# Patient Record
Sex: Female | Born: 1970 | Hispanic: Yes | Marital: Single | Smoking: Current every day smoker
Health system: Southern US, Community
[De-identification: ages and names within clinical notes are randomized; demographics above are authoritative.]

## PROBLEM LIST (undated history)

## (undated) DIAGNOSIS — G43909 Migraine, unspecified, not intractable, without status migrainosus: Secondary | ICD-10-CM

## (undated) DIAGNOSIS — E119 Type 2 diabetes mellitus without complications: Secondary | ICD-10-CM

## (undated) DIAGNOSIS — N6320 Unspecified lump in the left breast, unspecified quadrant: Secondary | ICD-10-CM

## (undated) DIAGNOSIS — E785 Hyperlipidemia, unspecified: Secondary | ICD-10-CM

## (undated) HISTORY — PX: FOOT GANGLION EXCISION: SHX1660

## (undated) HISTORY — DX: Type 2 diabetes mellitus without complications: E11.9

## (undated) HISTORY — DX: Hyperlipidemia, unspecified: E78.5

## (undated) HISTORY — PX: SPINAL CORD STIMULATOR IMPLANT: SHX2422

---

## 2017-05-28 ENCOUNTER — Encounter (HOSPITAL_COMMUNITY): Payer: Self-pay | Admitting: Emergency Medicine

## 2017-05-28 ENCOUNTER — Emergency Department (HOSPITAL_COMMUNITY)
Admission: EM | Admit: 2017-05-28 | Discharge: 2017-05-28 | Disposition: A | Discharge status: Home / Self Care | Payer: 59 | Attending: Emergency Medicine | Admitting: Emergency Medicine

## 2017-05-28 DIAGNOSIS — N6321 Unspecified lump in the left breast, upper outer quadrant: Secondary | ICD-10-CM | POA: Diagnosis not present

## 2017-05-28 DIAGNOSIS — N63 Unspecified lump in unspecified breast: Secondary | ICD-10-CM

## 2017-05-28 DIAGNOSIS — Z79899 Other long term (current) drug therapy: Secondary | ICD-10-CM | POA: Diagnosis not present

## 2017-05-28 DIAGNOSIS — F17210 Nicotine dependence, cigarettes, uncomplicated: Secondary | ICD-10-CM | POA: Diagnosis not present

## 2017-05-28 DIAGNOSIS — N644 Mastodynia: Secondary | ICD-10-CM | POA: Diagnosis present

## 2017-05-28 NOTE — ED Provider Notes (Signed)
[facility] DEPT Provider Note   CSN: [id number] Arrival date & time: 05/28/17  0827     History   Chief Complaint Chief Complaint  Patient presents with  . breast knot  . Breast Pain    HPI Jody Duke is a 46 y.o. female with history of tobacco abuse presents to the ED for left, tender breast lump that she noticed 2 days ago. Has tried ibuprofen with minimal relief. Also states that she's had irregular menstrual cycles 1 year, states now she gets her period twice a month with heavy bleeding and clots. Denies skin changes, discharge from nipple, fevers, chills, trauma. No known breast medical problems. Reports great grandmother had breast cancer. She has an appointment with a primary care provider in 2 weeks. No previous mammograms.  HPI  History reviewed. No pertinent past medical history.  There are no active problems to display for this patient.   History reviewed. No pertinent surgical history.  OB History    No data available       Home Medications    Prior to Admission medications   Medication Sig Start Date End Date Taking? Authorizing Provider  ibuprofen (ADVIL,MOTRIN) 800 MG tablet Take 800 mg by mouth every 8 (eight) hours as needed for fever, headache, mild pain, moderate pain or cramping.   Yes [provider]    Family History No family history on file.  Social History Social History   Tobacco Use  . Smoking status: Current Every Day Smoker    Types: Cigarettes  . Smokeless tobacco: Never Used  Substance Use Topics  . Alcohol use: No    Frequency: Never  . Drug use: Not on file     Allergies   Percocet [oxycodone-acetaminophen]   Review of Systems Review of Systems  Musculoskeletal:       Left breast pain and lump  All other systems reviewed and are negative.    Physical Exam Updated Vital Signs BP 113/73 (BP Location: Right Arm)   Pulse 95   Temp 98.5 F (36.9 C) (Oral)   Resp 16    LMP 05/16/2017   SpO2 99%   Physical Exam  Constitutional: She is oriented to person, place, and time. She appears well-developed and well-nourished. No distress.  NAD.  HENT:  Head: Normocephalic and atraumatic.  Right Ear: External ear normal.  Left Ear: External ear normal.  Nose: Nose normal.  Eyes: Conjunctivae and EOM are normal. No scleral icterus.  Neck: Normal range of motion. Neck supple.  Cardiovascular: Normal rate, regular rhythm and normal heart sounds.  No murmur heard. Pulmonary/Chest: Effort normal and breath sounds normal. She has no wheezes. She exhibits tenderness.  focal tenderness to left breast at 12:00 to 3:00 o'clock Two palpable, nonmobile lumps measuring 3x3 cm and 5x6 cm to this area No overlaying or surrounding erythema, edema, fluctuance, ecchymosis, or peau d'orange   Musculoskeletal: Normal range of motion. She exhibits no deformity.  Neurological: She is alert and oriented to person, place, and time.  Skin: Skin is warm and dry. Capillary refill takes less than 2 seconds.  Psychiatric: She has a normal mood and affect. Her behavior is normal. Judgment and thought content normal.  Nursing note and vitals reviewed.    ED Treatments / Results  Labs (all labs ordered are listed, but only abnormal results are displayed) Labs Reviewed - No data to display  EKG  EKG Interpretation None       Radiology No results found.  Procedures Procedures (including critical care time)  Medications Ordered in ED Medications - No data to display   Initial Impression / Assessment and Plan / ED Course  I have reviewed the triage vital signs and the nursing notes.  Pertinent labs & imaging results that were available during my care of the patient were reviewed by me and considered in my medical decision making (see chart for details).    46 year old female with history of tobacco abuse and family history of breast cancer presents with left breast pain  and palpable lumps that she noticed 2 days ago. No fevers, chills, unexpected weight loss, trauma, nipple discharge. Has not had a mammogram before. Exam is reassuring, she has to tender, nonmobile masses to the left breast without overlying skin changes. History and exam not consistent with breast abscess or cellulitis. Also having irregular menstrual cycle x 1 year, question pre menopausal changes and/or fibrocystic changes. Malignancy also considered given family h/o, tobacco abuse. We'll discharge at this time with NSAIDs and close follow up with PCP. Discussed return precautions.   Final Clinical Impressions(s) / ED Diagnoses   Final diagnoses:  Breast pain, left  Breast lump or mass    ED Discharge Orders    None       Arlean Hopping 05/28/17 1611    Lacretia Leigh, MD 06/02/17 [phone]

## 2017-05-28 NOTE — Discharge Instructions (Signed)
The cause of your breast lumps is unclear. It could be a benign cyst, scar tissue, cancer.   You need an outpatient provider (usually primary care doctor) to order an mammogram, which can be done at the breast center.   Follow up with your primary care provider on December 10th as scheduled, she/he can order the mammogram.   If you would like a specific OBGYN provider you can go to the [facility] center and establish care there ,a provider there can also order an MRI or further imaging.   For pain, take 600 mg ibuprofen + 1000 mg tylenol every 8 hours.   Return to ED for fevers, chills, skin changes to your breast

## 2017-05-28 NOTE — ED Triage Notes (Signed)
Patient reports that she found knot that is hard in left breast and having lots of pain in left breast for past couple days. Reports that she just getting set up with new PCP and doesn't have appointment for while.

## 2017-06-09 ENCOUNTER — Ambulatory Visit: Payer: Self-pay | Admitting: Family Medicine

## 2017-06-16 ENCOUNTER — Other Ambulatory Visit: Payer: Self-pay

## 2017-06-16 ENCOUNTER — Encounter: Payer: Self-pay | Admitting: Family Medicine

## 2017-06-16 ENCOUNTER — Ambulatory Visit (INDEPENDENT_AMBULATORY_CARE_PROVIDER_SITE_OTHER): Payer: 59

## 2017-06-16 ENCOUNTER — Ambulatory Visit (INDEPENDENT_AMBULATORY_CARE_PROVIDER_SITE_OTHER): Payer: 59 | Admitting: Family Medicine

## 2017-06-16 VITALS — BP 140/82 | HR 78 | Temp 98.0°F | Resp 16 | Wt 207.0 lb

## 2017-06-16 DIAGNOSIS — Z124 Encounter for screening for malignant neoplasm of cervix: Secondary | ICD-10-CM | POA: Diagnosis not present

## 2017-06-16 DIAGNOSIS — Z114 Encounter for screening for human immunodeficiency virus [HIV]: Secondary | ICD-10-CM | POA: Diagnosis not present

## 2017-06-16 DIAGNOSIS — N921 Excessive and frequent menstruation with irregular cycle: Secondary | ICD-10-CM

## 2017-06-16 DIAGNOSIS — N6320 Unspecified lump in the left breast, unspecified quadrant: Secondary | ICD-10-CM | POA: Diagnosis not present

## 2017-06-16 DIAGNOSIS — M25472 Effusion, left ankle: Secondary | ICD-10-CM

## 2017-06-16 DIAGNOSIS — R030 Elevated blood-pressure reading, without diagnosis of hypertension: Secondary | ICD-10-CM | POA: Diagnosis not present

## 2017-06-16 DIAGNOSIS — Z13220 Encounter for screening for lipoid disorders: Secondary | ICD-10-CM | POA: Diagnosis not present

## 2017-06-16 DIAGNOSIS — Z0000 Encounter for general adult medical examination without abnormal findings: Secondary | ICD-10-CM

## 2017-06-16 DIAGNOSIS — Z23 Encounter for immunization: Secondary | ICD-10-CM

## 2017-06-16 DIAGNOSIS — N852 Hypertrophy of uterus: Secondary | ICD-10-CM | POA: Diagnosis not present

## 2017-06-16 DIAGNOSIS — Z131 Encounter for screening for diabetes mellitus: Secondary | ICD-10-CM | POA: Diagnosis not present

## 2017-06-16 DIAGNOSIS — M7989 Other specified soft tissue disorders: Secondary | ICD-10-CM | POA: Diagnosis not present

## 2017-06-16 LAB — POCT URINALYSIS DIP (MANUAL ENTRY)
Bilirubin, UA: NEGATIVE
Glucose, UA: NEGATIVE mg/dL
Leukocytes, UA: NEGATIVE
Nitrite, UA: NEGATIVE
Protein Ur, POC: 100 mg/dL — AB
Spec Grav, UA: >=1.03 — AB (ref 1.010–1.025)
Urobilinogen, UA: 0.2 E.U./dL
pH, UA: 6 (ref 5.0–8.0)

## 2017-06-16 LAB — POCT URINE PREGNANCY: Preg Test, Ur: NEGATIVE

## 2017-06-16 NOTE — Progress Notes (Signed)
Subjective:    Patient ID: Jody Duke, female    DOB: [birth year], 46 y.o.   MRN: [id number]  06/16/2017  Establish Care (pt would like to talk to the provider about some health concerns ) and Breast Pain    HPI This 46 y.o. female presents for Complete Physical Examination and to establish care.  Last physical:  Age 67 Pap smear:  Age 68; LMP ended this week. Mammogram: never Eye exam:  Overdue; having problems with readings Dental exam:  Working on dental issues currently.  DUB: bleeding twice per month; really heavy; last pap smear age 81s.  L breast masses: has appreciated two breast masses; no previous mammogram; no family history of breast cancer.  L ankl no previous injury to left ankle.  Has chronic swelling posteriorly.  Swelling is very hard to palpation.  No redness.  Has significant pain especially at the end of work shift.  BP Readings from Last 3 Encounters:  06/16/17 140/82  05/28/17 113/73   Wt Readings from Last 3 Encounters:  06/16/17 207 lb (93.9 kg)   Immunization History  Administered Date(s) Administered  . Influenza-Unspecified 03/31/2017  . Tdap 06/16/2017    Review of Systems  Constitutional: Negative for activity change, appetite change, chills, diaphoresis, fatigue, fever and unexpected weight change.  HENT: Negative for congestion, dental problem, drooling, ear discharge, ear pain, facial swelling, hearing loss, mouth sores, nosebleeds, postnasal drip, rhinorrhea, sinus pressure, sneezing, sore throat, tinnitus, trouble swallowing and voice change.   Eyes: Negative for photophobia, pain, discharge, redness, itching and visual disturbance.  Respiratory: Negative for apnea, cough, choking, chest tightness, shortness of breath, wheezing and stridor.   Cardiovascular: Negative for chest pain, palpitations and leg swelling.  Gastrointestinal: Negative for abdominal distention, abdominal pain, anal bleeding, blood in stool, constipation, diarrhea,  nausea, rectal pain and vomiting.  Endocrine: Negative for cold intolerance, heat intolerance, polydipsia, polyphagia and polyuria.  Genitourinary: Positive for menstrual problem. Negative for decreased urine volume, difficulty urinating, dyspareunia, dysuria, enuresis, flank pain, frequency, genital sores, hematuria, pelvic pain, urgency, vaginal bleeding, vaginal discharge and vaginal pain.  Musculoskeletal: Positive for arthralgias and joint swelling. Negative for back pain, gait problem, myalgias, neck pain and neck stiffness.  Skin: Negative for color change, pallor, rash and wound.  Allergic/Immunologic: Negative for environmental allergies, food allergies and immunocompromised state.  Neurological: Negative for dizziness, tremors, seizures, syncope, facial asymmetry, speech difficulty, weakness, light-headedness, numbness and headaches.  Hematological: Negative for adenopathy. Does not bruise/bleed easily.  Psychiatric/Behavioral: Negative for agitation, behavioral problems, confusion, decreased concentration, dysphoric mood, hallucinations, self-injury, sleep disturbance and suicidal ideas. The patient is not nervous/anxious and is not hyperactive.     History reviewed. No pertinent past medical history. History reviewed. No pertinent surgical history. Allergies  Allergen Reactions  . Percocet [Oxycodone-Acetaminophen] Nausea And Vomiting   Current Outpatient Medications on File Prior to Visit  Medication Sig Dispense Refill  . ibuprofen (ADVIL,MOTRIN) 800 MG tablet Take 800 mg by mouth every 8 (eight) hours as needed for fever, headache, mild pain, moderate pain or cramping.     No current facility-administered medications on file prior to visit.    Social History   Socioeconomic History  . Marital status: Single    Spouse name: Not on file  . Number of children: Not on file  . Years of education: Not on file  . Highest education level: Not on file  Social Needs  . Financial  resource strain: Not on file  . Food insecurity -  worry: Not on file  . Food insecurity - inability: Not on file  . Transportation needs - medical: Not on file  . Transportation needs - non-medical: Not on file  Occupational History  . Not on file  Tobacco Use  . Smoking status: Current Every Day Smoker    Types: Cigarettes  . Smokeless tobacco: Never Used  Substance and Sexual Activity  . Alcohol use: Not on file  . Drug use: No  . Sexual activity: Yes  Other Topics Concern  . Not on file  Social History Narrative   Marital status: single; girlfriend x 3 years; from [place]; moved [place] in 2015      Children: none      Lives: with girlfriend      Employment: works at [employer]; runs seasonal department      Tobacco: 1.5ppd x age 51.  Never quit.      Alcohol: None; rare      Drugs: week as teenager      Exercise: none      Sexual activity: females since 59; males prior.  One STD/Gonorrhea.   Family History  Problem Relation Age of Onset  . Hyperlipidemia Mother   . Alcohol abuse Mother   . Cirrhosis Mother   . Diabetes Father   . Hyperlipidemia Father   . Hypertension Father   . Stroke Father   . Heart disease Father 70       CAD with stenting multiple  . Hyperlipidemia Brother   . Hypertension Brother        Objective:    BP 140/82   Pulse 78   Temp 98 F (36.7 C) (Oral)   Resp 16   Wt 207 lb (93.9 kg)   LMP 06/14/2017 Comment: pt states she has heavy bleeding   SpO2 98%  Physical Exam  Constitutional: She is oriented to person, place, and time. She appears well-developed and well-nourished. No distress.  HENT:  Head: Normocephalic and atraumatic.  Right Ear: External ear normal.  Left Ear: External ear normal.  Nose: Nose normal.  Mouth/Throat: Oropharynx is clear and moist.  Eyes: Conjunctivae and EOM are normal. Pupils are equal, round, and reactive to light.  Neck: Normal range of motion and full passive range of motion without pain. Neck supple. No  JVD present. Carotid bruit is not present. No thyromegaly present.  Cardiovascular: Normal rate, regular rhythm and normal heart sounds. Exam reveals no gallop and no friction rub.  No murmur heard. Pulmonary/Chest: Effort normal and breath sounds normal. She has no wheezes. She has no rales. Right breast exhibits no inverted nipple, no mass, no nipple discharge, no skin change and no tenderness. Left breast exhibits mass. Left breast exhibits no inverted nipple, no nipple discharge, no skin change and no tenderness. Breasts are symmetrical.    Abdominal: Soft. Bowel sounds are normal. She exhibits no distension and no mass. There is no tenderness. There is no rebound and no guarding.  Genitourinary: Vagina normal. There is no rash, tenderness, lesion or injury on the right labia. There is no rash, tenderness, lesion or injury on the left labia. Uterus is enlarged. Right adnexum displays no mass, no tenderness and no fullness. Left adnexum displays no mass and no fullness.  Musculoskeletal:       Right shoulder: Normal.       Left shoulder: Normal.       Cervical back: Normal.  Lymphadenopathy:    She has no cervical adenopathy.  Neurological: She  is alert and oriented to person, place, and time. She has normal reflexes. No cranial nerve deficit. She exhibits normal muscle tone. Coordination normal.  Skin: Skin is warm and dry. No rash noted. She is not diaphoretic. No erythema. No pallor.  Psychiatric: She has a normal mood and affect. Her behavior is normal. Judgment and thought content normal.  Nursing note and vitals reviewed.  No results found. Depression screen PHQ 2/9 06/16/2017  Decreased Interest 0  Down, Depressed, Hopeless 0  PHQ - 2 Score 0   Fall Risk  06/16/2017  Falls in the past year? No        Assessment & Plan:   1. Routine physical examination   2. Cervical cancer screening   3. Menometrorrhagia   4. Screening for HIV (human immunodeficiency virus)   5.  Screening for diabetes mellitus   6. Screening, lipid   7. Breast mass, left   8. Uterine enlargement   9. Left ankle swelling   10. Blood pressure elevated without history of HTN   11. Need for Tdap vaccination     -anticipatory guidance provided --- exercise, weight loss, safe driving practices. -obtain age appropriate screening labs and labs for chronic disease management. -pap smear obtained; having irregular menses; refer to gynecology for endometrial bx; uterine enlargement appreciated on exam most consistent with fibroids.  -New onset left breast mass.  Refer for diagnostic mammogram and breast ultrasound. -New onset left ankle swelling versus mass in the posterior joint space.  Obtain x-ray of left ankle.  Refer to orthopedics for further management. -New onset elevated blood pressure without previous diagnosis.  Recommend weight loss, exercise, low-sodium diet.  Return to clinic in 6 weeks for repeat blood pressure. -s/p TDAP.   Orders Placed This Encounter  Procedures  . US BREAST LTD UNI LEFT INC AXILLA    UHC/no needs/no hx of breast ca/no implants/af with pt Epic ORDER    Standing Status:   Future    Number of Occurrences:   1    Standing Expiration Date:   08/17/2018    Order Specific Question:   Reason for Exam (SYMPTOM  OR DIAGNOSIS REQUIRED)    Answer:   L breast mass/nodule at 1 oclock painful    Order Specific Question:   Preferred imaging location?    Answer:   [facility]  . DG Ankle Complete Left    Standing Status:   Future    Number of Occurrences:   1    Standing Expiration Date:   06/16/2018    Order Specific Question:   Reason for Exam (SYMPTOM  OR DIAGNOSIS REQUIRED)    Answer:   L ankle swelling and induration for ten years    Order Specific Question:   Is the patient pregnant?    Answer:   No    Order Specific Question:   Preferred imaging location?    Answer:   [facility]  . MM DIAG BREAST TOMO BILATERAL    UHC/no needs/no hx of breast ca/no  implants/af with pt   Cosign req 06/26/17     Standing Status:   Future    Number of Occurrences:   1    Standing Expiration Date:   08/27/2018    Order Specific Question:   Reason for Exam (SYMPTOM  OR DIAGNOSIS REQUIRED)    Answer:   L breast mass at 1 oclock    Order Specific Question:   Is the patient pregnant?    Answer:  No    Order Specific Question:   Preferred imaging location?    Answer:   [facility]  . Tdap vaccine greater than or equal to 7yo IM  . CBC with Differential/Platelet  . Comprehensive metabolic panel    Order Specific Question:   Has the patient fasted?    Answer:   No  . Hemoglobin A1c  . Lipid panel    Order Specific Question:   Has the patient fasted?    Answer:   No  . TSH  . HIV antibody  . Ambulatory referral to Gynecology    Referral Priority:   Routine    Referral Type:   Consultation    Referral Reason:   Specialty Services Required    Requested Specialty:   Gynecology    Number of Visits Requested:   1  . Care order/instruction:    Please recheck BP.  Marland Kitchen POCT urinalysis dipstick  . POCT urine pregnancy   No orders of the defined types were placed in this encounter.   Return in about 4 weeks (around 07/14/2017) for recheck.   Lexa Coronado Elayne Guerin, M.D. Primary Care at [facility] previously [facility] [address] [place], [place]  [address] [phone] phone [phone] fax

## 2017-06-16 NOTE — Patient Instructions (Addendum)
   IF you received an x-ray today, you will receive an invoice from [facility]. Please contact [facility] at [phone] with questions or concerns regarding your invoice.   IF you received labwork today, you will receive an invoice from [facility]. Please contact [facility] at [phone] with questions or concerns regarding your invoice.   Our billing staff will not be able to assist you with questions regarding bills from these companies.  You will be contacted with the lab results as soon as they are available. The fastest way to get your results is to activate your My Chart account. Instructions are located on the last page of this paperwork. If you have not heard from us regarding the results in 2 weeks, please contact this office.      Preventive Care 18-39 Years, Female Preventive care refers to lifestyle choices and visits with your health care provider that can promote health and wellness. What does preventive care include?  A yearly physical exam. This is also called an annual well check.  Dental exams once or twice a year.  Routine eye exams. Ask your health care provider how often you should have your eyes checked.  Personal lifestyle choices, including: ? Daily care of your teeth and gums. ? Regular physical activity. ? Eating a healthy diet. ? Avoiding tobacco and drug use. ? Limiting alcohol use. ? Practicing safe sex. ? Taking vitamin and mineral supplements as recommended by your health care provider. What happens during an annual well check? The services and screenings done by your health care provider during your annual well check will depend on your age, overall health, lifestyle risk factors, and family history of disease. Counseling Your health care provider may ask you questions about your:  Alcohol use.  Tobacco use.  Drug use.  Emotional well-being.  Home and relationship well-being.  Sexual activity.  Eating  habits.  Work and work environment.  Method of birth control.  Menstrual cycle.  Pregnancy history.  Screening You may have the following tests or measurements:  Height, weight, and BMI.  Diabetes screening. This is done by checking your blood sugar (glucose) after you have not eaten for a while (fasting).  Blood pressure.  Lipid and cholesterol levels. These may be checked every 5 years starting at age 20.  Skin check.  Hepatitis C blood test.  Hepatitis B blood test.  Sexually transmitted disease (STD) testing.  BRCA-related cancer screening. This may be done if you have a family history of breast, ovarian, tubal, or peritoneal cancers.  Pelvic exam and Pap test. This may be done every 3 years starting at age 21. Starting at age 30, this may be done every 5 years if you have a Pap test in combination with an HPV test.  Discuss your test results, treatment options, and if necessary, the need for more tests with your health care provider. Vaccines Your health care provider may recommend certain vaccines, such as:  Influenza vaccine. This is recommended every year.  Tetanus, diphtheria, and acellular pertussis (Tdap, Td) vaccine. You may need a Td booster every 10 years.  Varicella vaccine. You may need this if you have not been vaccinated.  HPV vaccine. If you are 26 or younger, you may need three doses over 6 months.  Measles, mumps, and rubella (MMR) vaccine. You may need at least one dose of MMR. You may also need a second dose.  Pneumococcal 13-valent conjugate (PCV13) vaccine. You may need this if you have certain conditions   conditions and were not previously vaccinated.  Pneumococcal polysaccharide (PPSV23) vaccine. You may need one or two doses if you smoke cigarettes or if you have certain conditions.  Meningococcal vaccine. One dose is recommended if you are age 19-21 years and a first-year college student living in a residence hall, or if you have one of several  medical conditions. You may also need additional booster doses.  Hepatitis A vaccine. You may need this if you have certain conditions or if you travel or work in places where you may be exposed to hepatitis A.  Hepatitis B vaccine. You may need this if you have certain conditions or if you travel or work in places where you may be exposed to hepatitis B.  Haemophilus influenzae type b (Hib) vaccine. You may need this if you have certain risk factors. Talk to your health care provider about which screenings and vaccines you need and how often you need them. This information is not intended to replace advice given to you by your health care provider. Make sure you discuss any questions you have with your health care provider. Document Released: 08/13/2001 Document Revised: 03/06/2016 Document Reviewed: 04/18/2015 Elsevier Interactive Patient Education  2017 Elsevier Inc.  

## 2017-06-17 ENCOUNTER — Encounter: Payer: Self-pay | Admitting: Family Medicine

## 2017-06-17 ENCOUNTER — Other Ambulatory Visit: Payer: Self-pay | Admitting: Family Medicine

## 2017-06-17 DIAGNOSIS — R2242 Localized swelling, mass and lump, left lower limb: Secondary | ICD-10-CM

## 2017-06-17 LAB — COMPREHENSIVE METABOLIC PANEL
ALT: 32 IU/L (ref 0–32)
AST: 21 IU/L (ref 0–40)
Albumin/Globulin Ratio: 1.8 (ref 1.2–2.2)
Albumin: 4.2 g/dL (ref 3.5–5.5)
Alkaline Phosphatase: 67 IU/L (ref 39–117)
BUN/Creatinine Ratio: 25 — ABNORMAL HIGH (ref 9–23)
BUN: 14 mg/dL (ref 6–24)
Bilirubin Total: 0.6 mg/dL (ref 0.0–1.2)
CO2: 22 mmol/L (ref 20–29)
Calcium: 9.4 mg/dL (ref 8.7–10.2)
Chloride: 105 mmol/L (ref 96–106)
Creatinine, Ser: 0.57 mg/dL (ref 0.57–1.00)
GFR calc Af Amer: 130 mL/min/{1.73_m2} (ref >59)
GFR calc non Af Amer: 113 mL/min/{1.73_m2} (ref >59)
Globulin, Total: 2.4 g/dL (ref 1.5–4.5)
Glucose: 108 mg/dL — ABNORMAL HIGH (ref 65–99)
Potassium: 4 mmol/L (ref 3.5–5.2)
Sodium: 141 mmol/L (ref 134–144)
Total Protein: 6.6 g/dL (ref 6.0–8.5)

## 2017-06-17 LAB — LIPID PANEL
Chol/HDL Ratio: 5.9 ratio — ABNORMAL HIGH (ref 0.0–4.4)
Cholesterol, Total: 283 mg/dL — ABNORMAL HIGH (ref 100–199)
HDL: 48 mg/dL (ref >39)
LDL Calculated: 188 mg/dL — ABNORMAL HIGH (ref 0–99)
Triglycerides: 234 mg/dL — ABNORMAL HIGH (ref 0–149)
VLDL Cholesterol Cal: 47 mg/dL — ABNORMAL HIGH (ref 5–40)

## 2017-06-17 LAB — CBC WITH DIFFERENTIAL/PLATELET
Basophils Absolute: 0.1 10*3/uL (ref 0.0–0.2)
Basos: 1 %
EOS (ABSOLUTE): 0.1 10*3/uL (ref 0.0–0.4)
Eos: 1 %
Hematocrit: 37.5 % (ref 34.0–46.6)
Hemoglobin: 12.9 g/dL (ref 11.1–15.9)
Immature Grans (Abs): 0 10*3/uL (ref 0.0–0.1)
Immature Granulocytes: 0 %
Lymphocytes Absolute: 3.7 10*3/uL — ABNORMAL HIGH (ref 0.7–3.1)
Lymphs: 31 %
MCH: 31.5 pg (ref 26.6–33.0)
MCHC: 34.4 g/dL (ref 31.5–35.7)
MCV: 92 fL (ref 79–97)
Monocytes Absolute: 0.9 10*3/uL (ref 0.1–0.9)
Monocytes: 7 %
Neutrophils Absolute: 7.3 10*3/uL — ABNORMAL HIGH (ref 1.4–7.0)
Neutrophils: 60 %
Platelets: 371 10*3/uL (ref 150–379)
RBC: 4.1 x10E6/uL (ref 3.77–5.28)
RDW: 13.4 % (ref 12.3–15.4)
WBC: 12.1 10*3/uL — ABNORMAL HIGH (ref 3.4–10.8)

## 2017-06-17 LAB — TSH: TSH: 2.08 u[IU]/mL (ref 0.450–4.500)

## 2017-06-17 LAB — HIV ANTIBODY (ROUTINE TESTING W REFLEX): HIV Screen 4th Generation wRfx: NONREACTIVE

## 2017-06-17 LAB — HEMOGLOBIN A1C
Est. average glucose Bld gHb Est-mCnc: 146 mg/dL
Hgb A1c MFr Bld: 6.7 % — ABNORMAL HIGH (ref 4.8–5.6)

## 2017-06-19 LAB — PAP IG, CT-NG NAA, HPV HIGH-RISK
Chlamydia, Nuc. Acid Amp: NEGATIVE
Gonococcus by Nucleic Acid Amp: NEGATIVE
HPV, high-risk: NEGATIVE
PAP Smear Comment: 0

## 2017-06-23 ENCOUNTER — Encounter: Payer: Self-pay | Admitting: Family Medicine

## 2017-06-25 ENCOUNTER — Ambulatory Visit
Admission: RE | Admit: 2017-06-25 | Discharge: 2017-06-25 | Disposition: A | Discharge status: Home / Self Care | Payer: 59 | Source: Ambulatory Visit | Attending: Family Medicine | Admitting: Family Medicine

## 2017-06-25 DIAGNOSIS — R2242 Localized swelling, mass and lump, left lower limb: Secondary | ICD-10-CM

## 2017-06-25 MED ORDER — GADOBENATE DIMEGLUMINE 529 MG/ML IV SOLN
20.0000 mL | Freq: Once | INTRAVENOUS | Status: AC | PRN
  Administered 2017-06-25: 20 mL via INTRAVENOUS

## 2017-06-30 ENCOUNTER — Encounter: Payer: Self-pay | Admitting: Family Medicine

## 2017-06-30 ENCOUNTER — Other Ambulatory Visit: Payer: Self-pay | Admitting: Family Medicine

## 2017-06-30 DIAGNOSIS — R2242 Localized swelling, mass and lump, left lower limb: Secondary | ICD-10-CM

## 2017-06-30 NOTE — Progress Notes (Signed)
re

## 2017-07-01 DIAGNOSIS — N6320 Unspecified lump in the left breast, unspecified quadrant: Secondary | ICD-10-CM

## 2017-07-01 HISTORY — DX: Unspecified lump in the left breast, unspecified quadrant: N63.20

## 2017-07-02 ENCOUNTER — Ambulatory Visit
Admission: RE | Admit: 2017-07-02 | Discharge: 2017-07-02 | Disposition: A | Discharge status: Home / Self Care | Payer: 59 | Source: Ambulatory Visit | Attending: Family Medicine | Admitting: Family Medicine

## 2017-07-02 ENCOUNTER — Other Ambulatory Visit: Payer: Self-pay | Admitting: Family Medicine

## 2017-07-02 DIAGNOSIS — N6320 Unspecified lump in the left breast, unspecified quadrant: Secondary | ICD-10-CM

## 2017-07-02 DIAGNOSIS — N63 Unspecified lump in unspecified breast: Secondary | ICD-10-CM

## 2017-07-02 DIAGNOSIS — N6310 Unspecified lump in the right breast, unspecified quadrant: Secondary | ICD-10-CM

## 2017-07-02 DIAGNOSIS — R922 Inconclusive mammogram: Secondary | ICD-10-CM | POA: Diagnosis not present

## 2017-07-02 DIAGNOSIS — N6321 Unspecified lump in the left breast, upper outer quadrant: Secondary | ICD-10-CM | POA: Diagnosis not present

## 2017-07-02 DIAGNOSIS — N6311 Unspecified lump in the right breast, upper outer quadrant: Secondary | ICD-10-CM | POA: Diagnosis not present

## 2017-07-04 ENCOUNTER — Ambulatory Visit
Admission: RE | Admit: 2017-07-04 | Discharge: 2017-07-04 | Disposition: A | Discharge status: Home / Self Care | Payer: 59 | Source: Ambulatory Visit | Attending: Family Medicine | Admitting: Family Medicine

## 2017-07-04 DIAGNOSIS — N6311 Unspecified lump in the right breast, upper outer quadrant: Secondary | ICD-10-CM | POA: Diagnosis not present

## 2017-07-04 DIAGNOSIS — N6320 Unspecified lump in the left breast, unspecified quadrant: Secondary | ICD-10-CM

## 2017-07-04 DIAGNOSIS — N6310 Unspecified lump in the right breast, unspecified quadrant: Secondary | ICD-10-CM

## 2017-07-04 DIAGNOSIS — N649 Disorder of breast, unspecified: Secondary | ICD-10-CM | POA: Diagnosis not present

## 2017-07-04 DIAGNOSIS — N6321 Unspecified lump in the left breast, upper outer quadrant: Secondary | ICD-10-CM | POA: Diagnosis not present

## 2017-07-04 MED ORDER — TRAMADOL HCL 50 MG PO TABS: 50.0000 mg | ORAL_TABLET | Freq: Three times a day (TID) | ORAL | 0 refills | Status: DC | PRN

## 2017-07-14 ENCOUNTER — Ambulatory Visit: Payer: 59 | Admitting: Gynecology

## 2017-07-14 DIAGNOSIS — R2242 Localized swelling, mass and lump, left lower limb: Secondary | ICD-10-CM | POA: Diagnosis not present

## 2017-07-15 ENCOUNTER — Other Ambulatory Visit: Payer: Self-pay | Admitting: General Surgery

## 2017-07-15 DIAGNOSIS — N6320 Unspecified lump in the left breast, unspecified quadrant: Secondary | ICD-10-CM | POA: Diagnosis not present

## 2017-07-17 ENCOUNTER — Encounter (HOSPITAL_BASED_OUTPATIENT_CLINIC_OR_DEPARTMENT_OTHER): Payer: Self-pay | Admitting: *Deleted

## 2017-07-17 ENCOUNTER — Other Ambulatory Visit: Payer: Self-pay

## 2017-07-17 NOTE — Pre-Procedure Instructions (Signed)
To come pick up Ensure pre-surgery drink 10 oz. - to drink by 0945 DOS. 

## 2017-07-22 NOTE — Pre-Procedure Instructions (Signed)
Ensure pre-surgery drink 10 oz. given to pt. with instructions to drink by 0945 DOS.  Pt. verbalized understanding.

## 2017-07-24 ENCOUNTER — Ambulatory Visit (HOSPITAL_BASED_OUTPATIENT_CLINIC_OR_DEPARTMENT_OTHER): Payer: 59 | Admitting: Anesthesiology

## 2017-07-24 ENCOUNTER — Ambulatory Visit (HOSPITAL_BASED_OUTPATIENT_CLINIC_OR_DEPARTMENT_OTHER)
Admission: RE | Admit: 2017-07-24 | Discharge: 2017-07-24 | Disposition: A | Discharge status: Home / Self Care | Payer: 59 | Source: Ambulatory Visit | Attending: General Surgery | Admitting: General Surgery

## 2017-07-24 ENCOUNTER — Encounter (HOSPITAL_BASED_OUTPATIENT_CLINIC_OR_DEPARTMENT_OTHER): Payer: Self-pay | Admitting: *Deleted

## 2017-07-24 ENCOUNTER — Other Ambulatory Visit: Payer: Self-pay

## 2017-07-24 ENCOUNTER — Encounter (HOSPITAL_BASED_OUTPATIENT_CLINIC_OR_DEPARTMENT_OTHER)
Admission: RE | Disposition: A | Discharge status: Home / Self Care | Payer: Self-pay | Source: Ambulatory Visit | Attending: General Surgery

## 2017-07-24 DIAGNOSIS — N6320 Unspecified lump in the left breast, unspecified quadrant: Secondary | ICD-10-CM | POA: Diagnosis present

## 2017-07-24 DIAGNOSIS — Z6839 Body mass index (BMI) 39.0-39.9, adult: Secondary | ICD-10-CM | POA: Insufficient documentation

## 2017-07-24 DIAGNOSIS — F17200 Nicotine dependence, unspecified, uncomplicated: Secondary | ICD-10-CM | POA: Insufficient documentation

## 2017-07-24 DIAGNOSIS — D242 Benign neoplasm of left breast: Secondary | ICD-10-CM | POA: Diagnosis not present

## 2017-07-24 DIAGNOSIS — Z885 Allergy status to narcotic agent status: Secondary | ICD-10-CM | POA: Insufficient documentation

## 2017-07-24 DIAGNOSIS — D4862 Neoplasm of uncertain behavior of left breast: Secondary | ICD-10-CM | POA: Insufficient documentation

## 2017-07-24 HISTORY — PX: BREAST BIOPSY: SHX20

## 2017-07-24 HISTORY — DX: Unspecified lump in the left breast, unspecified quadrant: N63.20

## 2017-07-24 HISTORY — DX: Migraine, unspecified, not intractable, without status migrainosus: G43.909

## 2017-07-24 SURGERY — BREAST BIOPSY
Anesthesia: General | Site: Breast | Laterality: Left

## 2017-07-24 MED ORDER — LIDOCAINE HCL (CARDIAC) 20 MG/ML IV SOLN
INTRAVENOUS | Status: DC | PRN
  Administered 2017-07-24: 100 mg via INTRAVENOUS

## 2017-07-24 MED ORDER — PROMETHAZINE HCL 25 MG/ML IJ SOLN
INTRAMUSCULAR | Status: AC
Start: 2017-07-24 — End: 2017-07-24
  Filled 2017-07-24: qty 1

## 2017-07-24 MED ORDER — DEXAMETHASONE SODIUM PHOSPHATE 4 MG/ML IJ SOLN
INTRAMUSCULAR | Status: DC | PRN
  Administered 2017-07-24: 10 mg via INTRAVENOUS

## 2017-07-24 MED ORDER — GABAPENTIN 300 MG PO CAPS
ORAL_CAPSULE | ORAL | Status: AC
Start: 2017-07-24 — End: 2017-07-24
  Filled 2017-07-24: qty 1

## 2017-07-24 MED ORDER — ENSURE PRE-SURGERY PO LIQD: 592.0000 mL | Freq: Once | ORAL | Status: DC

## 2017-07-24 MED ORDER — CEFAZOLIN SODIUM-DEXTROSE 2-4 GM/100ML-% IV SOLN
INTRAVENOUS | Status: AC
  Filled 2017-07-24: qty 100

## 2017-07-24 MED ORDER — ACETAMINOPHEN 500 MG PO TABS
ORAL_TABLET | ORAL | Status: AC
  Filled 2017-07-24: qty 2

## 2017-07-24 MED ORDER — ONDANSETRON HCL 4 MG/2ML IJ SOLN
INTRAMUSCULAR | Status: AC
Start: 2017-07-24 — End: 2017-07-24
  Filled 2017-07-24: qty 2

## 2017-07-24 MED ORDER — BUPIVACAINE HCL (PF) 0.25 % IJ SOLN
INTRAMUSCULAR | Status: DC | PRN
  Administered 2017-07-24: 10 mL

## 2017-07-24 MED ORDER — FENTANYL CITRATE (PF) 100 MCG/2ML IJ SOLN
INTRAMUSCULAR | Status: AC
  Filled 2017-07-24: qty 2

## 2017-07-24 MED ORDER — ACETAMINOPHEN 500 MG PO TABS
1000.0000 mg | ORAL_TABLET | ORAL | Status: AC
  Administered 2017-07-24: 1000 mg via ORAL

## 2017-07-24 MED ORDER — LIDOCAINE 2% (20 MG/ML) 5 ML SYRINGE
INTRAMUSCULAR | Status: AC
  Filled 2017-07-24: qty 5

## 2017-07-24 MED ORDER — CEFAZOLIN SODIUM-DEXTROSE 2-4 GM/100ML-% IV SOLN
2.0000 g | INTRAVENOUS | Status: AC
  Administered 2017-07-24: 2 g via INTRAVENOUS

## 2017-07-24 MED ORDER — CELECOXIB 200 MG PO CAPS
ORAL_CAPSULE | ORAL | Status: AC
Start: 2017-07-24 — End: 2017-07-24
  Filled 2017-07-24: qty 2

## 2017-07-24 MED ORDER — ONDANSETRON HCL 4 MG/2ML IJ SOLN
INTRAMUSCULAR | Status: DC | PRN
  Administered 2017-07-24: 4 mg via INTRAVENOUS

## 2017-07-24 MED ORDER — FENTANYL CITRATE (PF) 100 MCG/2ML IJ SOLN
25.0000 ug | INTRAMUSCULAR | Status: DC | PRN
  Administered 2017-07-24: 25 ug via INTRAVENOUS
  Administered 2017-07-24: 50 ug via INTRAVENOUS
  Administered 2017-07-24: 25 ug via INTRAVENOUS

## 2017-07-24 MED ORDER — PHENYLEPHRINE 40 MCG/ML (10ML) SYRINGE FOR IV PUSH (FOR BLOOD PRESSURE SUPPORT)
PREFILLED_SYRINGE | INTRAVENOUS | Status: AC
Start: 2017-07-24 — End: 2017-07-24
  Filled 2017-07-24: qty 10

## 2017-07-24 MED ORDER — LACTATED RINGERS IV SOLN
INTRAVENOUS | Status: DC
  Administered 2017-07-24: 11:00:00 via INTRAVENOUS

## 2017-07-24 MED ORDER — MIDAZOLAM HCL 2 MG/2ML IJ SOLN
1.0000 mg | INTRAMUSCULAR | Status: DC | PRN
  Administered 2017-07-24: 2 mg via INTRAVENOUS

## 2017-07-24 MED ORDER — PHENYLEPHRINE HCL 10 MG/ML IJ SOLN
INTRAMUSCULAR | Status: DC | PRN
  Administered 2017-07-24: 80 ug via INTRAVENOUS

## 2017-07-24 MED ORDER — CELECOXIB 400 MG PO CAPS
400.0000 mg | ORAL_CAPSULE | ORAL | Status: AC
  Administered 2017-07-24: 400 mg via ORAL

## 2017-07-24 MED ORDER — FENTANYL CITRATE (PF) 100 MCG/2ML IJ SOLN
50.0000 ug | INTRAMUSCULAR | Status: AC | PRN
  Administered 2017-07-24 (×4): 25 ug via INTRAVENOUS

## 2017-07-24 MED ORDER — DEXAMETHASONE SODIUM PHOSPHATE 10 MG/ML IJ SOLN
INTRAMUSCULAR | Status: AC
  Filled 2017-07-24: qty 1

## 2017-07-24 MED ORDER — MIDAZOLAM HCL 2 MG/2ML IJ SOLN
INTRAMUSCULAR | Status: AC
  Filled 2017-07-24: qty 2

## 2017-07-24 MED ORDER — PROPOFOL 10 MG/ML IV BOLUS
INTRAVENOUS | Status: DC | PRN
  Administered 2017-07-24: 200 mg via INTRAVENOUS

## 2017-07-24 MED ORDER — PROMETHAZINE HCL 25 MG/ML IJ SOLN
6.2500 mg | INTRAMUSCULAR | Status: DC | PRN
  Administered 2017-07-24: 6.25 mg via INTRAVENOUS

## 2017-07-24 MED ORDER — SCOPOLAMINE 1 MG/3DAYS TD PT72: 1.0000 | MEDICATED_PATCH | Freq: Once | TRANSDERMAL | Status: DC | PRN

## 2017-07-24 MED ORDER — GABAPENTIN 300 MG PO CAPS
300.0000 mg | ORAL_CAPSULE | ORAL | Status: AC
  Administered 2017-07-24: 300 mg via ORAL

## 2017-07-24 SURGICAL SUPPLY — 58 items
APPLIER CLIP 9.375 MED OPEN (MISCELLANEOUS)
BINDER BREAST LRG (GAUZE/BANDAGES/DRESSINGS) IMPLANT
BINDER BREAST MEDIUM (GAUZE/BANDAGES/DRESSINGS) IMPLANT
BINDER BREAST XLRG (GAUZE/BANDAGES/DRESSINGS) ×3 IMPLANT
BINDER BREAST XXLRG (GAUZE/BANDAGES/DRESSINGS) IMPLANT
BLADE SURG 15 STRL LF DISP TIS (BLADE) ×1 IMPLANT
BLADE SURG 15 STRL SS (BLADE) ×2
CANISTER SUCT 1200ML W/VALVE (MISCELLANEOUS) IMPLANT
CHLORAPREP W/TINT 26ML (MISCELLANEOUS) ×3 IMPLANT
CLIP APPLIE 9.375 MED OPEN (MISCELLANEOUS) IMPLANT
CLIP VESOCCLUDE SM WIDE 6/CT (CLIP) ×3 IMPLANT
CLOSURE WOUND 1/2 X4 (GAUZE/BANDAGES/DRESSINGS) ×1
COVER BACK TABLE 60X90IN (DRAPES) ×3 IMPLANT
COVER MAYO STAND STRL (DRAPES) ×3 IMPLANT
DECANTER SPIKE VIAL GLASS SM (MISCELLANEOUS) IMPLANT
DERMABOND ADVANCED (GAUZE/BANDAGES/DRESSINGS) ×2
DERMABOND ADVANCED .7 DNX12 (GAUZE/BANDAGES/DRESSINGS) ×1 IMPLANT
DEVICE DUBIN W/COMP PLATE 8390 (MISCELLANEOUS) IMPLANT
DRAPE LAPAROSCOPIC ABDOMINAL (DRAPES) ×3 IMPLANT
DRAPE UTILITY XL STRL (DRAPES) ×3 IMPLANT
DRSG TEGADERM 4X4.75 (GAUZE/BANDAGES/DRESSINGS) IMPLANT
ELECT COATED BLADE 2.86 ST (ELECTRODE) ×3 IMPLANT
ELECT REM PT RETURN 9FT ADLT (ELECTROSURGICAL) ×3
ELECTRODE REM PT RTRN 9FT ADLT (ELECTROSURGICAL) ×1 IMPLANT
GAUZE SPONGE 4X4 12PLY STRL LF (GAUZE/BANDAGES/DRESSINGS) ×3 IMPLANT
GLOVE BIO SURGEON STRL SZ7 (GLOVE) ×6 IMPLANT
GLOVE BIOGEL PI IND STRL 6.5 (GLOVE) ×1 IMPLANT
GLOVE BIOGEL PI IND STRL 7.0 (GLOVE) ×2 IMPLANT
GLOVE BIOGEL PI IND STRL 7.5 (GLOVE) ×2 IMPLANT
GLOVE BIOGEL PI INDICATOR 6.5 (GLOVE) ×2
GLOVE BIOGEL PI INDICATOR 7.0 (GLOVE) ×4
GLOVE BIOGEL PI INDICATOR 7.5 (GLOVE) ×4
GLOVE ECLIPSE 6.5 STRL STRAW (GLOVE) ×3 IMPLANT
GOWN STRL REUS W/ TWL LRG LVL3 (GOWN DISPOSABLE) ×3 IMPLANT
GOWN STRL REUS W/TWL LRG LVL3 (GOWN DISPOSABLE) ×6
HEMOSTAT ARISTA ABSORB 3G PWDR (MISCELLANEOUS) IMPLANT
ILLUMINATOR WAVEGUIDE N/F (MISCELLANEOUS) IMPLANT
LIGHT WAVEGUIDE WIDE FLAT (MISCELLANEOUS) IMPLANT
NEEDLE HYPO 25X1 1.5 SAFETY (NEEDLE) ×3 IMPLANT
NS IRRIG 1000ML POUR BTL (IV SOLUTION) IMPLANT
PACK BASIN DAY SURGERY FS (CUSTOM PROCEDURE TRAY) ×3 IMPLANT
PENCIL BUTTON HOLSTER BLD 10FT (ELECTRODE) ×3 IMPLANT
SLEEVE SCD COMPRESS KNEE MED (MISCELLANEOUS) ×3 IMPLANT
SPONGE LAP 4X18 X RAY DECT (DISPOSABLE) ×3 IMPLANT
STRIP CLOSURE SKIN 1/2X4 (GAUZE/BANDAGES/DRESSINGS) ×2 IMPLANT
SUT MNCRL AB 4-0 PS2 18 (SUTURE) IMPLANT
SUT MON AB 5-0 PS2 18 (SUTURE) ×3 IMPLANT
SUT SILK 2 0 SH (SUTURE) ×3 IMPLANT
SUT VIC AB 2-0 SH 27 (SUTURE) ×2
SUT VIC AB 2-0 SH 27XBRD (SUTURE) ×1 IMPLANT
SUT VIC AB 3-0 SH 27 (SUTURE) ×2
SUT VIC AB 3-0 SH 27X BRD (SUTURE) ×1 IMPLANT
SYR CONTROL 10ML LL (SYRINGE) ×3 IMPLANT
TOWEL OR 17X24 6PK STRL BLUE (TOWEL DISPOSABLE) ×3 IMPLANT
TOWEL OR NON WOVEN STRL DISP B (DISPOSABLE) ×3 IMPLANT
TUBE CONNECTING 20'X1/4 (TUBING)
TUBE CONNECTING 20X1/4 (TUBING) IMPLANT
YANKAUER SUCT BULB TIP NO VENT (SUCTIONS) IMPLANT

## 2017-07-24 NOTE — Anesthesia Preprocedure Evaluation (Signed)
Anesthesia Evaluation  Patient identified by MRN, date of birth, ID band Patient awake    Reviewed: Allergy & Precautions, NPO status , Patient's Chart, lab work & pertinent test results  Airway Mallampati: II  TM Distance: >3 FB Neck ROM: Full    Dental  (+) Teeth Intact, Dental Advisory Given   Pulmonary Current Smoker,    Pulmonary exam normal breath sounds clear to auscultation       Cardiovascular Exercise Tolerance: Good negative cardio ROS Normal cardiovascular exam Rhythm:Regular Rate:Normal     Neuro/Psych  Headaches, negative psych ROS   GI/Hepatic negative GI ROS, Neg liver ROS,   Endo/Other  Morbid obesity  Renal/GU negative Renal ROS     Musculoskeletal negative musculoskeletal ROS (+)   Abdominal   Peds  Hematology negative hematology ROS (+)   Anesthesia Other Findings Day of surgery medications reviewed with the patient.  Left breast mass  Reproductive/Obstetrics                             Anesthesia Physical Anesthesia Plan  ASA: III  Anesthesia Plan: General   Post-op Pain Management:    Induction: Intravenous  PONV Risk Score and Plan: 2 and Dexamethasone and Ondansetron  Airway Management Planned: LMA  Additional Equipment:   Intra-op Plan:   Post-operative Plan: Extubation in OR  Informed Consent: I have reviewed the patients History and Physical, chart, labs and discussed the procedure including the risks, benefits and alternatives for the proposed anesthesia with the patient or authorized representative who has indicated his/her understanding and acceptance.   Dental advisory given  Plan Discussed with: CRNA  Anesthesia Plan Comments: (Risks/benefits of general anesthesia discussed with patient including risk of damage to teeth, lips, gum, and tongue, nausea/vomiting, allergic reactions to medications, and the possibility of heart attack, stroke  and death.  All patient questions answered.  Patient wishes to proceed.)        Anesthesia Quick Evaluation

## 2017-07-24 NOTE — Anesthesia Postprocedure Evaluation (Signed)
Anesthesia Post Note  Patient: Jody Duke  Procedure(s) Performed: LEFT BREAST MASS EXCISIONAL BIOPSY (Left Breast)     Patient location during evaluation: PACU Anesthesia Type: General Level of consciousness: awake and alert Pain management: pain level controlled Vital Signs Assessment: post-procedure vital signs reviewed and stable Respiratory status: spontaneous breathing, nonlabored ventilation and respiratory function stable Cardiovascular status: blood pressure returned to baseline and stable Postop Assessment: no apparent nausea or vomiting Anesthetic complications: no    Last Vitals:  Vitals:   07/24/17 1345 07/24/17 1400  BP: 103/65 118/67  Pulse: 66 64  Resp: 20 20  Temp:    SpO2: 97% 100%    Last Pain:  Vitals:   07/24/17 1400  TempSrc:   PainSc: 2                  Catalina Gravel

## 2017-07-24 NOTE — Transfer of Care (Signed)
Immediate Anesthesia Transfer of Care Note  Patient: Jody Duke  Procedure(s) Performed: LEFT BREAST MASS EXCISIONAL BIOPSY (Left Breast)  Patient Location: PACU  Anesthesia Type:General  Level of Consciousness: awake and sedated  Airway & Oxygen Therapy: Patient Spontanous Breathing and Patient connected to face mask oxygen  Post-op Assessment: Report given to RN and Post -op Vital signs reviewed and stable  Post vital signs: Reviewed and stable  Last Vitals:  Vitals:   07/24/17 1011  BP: 139/77  Pulse: 64  Resp: 18  Temp: 36.8 C  SpO2: 100%    Last Pain:  Vitals:   07/24/17 1011  TempSrc: Oral  PainSc: 7       Patients Stated Pain Goal: 4 (32/67/12 4580)  Complications: No apparent anesthesia complications

## 2017-07-24 NOTE — Anesthesia Procedure Notes (Signed)
Procedure Name: LMA Insertion Performed by: Terrance Mass, CRNA Pre-anesthesia Checklist: Patient identified, Suction available, Emergency Drugs available, Patient being monitored and Timeout performed Preoxygenation: Pre-oxygenation with 100% oxygen Induction Type: IV induction LMA: LMA inserted LMA Size: 4.0 Tube type: Oral Number of attempts: 1 Placement Confirmation: positive ETCO2,  CO2 detector and breath sounds checked- equal and bilateral Tube secured with: Tape Dental Injury: Teeth and Oropharynx as per pre-operative assessment

## 2017-07-24 NOTE — Discharge Instructions (Signed)
[facility] °Office Phone Number [phone] ° °POST OP INSTRUCTIONS ° °Always review your discharge instruction sheet given to you by the facility where your surgery was performed. ° °IF YOU HAVE DISABILITY OR FAMILY LEAVE FORMS, YOU MUST BRING THEM TO THE OFFICE FOR PROCESSING.  DO NOT GIVE THEM TO YOUR DOCTOR. ° °1. A prescription for pain medication may be given to you upon discharge.  Take your pain medication as prescribed, if needed.  If narcotic pain medicine is not needed, then you may take acetaminophen (Tylenol), naprosyn (Alleve) or ibuprofen (Advil) as needed. °2. Take your usually prescribed medications unless otherwise directed °3. If you need a refill on your pain medication, please contact your pharmacy.  They will contact our office to request authorization.  Prescriptions will not be filled after 5pm or on week-ends. °4. You should eat very light the first 24 hours after surgery, such as soup, crackers, pudding, etc.  Resume your normal diet the day after surgery. °5. Most patients will experience some swelling and bruising in the breast.  Ice packs and a good support bra will help.  Wear the breast binder provided or a sports bra for 72 hours day and night.  After that wear a sports bra during the day until you return to the office. Swelling and bruising can take several days to resolve.  °6. It is common to experience some constipation if taking pain medication after surgery.  Increasing fluid intake and taking a stool softener will usually help or prevent this problem from occurring.  A mild laxative (Milk of Magnesia or Miralax) should be taken according to package directions if there are no bowel movements after 48 hours. °7. Unless discharge instructions indicate otherwise, you may remove your bandages 48 hours after surgery and you may shower at that time.  You may have steri-strips (small skin tapes) in place directly over the incision.  These strips should be left on the  skin for 7-10 days and will come off on their own.  If your surgeon used skin glue on the incision, you may shower in 24 hours.  The glue will flake off over the next 2-3 weeks.  Any sutures or staples will be removed at the office during your follow-up visit. °8. ACTIVITIES:  You may resume regular daily activities (gradually increasing) beginning the next day.  Wearing a good support bra or sports bra minimizes pain and swelling.  You may have sexual intercourse when it is comfortable. °a. You may drive when you no longer are taking prescription pain medication, you can comfortably wear a seatbelt, and you can safely maneuver your car and apply brakes. °b. RETURN TO WORK:  ______________________________________________________________________________________ °9. You should see your doctor in the office for a follow-up appointment approximately two weeks after your surgery.  Your doctor’s nurse will typically make your follow-up appointment when she calls you with your pathology report.  Expect your pathology report 3-4 business days after your surgery.  You may call to check if you do not hear from us after three days. °10. OTHER INSTRUCTIONS: _______________________________________________________________________________________________ _____________________________________________________________________________________________________________________________________ °_____________________________________________________________________________________________________________________________________ °_____________________________________________________________________________________________________________________________________ ° °WHEN TO CALL DR WAKEFIELD: °1. Fever over 101.0 °2. Nausea and/or vomiting. °3. Extreme swelling or bruising. °4. Continued bleeding from incision. °5. Increased pain, redness, or drainage from the incision. ° °The clinic staff is available to answer your questions during regular  business hours.  Please don’t hesitate to call and ask to speak to one of the nurses for clinical concerns.  If   you have a medical emergency, go to the nearest emergency room or call 911.  A surgeon from [facility] is always on call at the hospital. ° °For further questions, please visit [email or web address] ° ° ° ° ° °Post Anesthesia Home Care Instructions ° °Activity: °Get plenty of rest for the remainder of the day. A responsible individual must stay with you for 24 hours following the procedure.  °For the next 24 hours, DO NOT: °-Drive a car °-Operate machinery °-Drink alcoholic beverages °-Take any medication unless instructed by your physician °-Make any legal decisions or sign important papers. ° °Meals: °Start with liquid foods such as gelatin or soup. Progress to regular foods as tolerated. Avoid greasy, spicy, heavy foods. If nausea and/or vomiting occur, drink only clear liquids until the nausea and/or vomiting subsides. Call your physician if vomiting continues. ° °Special Instructions/Symptoms: °Your throat may feel dry or sore from the anesthesia or the breathing tube placed in your throat during surgery. If this causes discomfort, gargle with warm salt water. The discomfort should disappear within 24 hours. ° °If you had a scopolamine patch placed behind your ear for the management of post- operative nausea and/or vomiting: ° °1. The medication in the patch is effective for 72 hours, after which it should be removed.  Wrap patch in a tissue and discard in the trash. Wash hands thoroughly with soap and water. °2. You may remove the patch earlier than 72 hours if you experience unpleasant side effects which may include dry mouth, dizziness or visual disturbances. °3. Avoid touching the patch. Wash your hands with soap and water after contact with the patch. °  ° °

## 2017-07-24 NOTE — Interval H&P Note (Signed)
History and Physical Interval Note:  07/24/2017 11:35 AM  Jody Duke  has presented today for surgery, with the diagnosis of LEFT BREAST MASS  The various methods of treatment have been discussed with the patient and family. After consideration of risks, benefits and other options for treatment, the patient has consented to  Procedure(s): LEFT BREAST MASS EXCISIONAL BIOPSY (Left) as a surgical intervention .  The patient's history has been reviewed, patient examined, no change in status, stable for surgery.  I have reviewed the patient's chart and labs.  Questions were answered to the patient's satisfaction.     Rolm Bookbinder

## 2017-07-24 NOTE — Op Note (Signed)
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 Serita Grammes Anesthesia: Gen. Estimated blood loss: minimal Complications: None Drains: None Specimens:Left breast mass marked with paint Sponge and needle count correct at completion Disposition to recovery stable  Indications: This is a49 yof with a large left breast mass. 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 I saw her and we discussed excisional biopsy.   Procedure: After informed consent was obtained she was then taken to the operating room. She was given antibiotics.Sequential compression devices were on her legs. She was placed under general anesthesia without complication. Her chestwas then prepped and draped in the standard sterile surgical fashion. A surgical timeout was then performed.   The mass was in the upper outer left breast.  I identified this by ultrasound and by palpation. I infiltrated marcaine and made a periareolar incision to hide the scar. then tunneled to the lesion. I removed it in total. I did not take margins at this time. I placed a clip where the mass was. This was then all sent to pathology. Hemostasis was observed. I closed the breast tissue with a 2-0 Vicryl. The dermis was closed with 3-0 Vicryl and the skin with 5-0 Monocryl.Dermabond and steristrips were placed on the incision. A breast binder was placed. She was transferred to recovery stable

## 2017-07-24 NOTE — H&P (Signed)
57 yof referred by Dr Tamala Julian for left breast mass. she noted this in early December. no nipple dc. no prior breast history. fh in mgm. she underwent mm that shows c density breasts. there is a large lobular mass in uo left breast. there is also in upper inner left breast a low density lobular mass. in uoq right breast there is oval circumscribed mass. US shows a 3.3x2.5x3.3 cm mass left breast uoq. there is a 8 mm cluster of cysts in left breast and there is a 1.3 cm oval mass in right brest. she underwent biopsy of two masses. 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 she is here to discuss options.   Past Surgical History Alean Rinne, [place]; 07/15/2017 2:54 PM) No pertinent past surgical history   Diagnostic Studies History Alean Rinne, [place]; 07/15/2017 2:54 PM) Colonoscopy  never Mammogram  within last year  Allergies Alean Rinne, RMA; 07/15/2017 2:58 PM) Percocet *ANALGESICS - OPIOID*  Allergies Reconciled   Medication History Alean Rinne, RMA; 07/15/2017 2:58 PM) TraMADol HCl (50MG  Tablet, Oral) Active. IBU (400MG  Tablet, Oral) Active. Medications Reconciled  Social History Alean Rinne, [place]; 07/15/2017 2:54 PM) Alcohol use  Occasional alcohol use. Caffeine use  Carbonated beverages, Coffee. No drug use  Tobacco use  Current every day smoker.  Family History Alean Rinne, [place]; 07/15/2017 2:54 PM) Alcohol Abuse  Mother. Arthritis  Father. Depression  Mother. Diabetes Mellitus  Family Members In General, Father, Mother. Heart Disease  Father. Hypertension  Brother, Family Members In [place], Father, Mother. Kidney Disease  Mother. Malignant Neoplasm Of Pancreas  Family Members In General. Migraine Headache  Father. Thyroid problems  Family Members In General, Mother.  Pregnancy / Birth History Alean Rinne, [place]; 07/15/2017 2:54 PM) Age  at menarche  23 years. Gravida  1 Irregular periods  Maternal age  <15 Para  0  Other Problems Alean Rinne, [place]; 07/15/2017 2:54 PM) Bladder Problems  Lump In Breast    Review of Systems Alean Rinne RMA; 07/15/2017 2:54 PM) General Present- Night Sweats, Weight Gain and Weight Loss. Not Present- Appetite Loss, Chills, Fatigue and Fever. Skin Present- Change in Wart/Mole. Not Present- Dryness, Hives, Jaundice, New Lesions, Non-Healing Wounds, Rash and Ulcer. HEENT Present- Ringing in the Ears. Not Present- Earache, Hearing Loss, Hoarseness, Nose Bleed, Oral Ulcers, Seasonal Allergies, Sinus Pain, Sore Throat, Visual Disturbances, Wears glasses/contact lenses and Yellow Eyes. Respiratory Present- Snoring and Wheezing. Not Present- Bloody sputum, Chronic Cough and Difficulty Breathing. Breast Present- Breast Mass and Breast Pain. Not Present- Nipple Discharge and Skin Changes. Cardiovascular Present- Leg Cramps. Not Present- Chest Pain, Difficulty Breathing Lying Down, Palpitations, Rapid Heart Rate, Shortness of Breath and Swelling of Extremities. Gastrointestinal Not Present- Abdominal Pain, Bloating, Bloody Stool, Change in Bowel Habits, Chronic diarrhea, Constipation, Difficulty Swallowing, Excessive gas, Gets full quickly at meals, Hemorrhoids, Indigestion, Nausea, Rectal Pain and Vomiting. Female Genitourinary Present- Urgency. Not Present- Frequency, Nocturia, Painful Urination and Pelvic Pain. Musculoskeletal Not Present- Back Pain, Joint Pain, Joint Stiffness, Muscle Pain, Muscle Weakness and Swelling of Extremities. Neurological Not Present- Decreased Memory, Fainting, Headaches, Numbness, Seizures, Tingling, Tremor, Trouble walking and Weakness. Psychiatric Not Present- Anxiety, Bipolar, Change in Sleep Pattern, Depression, Fearful and Frequent crying. Endocrine Present- Hair Changes and Hot flashes. Not Present- Cold Intolerance, Excessive Hunger, Heat Intolerance and New  Diabetes.  Vitals Mardene Celeste King RMA; 07/15/2017 2:57 PM) 07/15/2017 2:55 PM Weight: 213.6 lb Height: 61in  Body Surface Area: 1.94 m Body Mass Index: 40.36 kg/m  Temp.: 98.36F  Pulse: 99 (Regular)  BP: 160/85 (Sitting, Left Arm, Standard) Physical Exam Rolm Bookbinder MD; 07/15/2017 4:18 PM) General Mental Status-Alert. Orientation-Oriented X3. Head and Neck Trachea-midline. Thyroid Gland Characteristics - normal size and consistency. Eye Sclera/Conjunctiva - Bilateral-No scleral icterus. Chest and Lung Exam Chest and lung exam reveals -quiet, even and easy respiratory effort with no use of accessory muscles and on auscultation, normal breath sounds, no adventitious sounds and normal vocal resonance. Breast Nipples-No Discharge. Left breast mass Cardiovascular Cardiovascular examination reveals -normal heart sounds, regular rate and rhythm with no murmurs. Lymphatic Head & Neck General Head & Neck Lymphatics: Bilateral - Description - Normal. Axillary General Axillary Region: Bilateral - Description - Normal. Note: no Steele adenopathy   Assessment & Plan Rolm Bookbinder MD; 07/15/2017 4:22 PM) LARGE MASS OF LEFT BREAST (N63.20) Story: Left breast mass excision right side is concordant and I think reasonable to follow the cysts can be followed also. the large mass concerning for phyllodes needs excision. discussed excisional biopsy with patient and will schedule asap. risks,recovery, possibility for further surgery discussed.

## 2017-07-25 ENCOUNTER — Encounter (HOSPITAL_BASED_OUTPATIENT_CLINIC_OR_DEPARTMENT_OTHER): Payer: Self-pay | Admitting: General Surgery

## 2017-07-28 ENCOUNTER — Ambulatory Visit (INDEPENDENT_AMBULATORY_CARE_PROVIDER_SITE_OTHER): Payer: 59 | Admitting: Family Medicine

## 2017-07-28 ENCOUNTER — Other Ambulatory Visit: Payer: Self-pay

## 2017-07-28 ENCOUNTER — Encounter: Payer: Self-pay | Admitting: Family Medicine

## 2017-07-28 VITALS — BP 132/82 | HR 96 | Temp 98.0°F | Resp 16 | Ht 65.95 in | Wt 210.0 lb

## 2017-07-28 DIAGNOSIS — Z6833 Body mass index (BMI) 33.0-33.9, adult: Secondary | ICD-10-CM | POA: Diagnosis not present

## 2017-07-28 DIAGNOSIS — R030 Elevated blood-pressure reading, without diagnosis of hypertension: Secondary | ICD-10-CM

## 2017-07-28 DIAGNOSIS — N6320 Unspecified lump in the left breast, unspecified quadrant: Secondary | ICD-10-CM

## 2017-07-28 DIAGNOSIS — Z23 Encounter for immunization: Secondary | ICD-10-CM | POA: Diagnosis not present

## 2017-07-28 DIAGNOSIS — E7800 Pure hypercholesterolemia, unspecified: Secondary | ICD-10-CM | POA: Diagnosis not present

## 2017-07-28 DIAGNOSIS — N6310 Unspecified lump in the right breast, unspecified quadrant: Secondary | ICD-10-CM | POA: Diagnosis not present

## 2017-07-28 DIAGNOSIS — E6609 Other obesity due to excess calories: Secondary | ICD-10-CM

## 2017-07-28 DIAGNOSIS — E119 Type 2 diabetes mellitus without complications: Secondary | ICD-10-CM | POA: Diagnosis not present

## 2017-07-28 DIAGNOSIS — R2242 Localized swelling, mass and lump, left lower limb: Secondary | ICD-10-CM | POA: Diagnosis not present

## 2017-07-28 DIAGNOSIS — R3129 Other microscopic hematuria: Secondary | ICD-10-CM

## 2017-07-28 DIAGNOSIS — Z720 Tobacco use: Secondary | ICD-10-CM | POA: Diagnosis not present

## 2017-07-28 DIAGNOSIS — N921 Excessive and frequent menstruation with irregular cycle: Secondary | ICD-10-CM | POA: Diagnosis not present

## 2017-07-28 LAB — POCT URINALYSIS DIP (MANUAL ENTRY)
Bilirubin, UA: NEGATIVE
Glucose, UA: NEGATIVE mg/dL
Ketones, POC UA: NEGATIVE mg/dL
Leukocytes, UA: NEGATIVE
Nitrite, UA: NEGATIVE
Protein Ur, POC: 100 mg/dL — AB
Spec Grav, UA: >=1.03 — AB (ref 1.010–1.025)
Urobilinogen, UA: 0.2 E.U./dL
pH, UA: 5.5 (ref 5.0–8.0)

## 2017-07-28 MED ORDER — LANCETS MISC: 11 refills | Status: DC

## 2017-07-28 MED ORDER — METFORMIN HCL 500 MG PO TABS: 500.0000 mg | ORAL_TABLET | Freq: Every day | ORAL | 1 refills | Status: DC

## 2017-07-28 MED ORDER — ATORVASTATIN CALCIUM 10 MG PO TABS: 10.0000 mg | ORAL_TABLET | Freq: Every day | ORAL | 1 refills | Status: DC

## 2017-07-28 MED ORDER — GLUCOSE BLOOD VI STRP: ORAL_STRIP | 12 refills | Status: DC

## 2017-07-28 MED ORDER — FREESTYLE SYSTEM KIT: 1.0000 | PACK | 0 refills | Status: DC | PRN

## 2017-07-28 NOTE — Progress Notes (Signed)
Subjective:    Patient ID: Jody Duke, female    DOB: 12/14/1970, 47 y.o.   MRN: [id number]  07/28/2017  Hospitalization Follow-up (pt had surgery on her left breast on 1/24 and labs from last visit. )    HPI This 47 y.o. female presents for six week follow-up of irregular vaginal bleeding, uterine enlargement, LEFT breast mass, LEFT ankle swelling, elevated blood pressure, new onset DMII.  Management changes made at last visit included: -pap smear obtained; having irregular menses; refer to gynecology for endometrial bx; uterine enlargement appreciated on exam most consistent with fibroids.  -New onset left breast mass.  Refer for diagnostic mammogram and breast ultrasound. -New onset left ankle swelling versus mass in the posterior joint space.  Obtain x-ray of left ankle.  Refer to orthopedics for further management. -New onset elevated blood pressure without previous diagnosis.  Recommend weight loss, exercise, low-sodium diet.  Return to clinic in 6 weeks for repeat blood pressure. -s/p TDAP. 1. Pap smear is normal. No evidence of human papilloma virus/HPV. No evidence of gonorrhea or chlamydia. No evidence of HIV. 2, No evidence of anemia with a hemoglobin of 12.9. 3. Sugar/glucose is elevated at 108. Hemoglobin A1c is elevated at 6.7. This is consistent with a diabetic state. We will discuss in detail at her follow-up visit.  I recommend weight loss, exercise, and low-carbohydrate low-sugar food choices. You should AVOID: regular sodas, sweetened tea, fruit juices. You should LIMIT: breads, pastas, rice, potatoes, and desserts/sweets. I would recommend limiting your total carbohydrate intake per meal to 45 grams; I would limit your total carbohydrate intake per snack to 30 grams. I would also have a goal of 60 grams of protein intake per day; this would equal 10-15 grams of protein per meal and 5-10 grams of protein per snack. 4. Liver and kidney functions are normal. 5.  Cholesterol level is extremely elevated at 283. A normal cholesterol level is less than 200.  I recommend weight loss, exercise, and low-cholesterol low-fat food choices. I recommend limiting red meat to once per week; I recommend limiting fried foods to once per month. 6. Thyroid function is normal. 7. Urine with a large amount of blood present. Recommend repeating at her next visit.  IMPRESSION OF MAMMOGRAM DIAGNOSTIC 07/02/2017 1. Indeterminate palpable left breast mass 1 o'clock position. 2. Indeterminate right breast mass 10 o'clock position. 3. Probable cluster of cysts left breast 1030 o'clock 7 cm from the nipple. RECOMMENDATION: 1. Ultrasound-guided core needle biopsy palpable left breast mass 1 o'clock position. 2. Ultrasound-guided core needle biopsy right breast mass 10 o'clock position. 3. If these demonstrate benign pathology, recommend six-month follow-up mammogram and ultrasound to ensure stability of probably benign left breast mass 1030 o'clock. If either of the prior biopsies are malignant, recommend ultrasound-guided core needle biopsy or stereotactic guided biopsy of the left breast mass 1030 o'clock. I have discussed the findings and recommendations with the patient. Results were also provided in writing at the conclusion of the visit. If applicable, a reminder letter will be sent to the patient regarding the next appointment. BI-RADS CATEGORY  4: Suspicious. SURGICAL PATHOLOGY OF BREAST BIOPSIES ON 07/04/17: 1. Breast, left, needle core biopsy, 1:00 o'clock - FIBROEPITHELIAL LESION - SEE COMMENT 2. Breast, right, needle core biopsy, 10:00 o'clock - FIBROEPITHELIAL LESION - SEE COMMENT Microscopic Comment 1. 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 These results were called to [facility] on July 07, 2017.  2. The features present in the biopsy are most consistent with fibroadenoma; clinical  correlation is advised. IMPRESSION OF LEFT ANKLE XRAY ON 06/16/17: Large 4.3 cm calcified mass posterior to the ankle joint. Differential considerations include sarcoma, tumoral calcinosis, and deposition disease such is gout although joint space is relatively preserved. Further evaluation MRI with and without contrast is recommended ; this may be performed on a nonemergent basis.  IMPRESSION OF MRI ANKLE LEFT ON 06/25/17: Calcified mass lesion in [place]'s fat does not have aggressive features. The lesion could be secondary to tumoral calcinosis, remote infectious or inflammatory process or less likely myositis ossificans.    L ankle pain/swelling: referred to [facility].  Unknown etiology. Prescribed Tramadol for ankle since last visit.    L breast pain: having horrible breast pain s/p surgical biopsy on 07/24/17 by Donne Hazel; feels like ripping it when turning.  Having pain.  S/p L breast biopsy on 07/04/17.  Dr. Donne Hazel surgery on 07/24/17.   Left message with Dr. Donne Hazel regarding persistent pain; Tylenol and Ibuprofen recommended for pain.  Pathology still pending.    DMII: new onset.  Father with diabetes.  Loves pasta.  Rare tea.    Hypercholesterolemia: agreeable to medication at this time.  Irregular menses: scheduled with gynecology 08/15/17.    Elevated blood pressure: improved today.   Microscopic hematuria: present at last visit.   BP Readings from Last 3 Encounters:  07/28/17 132/82  07/24/17 125/82  06/16/17 140/82   Wt Readings from Last 3 Encounters:  07/28/17 210 lb (95.3 kg)  07/24/17 211 lb 6.4 oz (95.9 kg)  06/16/17 207 lb (93.9 kg)   Immunization History  Administered Date(s) Administered  . Influenza-Unspecified 03/31/2017  . Pneumococcal Polysaccharide-23 07/28/2017  . Tdap 06/16/2017    Review of Systems  Constitutional: Negative for chills, diaphoresis, fatigue and fever.  Eyes: Negative for visual disturbance.  Respiratory:  Negative for cough and shortness of breath.   Cardiovascular: Negative for chest pain, palpitations and leg swelling.  Gastrointestinal: Negative for abdominal pain, constipation, diarrhea, nausea and vomiting.  Endocrine: Negative for cold intolerance, heat intolerance, polydipsia, polyphagia and polyuria.  Genitourinary: Positive for menstrual problem.  Musculoskeletal: Positive for arthralgias, gait problem and joint swelling.  Neurological: Negative for dizziness, tremors, seizures, syncope, facial asymmetry, speech difficulty, weakness, light-headedness, numbness and headaches.    Past Medical History:  Diagnosis Date  . Breast mass, left 07/2017  . Migraines    Past Surgical History:  Procedure Laterality Date  . BREAST BIOPSY Left 07/24/2017   Procedure: LEFT BREAST MASS EXCISIONAL BIOPSY;  Surgeon: Rolm Bookbinder, MD;  Location: [facility];  Service: General;  Laterality: Left;  . FOOT GANGLION EXCISION Left    age 44   Allergies  Allergen Reactions  . Percocet [Oxycodone-Acetaminophen] Nausea And Vomiting   Current Outpatient Medications on File Prior to Visit  Medication Sig Dispense Refill  . ibuprofen (ADVIL,MOTRIN) 800 MG tablet Take 800 mg by mouth every 8 (eight) hours as needed for fever, headache, mild pain, moderate pain or cramping.    . meloxicam (MOBIC) 15 MG tablet Take 15 mg by mouth daily.    . traMADol (ULTRAM) 50 MG tablet Take 1 tablet (50 mg total) by mouth every 8 (eight) hours as needed. 30 tablet 0   No current facility-administered medications on file prior to visit.    Social History   Socioeconomic History  . Marital status: Single    Spouse name: Not on file  .  Number of children: Not on file  . Years of education: Not on file  . Highest education level: Not on file  Social Needs  . Financial resource strain: Not on file  . Food insecurity - worry: Not on file  . Food insecurity - inability: Not on file  .  Transportation needs - medical: Not on file  . Transportation needs - non-medical: Not on file  Occupational History  . Not on file  Tobacco Use  . Smoking status: Current Every Day Smoker    Packs/day: 1.00    Years: 30.00    Pack years: 30.00    Types: Cigarettes  . Smokeless tobacco: Never Used  Substance and Sexual Activity  . Alcohol use: No    Frequency: Never  . Drug use: No  . Sexual activity: Yes  Other Topics Concern  . Not on file  Social History Narrative   Marital status: single; girlfriend x 3 years; from [place]; moved [place] in 2015      Children: none      Lives: with girlfriend      Employment: works at [employer]; runs seasonal department      Tobacco: 1.5ppd x age 25.  Never quit.      Alcohol: None; rare      Drugs: week as teenager      Exercise: none      Sexual activity: females since 59; males prior.  One STD/Gonorrhea.   Family History  Problem Relation Age of Onset  . Hyperlipidemia Mother   . Alcohol abuse Mother   . Cirrhosis Mother   . Diabetes Father   . Hyperlipidemia Father   . Hypertension Father   . Stroke Father   . Heart disease Father 23       CAD with stenting multiple  . Hyperlipidemia Brother   . Hypertension Brother        Objective:    BP 132/82   Pulse 96   Temp 98 F (36.7 C) (Oral)   Resp 16   Ht 5' 5.95" (1.675 m)   Wt 210 lb (95.3 kg)   LMP 07/09/2017 (Exact Date) Comment: denies chance of pregnancy  SpO2 96%   BMI 33.95 kg/m  Physical Exam  Constitutional: She is oriented to person, place, and time. She appears well-developed and well-nourished. No distress.  HENT:  Head: Normocephalic and atraumatic.  Right Ear: External ear normal.  Left Ear: External ear normal.  Nose: Nose normal.  Mouth/Throat: Oropharynx is clear and moist.  Eyes: Conjunctivae and EOM are normal. Pupils are equal, round, and reactive to light.  Neck: Normal range of motion. Neck supple. Carotid bruit is not present. No thyromegaly  present.  Cardiovascular: Normal rate, regular rhythm, normal heart sounds and intact distal pulses. Exam reveals no gallop and no friction rub.  No murmur heard. Pulmonary/Chest: Effort normal and breath sounds normal. She has no wheezes. She has no rales.  Lymphadenopathy:    She has no cervical adenopathy.  Neurological: She is alert and oriented to person, place, and time. No cranial nerve deficit.  Skin: Skin is warm and dry. No rash noted. She is not diaphoretic. No erythema. No pallor.  Psychiatric: She has a normal mood and affect. Her behavior is normal.   No results found. Depression screen PHQ 2/9 06/16/2017  Decreased Interest 0  Down, Depressed, Hopeless 0  PHQ - 2 Score 0   Fall Risk  06/16/2017  Falls in the past year? No  Assessment & Plan:   1. Type 2 diabetes mellitus without complication, without long-term current use of insulin ([place])   2. Left breast mass   3. Pure hypercholesterolemia   4. Menometrorrhagia   5. Ankle mass, left   6. Blood pressure elevated without history of HTN   7. Breast mass, right   8. Hematuria, microscopic   9. Tobacco abuse   10. Need for prophylactic vaccination against Streptococcus pneumoniae (pneumococcus)   11. Class 1 obesity due to excess calories with serious comorbidity and body mass index (BMI) of 33.0 to 33.9 in adult     New onset diabetes mellitus type 2.  Educated on diagnosis and medical management.  Refer for diabetic education.  Prescription for glucometer with test strips and lancets provided.  Initiate metformin 500 mg once daily with largest meal of the day.   I recommend weight loss, exercise, and low-carbohydrate low-sugar food choices. You should AVOID: regular sodas, sweetened tea, fruit juices.  You should LIMIT: breads, pastas, rice, potatoes, and desserts/sweets.  I would recommend limiting your total carbohydrate intake per meal to 45 grams; I would limit your total carbohydrate intake per snack to 30  grams.  I would also have a goal of 60 grams of protein intake per day; this would equal 10-15 grams of protein per meal and 5-10 grams of protein per snack. Status post Pneumovax.  Initiate atorvastatin 10 mg daily for hypercholesterolemia in a diabetic patient.  Left breast mass: Status post diagnostic mammogram with left breast ultrasound.  Status post breast biopsy consistent with phyllodes tumor.  Status post surgical biopsy excisional 3 days ago.  Pathology pending.  Agreeable to hydrocodone for pain control at bedtime.   Left ankle mass: Status post x-rays of left ankle and CT of left ankle.  Refer to local orthopedist who has referred patient to [facility] for surgical consultation.   Menometrorrhagia: Persistent.  Pap smear is normal.  Patient has upcoming appointment with gynecology for endometrial biopsy and pelvic ultrasound.  Blood pressure elevation: Improvement today yet borderline.  Recommend weight loss, exercise, low-sodium diet.  Microscopic hematuria: In a smoker.  Repeat today.  If persistent, will warrant urology consultation.  Right breast mass:  Status post diagnostic mammogram and ultrasound.  Status post biopsy.  Pathology consistent with a fibroepithelial lesion.  Consistent with fibroadenoma.  Repeat diagnostic mammogram in 6 months recommended.  Tobacco abuse: Highly encourage smoking cessation with patient considering upcoming left ankle surgery and new diagnosis of diabetes mellitus type 2.  Patient pre-contemplative at this time.  Obesity with BMI of 33:  -recommend weight loss, exercise for 30-60 minutes five days per week; recommend 1200 kcal restriction per day with a minimum of 60 grams of protein per day.  Refer to nutritionist.   -prolonged face-to-face for 40 minutes with greater than 50% of time dedicated to counseling and coordination of care.  Orders Placed This Encounter  Procedures  . Pneumococcal polysaccharide vaccine 23-valent greater than  or equal to 2yo subcutaneous/IM  . Microalbumin / creatinine urine ratio  . Ambulatory referral to diabetic education    Referral Priority:   Routine    Referral Type:   Consultation    Referral Reason:   Specialty Services Required    Number of Visits Requested:   1  . POCT urinalysis dipstick   Meds ordered this encounter  Medications  . atorvastatin (LIPITOR) 10 MG tablet    Sig: Take 1 tablet (10 mg total) by mouth daily.  Dispense:  90 tablet    Refill:  1  . metFORMIN (GLUCOPHAGE) 500 MG tablet    Sig: Take 1 tablet (500 mg total) by mouth daily.    Dispense:  90 tablet    Refill:  1  . Lancets MISC    Sig: Check sugar once daily dx DMII controlled, non-insulin, without complications    Dispense:  100 each    Refill:  11  . glucose monitoring kit (FREESTYLE) monitoring kit    Sig: 1 each by Does not apply route as needed for other. Dx: DMII non-insulin dependent without complications; controlled;    Dispense:  1 each    Refill:  0  . glucose blood test strip    Sig: Check sugar once daily  Dx: DMII controlled, non-insulin, without complications    Dispense:  100 each    Refill:  12  . HYDROcodone-acetaminophen (NORCO) 5-325 MG tablet    Sig: Take 1 tablet by mouth every 6 (six) hours as needed.    Dispense:  20 tablet    Refill:  0    Return in about 3 months (around 10/26/2017) for recheck.   Caulin Begley Elayne Guerin, M.D. [facility] at [facility] [address] [place], [place]  [address] [phone] phone [phone] fax

## 2017-07-28 NOTE — Patient Instructions (Addendum)
   IF you received an x-ray today, you will receive an invoice from [facility]. Please contact [facility] at [phone] with questions or concerns regarding your invoice.   IF you received labwork today, you will receive an invoice from [facility]. Please contact [facility] at [phone] with questions or concerns regarding your invoice.   Our billing staff will not be able to assist you with questions regarding bills from these companies.  You will be contacted with the lab results as soon as they are available. The fastest way to get your results is to activate your My Chart account. Instructions are located on the last page of this paperwork. If you have not heard from us regarding the results in 2 weeks, please contact this office.      Diabetes Mellitus and Standards of Medical Care Managing diabetes (diabetes mellitus) can be complicated. Your diabetes treatment may be managed by a team of health care providers, including:  A diet and nutrition specialist (registered dietitian).  A nurse.  A certified diabetes educator (CDE).  A diabetes specialist (endocrinologist).  An eye doctor.  A primary care provider.  A dentist.  Your health care providers follow a schedule in order to help you get the best quality of care. The following schedule is a general guideline for your diabetes management plan. Your health care providers may also give you more specific instructions. HbA1c ( hemoglobin A1c) test This test provides information about blood sugar (glucose) control over the previous 2-3 months. It is used to check whether your diabetes management plan needs to be adjusted.  If you are meeting your treatment goals, this test is done at least 2 times a year.  If you are not meeting treatment goals or if your treatment goals have changed, this test is done 4 times a year.  Blood pressure test  This test is done at every routine medical visit. For most  people, the goal is less than 130/80. Ask your health care provider what your goal blood pressure should be. Dental and eye exams  Visit your dentist two times a year.  If you have type 1 diabetes, get an eye exam 3-5 years after you are diagnosed, and then once a year after your first exam. ? If you were diagnosed with type 1 diabetes as a child, get an eye exam when you are age 10 or older and have had diabetes for 3-5 years. After the first exam, you should get an eye exam once a year.  If you have type 2 diabetes, have an eye exam as soon as you are diagnosed, and then once a year after your first exam. Foot care exam  Visual foot exams are done at every routine medical visit. The exams check for cuts, bruises, redness, blisters, sores, or other problems with the feet.  A complete foot exam is done by your health care provider once a year. This exam includes an inspection of the structure and skin of your feet, and a check of the pulses and sensation in your feet. ? Type 1 diabetes: Get your first exam 3-5 years after diagnosis. ? Type 2 diabetes: Get your first exam as soon as you are diagnosed.  Check your feet every day for cuts, bruises, redness, blisters, or sores. If you have any of these or other problems that are not healing, contact your health care provider. Kidney function test ( urine microalbumin)  This test is done once a year. ? Type 1 diabetes:   Get your first test 5 years after diagnosis. ? Type 2 diabetes: Get your first test as soon as you are diagnosed.  If you have chronic kidney disease (CKD), get a serum creatinine and estimated glomerular filtration rate (eGFR) test once a year. Lipid profile (cholesterol, HDL, LDL, triglycerides)  This test should be done when you are diagnosed with diabetes, and every 5 years after the first test. If you are on medicines to lower your cholesterol, you may need to get this test done every year. ? The goal for LDL is less than  100 mg/dL (5.5 mmol/L). If you are at high risk, the goal is less than 70 mg/dL (3.9 mmol/L). ? The goal for HDL is 40 mg/dL (2.2 mmol/L) for men and 50 mg/dL(2.8 mmol/L) for women. An HDL cholesterol of 60 mg/dL (3.3 mmol/L) or higher gives some protection against heart disease. ? The goal for triglycerides is less than 150 mg/dL (8.3 mmol/L). Immunizations  The yearly flu (influenza) vaccine is recommended for everyone 6 months or older who has diabetes.  The pneumonia (pneumococcal) vaccine is recommended for everyone 2 years or older who has diabetes. If you are 65 or older, you may get the pneumonia vaccine as a series of two separate shots.  The hepatitis B vaccine is recommended for adults shortly after they have been diagnosed with diabetes.  The Tdap (tetanus, diphtheria, and pertussis) vaccine should be given: ? According to normal childhood vaccination schedules, for children. ? Every 10 years, for adults who have diabetes.  The shingles vaccine is recommended for people who have had chicken pox and are 50 years or older. Mental and emotional health  Screening for symptoms of eating disorders, anxiety, and depression is recommended at the time of diagnosis and afterward as needed. If your screening shows that you have symptoms (you have a positive screening result), you may need further evaluation and be referred to a mental health care provider. Diabetes self-management education  Education about how to manage your diabetes is recommended at diagnosis and ongoing as needed. Treatment plan  Your treatment plan will be reviewed at every medical visit. Summary  Managing diabetes (diabetes mellitus) can be complicated. Your diabetes treatment may be managed by a team of health care providers.  Your health care providers follow a schedule in order to help you get the best quality of care.  Standards of care including having regular physical exams, blood tests, blood pressure  monitoring, immunizations, screening tests, and education about how to manage your diabetes.  Your health care providers may also give you more specific instructions based on your individual health. This information is not intended to replace advice given to you by your health care provider. Make sure you discuss any questions you have with your health care provider. Document Released: 04/14/2009 Document Revised: 03/15/2016 Document Reviewed: 03/15/2016 Elsevier Interactive Patient Education  2018 [employer]vier Inc.  

## 2017-07-29 LAB — MICROALBUMIN / CREATININE URINE RATIO
Creatinine, Urine: 105 mg/dL
Microalb/Creat Ratio: 641.1 mg/g creat — ABNORMAL HIGH (ref 0.0–30.0)
Microalbumin, Urine: 673.2 ug/mL

## 2017-07-30 ENCOUNTER — Encounter: Payer: Self-pay | Admitting: Family Medicine

## 2017-07-30 DIAGNOSIS — N921 Excessive and frequent menstruation with irregular cycle: Secondary | ICD-10-CM | POA: Insufficient documentation

## 2017-07-30 DIAGNOSIS — E1129 Type 2 diabetes mellitus with other diabetic kidney complication: Secondary | ICD-10-CM | POA: Insufficient documentation

## 2017-07-30 DIAGNOSIS — E7800 Pure hypercholesterolemia, unspecified: Secondary | ICD-10-CM | POA: Insufficient documentation

## 2017-07-30 DIAGNOSIS — N6310 Unspecified lump in the right breast, unspecified quadrant: Secondary | ICD-10-CM | POA: Insufficient documentation

## 2017-07-30 DIAGNOSIS — R3129 Other microscopic hematuria: Secondary | ICD-10-CM

## 2017-07-30 DIAGNOSIS — E6609 Other obesity due to excess calories: Secondary | ICD-10-CM | POA: Insufficient documentation

## 2017-07-30 DIAGNOSIS — Z6833 Body mass index (BMI) 33.0-33.9, adult: Secondary | ICD-10-CM

## 2017-07-30 DIAGNOSIS — Z720 Tobacco use: Secondary | ICD-10-CM | POA: Insufficient documentation

## 2017-07-30 DIAGNOSIS — R2242 Localized swelling, mass and lump, left lower limb: Secondary | ICD-10-CM | POA: Insufficient documentation

## 2017-07-30 DIAGNOSIS — R809 Proteinuria, unspecified: Secondary | ICD-10-CM

## 2017-07-30 DIAGNOSIS — R030 Elevated blood-pressure reading, without diagnosis of hypertension: Secondary | ICD-10-CM | POA: Insufficient documentation

## 2017-07-30 DIAGNOSIS — E119 Type 2 diabetes mellitus without complications: Secondary | ICD-10-CM | POA: Insufficient documentation

## 2017-07-30 HISTORY — DX: Tobacco use: Z72.0

## 2017-07-30 HISTORY — DX: Pure hypercholesterolemia, unspecified: E78.00

## 2017-07-30 HISTORY — DX: Type 2 diabetes mellitus with other diabetic kidney complication: E11.29

## 2017-07-30 HISTORY — DX: Localized swelling, mass and lump, left lower limb: R22.42

## 2017-07-30 HISTORY — DX: Other microscopic hematuria: R31.29

## 2017-07-30 HISTORY — DX: Unspecified lump in the right breast, unspecified quadrant: N63.10

## 2017-07-30 HISTORY — DX: Excessive and frequent menstruation with irregular cycle: N92.1

## 2017-07-30 HISTORY — DX: Proteinuria, unspecified: R80.9

## 2017-07-30 HISTORY — DX: Other obesity due to excess calories: E66.09

## 2017-07-30 HISTORY — DX: Elevated blood-pressure reading, without diagnosis of hypertension: R03.0

## 2017-07-30 MED ORDER — HYDROCODONE-ACETAMINOPHEN 5-325 MG PO TABS: 1.0000 | ORAL_TABLET | Freq: Four times a day (QID) | ORAL | 0 refills | Status: DC | PRN

## 2017-07-31 DIAGNOSIS — M79672 Pain in left foot: Secondary | ICD-10-CM | POA: Diagnosis not present

## 2017-07-31 DIAGNOSIS — M25572 Pain in left ankle and joints of left foot: Secondary | ICD-10-CM | POA: Diagnosis not present

## 2017-07-31 DIAGNOSIS — R2242 Localized swelling, mass and lump, left lower limb: Secondary | ICD-10-CM | POA: Diagnosis not present

## 2017-08-08 ENCOUNTER — Other Ambulatory Visit: Payer: Self-pay | Admitting: General Surgery

## 2017-08-11 ENCOUNTER — Encounter: Payer: 59 | Attending: Family Medicine | Admitting: Skilled Nursing Facility1

## 2017-08-11 ENCOUNTER — Encounter: Payer: Self-pay | Admitting: Skilled Nursing Facility1

## 2017-08-11 DIAGNOSIS — E119 Type 2 diabetes mellitus without complications: Secondary | ICD-10-CM | POA: Insufficient documentation

## 2017-08-11 DIAGNOSIS — Z713 Dietary counseling and surveillance: Secondary | ICD-10-CM | POA: Insufficient documentation

## 2017-08-11 NOTE — Patient Instructions (Addendum)
-  Always bring your meter with you everywhere you go -Always Properly dispose of your needles:  -Discard in a hard plastic/metal container with a lid (something the needle can't puncture)  -Write Do Not Recycle on the outside of the container  -Example: A laundry detergent bottle -Never use the same needle more than once -Eat 2-3 carbohydrate choices for each meal and 1 for each snack -A meal: carbohydrates, protein, vegetable -A snack: A Fruit OR Vegetable AND Protein  -Try to be more active -Always pay attention to your body keeping watchful of possible low blood sugar (below 70) or high blood sugar (above 200)  -Check your feet every day looking for anything that was not there the day before  -Prick the side of your finger  -Eczema Cortisone 10 lotion   Counseling: https://[facility].org/  -Try sugar free flavorings or sugar free natures twist   To stop Smoking: FreakyMates.[email or web address]  -Find a foot doctor

## 2017-08-11 NOTE — Progress Notes (Signed)
Pt is newly diagnosed with diabetes.  Pt has had diarrhea every night since taking the metformin.  Pt states she checks her blood sugar before bed: 150-185. Pt states in the middle of the night wakes up in a sweat having happened for many years. Pt states she has really bad dry skin on the bottom of her feet.  Pt states she has cut back on soda and juice and trying not to fry her foods.  Pt states he has a mass on her ankle and has had blood in her urine.  Pt states she works at [employer].  Pts A1C 6.7  To discuss next time: when to check blood sugar. Diabetes Self-Management Education  Visit Type: First/Initial  08/11/2017  Jody Duke, identified by name and date of birth, is a 47 y.o. female with a diagnosis of Diabetes: Type 2.   ASSESSMENT  There were no vitals taken for this visit. There is no height or weight on file to calculate BMI.  Diabetes Self-Management Education - 08/11/17 1500      Visit Information   Visit Type  First/Initial      Initial Visit   Diabetes Type  Type 2    Are you currently following a meal plan?  No    Are you taking your medications as prescribed?  Yes    Date Diagnosed  07/28/2017      Psychosocial Assessment   Patient Belief/Attitude about Diabetes  Afraid    Other persons present  Family Member    Patient Concerns  Nutrition/Meal planning      Pre-Education Assessment   Patient understands the diabetes disease and treatment process.  Needs Instruction    Patient understands incorporating nutritional management into lifestyle.  Needs Instruction    Patient undertands incorporating physical activity into lifestyle.  Needs Instruction    Patient understands using medications safely.  Needs Instruction    Patient understands monitoring blood glucose, interpreting and using results  Needs Instruction    Patient understands prevention, detection, and treatment of acute complications.  Needs Instruction    Patient understands prevention,  detection, and treatment of chronic complications.  Needs Instruction    Patient understands how to develop strategies to address psychosocial issues.  Needs Instruction    Patient understands how to develop strategies to promote health/change behavior.  Needs Instruction      Complications   How often do you check your blood sugar?  1-2 times/day    Postprandial Blood glucose range (mg/dL)  180-200;130-179    Have you had a dilated eye exam in the past 12 months?  No    Have you had a dental exam in the past 12 months?  No    Are you checking your feet?  Yes    How many days per week are you checking your feet?  7      Dietary Intake   Breakfast  coffee and sausage egg and cheese biscuit     Snack (morning)  chips or candy    Lunch  chips or candy    Snack (afternoon)  chips or candy    Dinner  chicken or fried fish or steak with french fries    Snack (evening)  cereal    Beverage(s)  soda, juice, coffee with hazelnut coffee creamer,       Exercise   Exercise Type  ADL's      Patient Education   Previous Diabetes Education  No    Disease state  Definition of diabetes, type 1 and 2, and the diagnosis of diabetes;Factors that contribute to the development of diabetes    Nutrition management   Role of diet in the treatment of diabetes and the relationship between the three main macronutrients and blood glucose level;Food label reading, portion sizes and measuring food.;Reviewed blood glucose goals for pre and post meals and how to evaluate the patients' food intake on their blood glucose level.;Carbohydrate counting;Information on hints to eating out and maintain blood glucose control.    Physical activity and exercise   Identified with patient nutritional and/or medication changes necessary with exercise.;Role of exercise on diabetes management, blood pressure control and cardiac health.    Monitoring  Purpose and frequency of SMBG.;Yearly dilated eye exam;Daily foot  exams;Taught/evaluated SMBG meter.    Acute complications  Taught treatment of hypoglycemia - the 15 rule.;Discussed and identified patients' treatment of hyperglycemia.    Chronic complications  Relationship between chronic complications and blood glucose control;Lipid levels, blood glucose control and heart disease;Dental care;Reviewed with patient heart disease, higher risk of, and prevention;Retinopathy and reason for yearly dilated eye exams;Identified and discussed with patient  current chronic complications;Assessed and discussed foot care and prevention of foot problems    Personal strategies to promote health  Review risk of smoking and offered smoking cessation;Lifestyle issues that need to be addressed for better diabetes care      Individualized Goals (developed by patient)   Nutrition  General guidelines for healthy choices and portions discussed    Physical Activity  Exercise 3-5 times per week;15 minutes per day      Post-Education Assessment   Patient understands the diabetes disease and treatment process.  Demonstrates understanding / competency    Patient understands incorporating nutritional management into lifestyle.  Demonstrates understanding / competency    Patient undertands incorporating physical activity into lifestyle.  Demonstrates understanding / competency    Patient understands using medications safely.  Demonstrates understanding / competency    Patient understands monitoring blood glucose, interpreting and using results  Demonstrates understanding / competency    Patient understands prevention, detection, and treatment of acute complications.  Demonstrates understanding / competency    Patient understands prevention, detection, and treatment of chronic complications.  Demonstrates understanding / competency    Patient understands how to develop strategies to address psychosocial issues.  Demonstrates understanding / competency    Patient understands how to develop  strategies to promote health/change behavior.  Demonstrates understanding / competency      Outcomes   Expected Outcomes  Demonstrated interest in learning. Expect positive outcomes    Future DMSE  4-6 wks    Program Status  Completed       Individualized Plan for Diabetes Self-Management Training:   Learning Objective:  Patient will have a greater understanding of diabetes self-management. Patient education plan is to attend individual and/or group sessions per assessed needs and concerns.   Plan:   Patient Instructions  -Always bring your meter with you everywhere you go -Always Properly dispose of your needles:  -Discard in a hard plastic/metal container with a lid (something the needle can't puncture)  -Write Do Not Recycle on the outside of the container  -Example: A laundry detergent bottle -Never use the same needle more than once -Eat 2-3 carbohydrate choices for each meal and 1 for each snack -A meal: carbohydrates, protein, vegetable -A snack: A Fruit OR Vegetable AND Protein  -Try to be more active -Always pay attention to your body keeping watchful  of possible low blood sugar (below 70) or high blood sugar (above 200)  -Check your feet every day looking for anything that was not there the day before  -Prick the side of your finger  -Eczema Cortisone 10 lotion   Counseling: https://[facility].org/  -Try sugar free flavorings or sugar free natures twist   To stop Smoking: FreakyMates.[email or web address]  -Find a foot doctor    Expected Outcomes:  Demonstrated interest in learning. Expect positive outcomes  Education material provided: Living Well with Diabetes, Meal plan card, My Plate and Snack sheet  If problems or questions, patient to contact team via:  Phone  Future DSME appointment: 4-6 wks

## 2017-08-13 ENCOUNTER — Encounter: Payer: Self-pay | Admitting: Gynecology

## 2017-08-13 ENCOUNTER — Ambulatory Visit (INDEPENDENT_AMBULATORY_CARE_PROVIDER_SITE_OTHER): Payer: 59 | Admitting: Gynecology

## 2017-08-13 VITALS — BP 124/80 | Ht 62.0 in | Wt 212.0 lb

## 2017-08-13 DIAGNOSIS — R1900 Intra-abdominal and pelvic swelling, mass and lump, unspecified site: Secondary | ICD-10-CM | POA: Diagnosis not present

## 2017-08-13 DIAGNOSIS — N924 Excessive bleeding in the premenopausal period: Secondary | ICD-10-CM

## 2017-08-13 DIAGNOSIS — L680 Hirsutism: Secondary | ICD-10-CM | POA: Diagnosis not present

## 2017-08-13 NOTE — Progress Notes (Signed)
    Jody Duke 1970-11-01 [id number]        47 y.o.  G0P0000 new patient presents complaining of heavy menses.  Notes that they have been heavy for years and seem to slowly be getting worse.  She has frequent pad changes, double protection, bleedthrough episodes.  Do not see a recent hemoglobin in her chart.  Was never told that she had any issues in the past other than at her most recent annual exam was told her uterus was enlarged.  Her recent Pap smear was negative.  Past medical history significant for diabetes.  Is scheduled for breast surgery where she had  A phylloides tumor removed with positive margins and they are going back to resect this area.  Is also having issues with her foot/ankle and is seen at [facility] with proposed upcoming surgery.  No history of abnormal Pap smears or other GYN pathology in the past.  Past medical history,surgical history, problem list, medications, allergies, family history and social history were all reviewed and documented in the EPIC chart.  Directed ROS with pertinent positives and negatives documented in the history of present illness/assessment and plan.  Exam: Caryn Bee assistant Vitals:   08/13/17 1412  BP: 124/80  Weight: 212 lb (96.2 kg)  Height: 5\' 2"  (1.575 m)   General appearance:  Normal HEENT with significant chin hirsutism. Abdomen soft nontender without masses guarding rebound Pelvic external BUS vagina normal.  Cervix somewhat distorted anteriorly.  Bimanual with pelvic mass extending to symphysis difficult to tell whether uterine versus ovarian.  Adnexa difficult to evaluate.  Rectal exam normal.  Assessment/Plan:  47 y.o. G0P0000 with:  1. Menorrhagia which seems to be getting worse.  Reviewed the differential to include hormonal versus structural such as adenomyosis, leiomyoma, endometrial defect such as polyps or submucous myomas.  Check baseline CBC, TSH and will start with ultrasound/sonohysterogram to rule out  leiomyoma/endometrial cavity defects that are causing her bleeding.  2. Pelvic mass.  Does not feel classic for leiomyoma and I question whether there is an ovarian process involved.  Discussed with her that although leiomyoma would be the most common expected explanation we need to rule out an ovarian process.  She will follow-up for ultrasound for better definition. 3. Hirsutism.  Significant chin hair the patient says she is always had and felt that it was familial.  No masculinization such as frontal balding or clitorimegaly.  Will check baseline androgen studies to include 17 hydroxyprogesterone, DHEAS and testosterone levels.  Patient will follow-up for these results.  Greater than 50% of my time was spent in direct face to face counseling and coordination of care with the patient.     Anastasio Auerbach MD, 3:10 PM 08/13/2017

## 2017-08-13 NOTE — Patient Instructions (Signed)
Follow-up for the ultrasound as scheduled. 

## 2017-08-14 LAB — DHEA-SULFATE: DHEA-SO4: 73 ug/dL (ref 19–231)

## 2017-08-16 LAB — 17-HYDROXYPROGESTERONE: 17-OH-Progesterone, LC/MS/MS: 70 ng/dL

## 2017-08-17 LAB — CBC WITH DIFFERENTIAL/PLATELET
Basophils Absolute: 82 cells/uL (ref 0–200)
Basophils Relative: 0.8 %
Eosinophils Absolute: 216 cells/uL (ref 15–500)
Eosinophils Relative: 2.1 %
HCT: 35.7 % (ref 35.0–45.0)
Hemoglobin: 12.5 g/dL (ref 11.7–15.5)
Lymphs Abs: 3718 cells/uL (ref 850–3900)
MCH: 31.6 pg (ref 27.0–33.0)
MCHC: 35 g/dL (ref 32.0–36.0)
MCV: 90.2 fL (ref 80.0–100.0)
MPV: 10.6 fL (ref 7.5–12.5)
Monocytes Relative: 8 %
Neutro Abs: 5459 cells/uL (ref 1500–7800)
Neutrophils Relative %: 53 %
Platelets: 319 10*3/uL (ref 140–400)
RBC: 3.96 10*6/uL (ref 3.80–5.10)
RDW: 12.3 % (ref 11.0–15.0)
Total Lymphocyte: 36.1 %
WBC mixed population: 824 cells/uL (ref 200–950)
WBC: 10.3 10*3/uL (ref 3.8–10.8)

## 2017-08-17 LAB — TESTOS,TOTAL,FREE AND SHBG (FEMALE)
Free Testosterone: 2.7 pg/mL (ref 0.1–6.4)
Sex Hormone Binding: 14 nmol/L — ABNORMAL LOW (ref 17–124)
Testosterone, Total, LC-MS-MS: 15 ng/dL (ref 2–45)

## 2017-08-17 LAB — TSH: TSH: 1.86 mIU/L

## 2017-08-18 ENCOUNTER — Encounter (HOSPITAL_BASED_OUTPATIENT_CLINIC_OR_DEPARTMENT_OTHER): Payer: Self-pay | Admitting: *Deleted

## 2017-08-18 ENCOUNTER — Other Ambulatory Visit: Payer: Self-pay

## 2017-08-20 ENCOUNTER — Encounter: Payer: Self-pay | Admitting: Gynecology

## 2017-08-20 NOTE — Telephone Encounter (Signed)
DHEAS, 17 hydroxyprogesterone and testosterone or androgen studies that we will do in patients with excess hair growth that would alert [place] to a abnormal situation such as an adrenal overproduction or an ovarian tumor.  All of the studies were normal pointing towards constitutional hair growth meaning familial and not secondary to an abnormal medical condition

## 2017-08-21 ENCOUNTER — Encounter (HOSPITAL_BASED_OUTPATIENT_CLINIC_OR_DEPARTMENT_OTHER)
Admission: RE | Admit: 2017-08-21 | Discharge: 2017-08-21 | Disposition: A | Discharge status: Home / Self Care | Payer: 59 | Source: Ambulatory Visit | Attending: General Surgery | Admitting: General Surgery

## 2017-08-21 ENCOUNTER — Encounter: Payer: Self-pay | Admitting: Gynecology

## 2017-08-21 DIAGNOSIS — Z01818 Encounter for other preprocedural examination: Secondary | ICD-10-CM | POA: Diagnosis not present

## 2017-08-21 DIAGNOSIS — N6320 Unspecified lump in the left breast, unspecified quadrant: Secondary | ICD-10-CM | POA: Insufficient documentation

## 2017-08-21 DIAGNOSIS — E119 Type 2 diabetes mellitus without complications: Secondary | ICD-10-CM | POA: Diagnosis not present

## 2017-08-21 LAB — BASIC METABOLIC PANEL
Anion gap: 9 (ref 5–15)
BUN: 9 mg/dL (ref 6–20)
CO2: 21 mmol/L — ABNORMAL LOW (ref 22–32)
Calcium: 9 mg/dL (ref 8.9–10.3)
Chloride: 108 mmol/L (ref 101–111)
Creatinine, Ser: 0.59 mg/dL (ref 0.44–1.00)
GFR calc Af Amer: >60 mL/min (ref >60)
GFR calc non Af Amer: >60 mL/min (ref >60)
Glucose, Bld: 177 mg/dL — ABNORMAL HIGH (ref 65–99)
Potassium: 4.5 mmol/L (ref 3.5–5.1)
Sodium: 138 mmol/L (ref 135–145)

## 2017-08-21 NOTE — Progress Notes (Signed)
Ensure pre surgery drink given with instructions to complete by 0830 dos, pt verbalized understanding. 

## 2017-08-25 ENCOUNTER — Other Ambulatory Visit: Payer: Self-pay

## 2017-08-25 ENCOUNTER — Ambulatory Visit (HOSPITAL_BASED_OUTPATIENT_CLINIC_OR_DEPARTMENT_OTHER): Payer: 59 | Admitting: Certified Registered"

## 2017-08-25 ENCOUNTER — Encounter (HOSPITAL_BASED_OUTPATIENT_CLINIC_OR_DEPARTMENT_OTHER): Payer: Self-pay | Admitting: Certified Registered"

## 2017-08-25 ENCOUNTER — Ambulatory Visit (HOSPITAL_BASED_OUTPATIENT_CLINIC_OR_DEPARTMENT_OTHER)
Admission: RE | Admit: 2017-08-25 | Discharge: 2017-08-25 | Disposition: A | Discharge status: Home / Self Care | Payer: 59 | Source: Ambulatory Visit | Attending: General Surgery | Admitting: General Surgery

## 2017-08-25 ENCOUNTER — Encounter (HOSPITAL_BASED_OUTPATIENT_CLINIC_OR_DEPARTMENT_OTHER)
Admission: RE | Disposition: A | Discharge status: Home / Self Care | Payer: Self-pay | Source: Ambulatory Visit | Attending: General Surgery

## 2017-08-25 DIAGNOSIS — E7800 Pure hypercholesterolemia, unspecified: Secondary | ICD-10-CM | POA: Diagnosis not present

## 2017-08-25 DIAGNOSIS — D242 Benign neoplasm of left breast: Secondary | ICD-10-CM | POA: Insufficient documentation

## 2017-08-25 DIAGNOSIS — Z886 Allergy status to analgesic agent status: Secondary | ICD-10-CM | POA: Insufficient documentation

## 2017-08-25 DIAGNOSIS — D4862 Neoplasm of uncertain behavior of left breast: Secondary | ICD-10-CM | POA: Diagnosis present

## 2017-08-25 DIAGNOSIS — Z885 Allergy status to narcotic agent status: Secondary | ICD-10-CM | POA: Insufficient documentation

## 2017-08-25 DIAGNOSIS — Z8249 Family history of ischemic heart disease and other diseases of the circulatory system: Secondary | ICD-10-CM | POA: Insufficient documentation

## 2017-08-25 DIAGNOSIS — F17200 Nicotine dependence, unspecified, uncomplicated: Secondary | ICD-10-CM | POA: Diagnosis not present

## 2017-08-25 DIAGNOSIS — E119 Type 2 diabetes mellitus without complications: Secondary | ICD-10-CM | POA: Insufficient documentation

## 2017-08-25 DIAGNOSIS — N6012 Diffuse cystic mastopathy of left breast: Secondary | ICD-10-CM | POA: Insufficient documentation

## 2017-08-25 DIAGNOSIS — Z6838 Body mass index (BMI) 38.0-38.9, adult: Secondary | ICD-10-CM | POA: Insufficient documentation

## 2017-08-25 DIAGNOSIS — D493 Neoplasm of unspecified behavior of breast: Secondary | ICD-10-CM | POA: Diagnosis not present

## 2017-08-25 HISTORY — PX: RE-EXCISION OF BREAST LUMPECTOMY: SHX6048

## 2017-08-25 SURGERY — EXCISION, LESION, BREAST
Anesthesia: General | Site: Breast | Laterality: Left

## 2017-08-25 MED ORDER — FENTANYL CITRATE (PF) 100 MCG/2ML IJ SOLN
50.0000 ug | INTRAMUSCULAR | Status: DC | PRN
  Administered 2017-08-25 (×2): 50 ug via INTRAVENOUS

## 2017-08-25 MED ORDER — IBUPROFEN 100 MG/5ML PO SUSP: 200.0000 mg | Freq: Four times a day (QID) | ORAL | Status: DC | PRN

## 2017-08-25 MED ORDER — ONDANSETRON HCL 4 MG/2ML IJ SOLN
INTRAMUSCULAR | Status: DC | PRN
  Administered 2017-08-25: 4 mg via INTRAVENOUS

## 2017-08-25 MED ORDER — CEFAZOLIN SODIUM-DEXTROSE 2-4 GM/100ML-% IV SOLN
INTRAVENOUS | Status: AC
  Filled 2017-08-25: qty 100

## 2017-08-25 MED ORDER — CELECOXIB 400 MG PO CAPS
400.0000 mg | ORAL_CAPSULE | ORAL | Status: AC
  Administered 2017-08-25: 400 mg via ORAL

## 2017-08-25 MED ORDER — PROPOFOL 10 MG/ML IV BOLUS
INTRAVENOUS | Status: DC | PRN
  Administered 2017-08-25: 200 mg via INTRAVENOUS

## 2017-08-25 MED ORDER — IBUPROFEN 200 MG PO TABS: 200.0000 mg | ORAL_TABLET | Freq: Four times a day (QID) | ORAL | Status: DC | PRN

## 2017-08-25 MED ORDER — MIDAZOLAM HCL 2 MG/2ML IJ SOLN
INTRAMUSCULAR | Status: AC
  Filled 2017-08-25: qty 2

## 2017-08-25 MED ORDER — BUPIVACAINE HCL (PF) 0.25 % IJ SOLN
INTRAMUSCULAR | Status: DC | PRN
  Administered 2017-08-25: 20 mL

## 2017-08-25 MED ORDER — SCOPOLAMINE 1 MG/3DAYS TD PT72: 1.0000 | MEDICATED_PATCH | Freq: Once | TRANSDERMAL | Status: DC | PRN

## 2017-08-25 MED ORDER — GABAPENTIN 300 MG PO CAPS
300.0000 mg | ORAL_CAPSULE | ORAL | Status: AC
  Administered 2017-08-25: 300 mg via ORAL

## 2017-08-25 MED ORDER — ONDANSETRON HCL 4 MG/2ML IJ SOLN: 4.0000 mg | Freq: Once | INTRAMUSCULAR | Status: DC | PRN

## 2017-08-25 MED ORDER — ACETAMINOPHEN 500 MG PO TABS
1000.0000 mg | ORAL_TABLET | ORAL | Status: AC
  Administered 2017-08-25: 1000 mg via ORAL

## 2017-08-25 MED ORDER — HYDROCODONE-ACETAMINOPHEN 10-325 MG PO TABS: 1.0000 | ORAL_TABLET | Freq: Four times a day (QID) | ORAL | 0 refills | Status: DC | PRN

## 2017-08-25 MED ORDER — MIDAZOLAM HCL 2 MG/2ML IJ SOLN
1.0000 mg | INTRAMUSCULAR | Status: DC | PRN
  Administered 2017-08-25: 2 mg via INTRAVENOUS

## 2017-08-25 MED ORDER — DEXAMETHASONE SODIUM PHOSPHATE 4 MG/ML IJ SOLN
INTRAMUSCULAR | Status: DC | PRN
  Administered 2017-08-25: 10 mg via INTRAVENOUS

## 2017-08-25 MED ORDER — FENTANYL CITRATE (PF) 100 MCG/2ML IJ SOLN
INTRAMUSCULAR | Status: AC
  Filled 2017-08-25: qty 2

## 2017-08-25 MED ORDER — ONDANSETRON HCL 4 MG/2ML IJ SOLN
INTRAMUSCULAR | Status: AC
  Filled 2017-08-25: qty 4

## 2017-08-25 MED ORDER — ENSURE PRE-SURGERY PO LIQD: 592.0000 mL | Freq: Once | ORAL | Status: DC

## 2017-08-25 MED ORDER — CELECOXIB 200 MG PO CAPS
ORAL_CAPSULE | ORAL | Status: AC
  Filled 2017-08-25: qty 1

## 2017-08-25 MED ORDER — ACETAMINOPHEN 500 MG PO TABS
ORAL_TABLET | ORAL | Status: AC
  Filled 2017-08-25: qty 2

## 2017-08-25 MED ORDER — CEFAZOLIN SODIUM-DEXTROSE 2-4 GM/100ML-% IV SOLN
2.0000 g | INTRAVENOUS | Status: AC
  Administered 2017-08-25: 2 g via INTRAVENOUS

## 2017-08-25 MED ORDER — CELECOXIB 200 MG PO CAPS
ORAL_CAPSULE | ORAL | Status: AC
Start: 2017-08-25 — End: 2017-08-25
  Filled 2017-08-25: qty 1

## 2017-08-25 MED ORDER — KETOROLAC TROMETHAMINE 30 MG/ML IJ SOLN: 30.0000 mg | Freq: Once | INTRAMUSCULAR | Status: DC | PRN

## 2017-08-25 MED ORDER — FENTANYL CITRATE (PF) 100 MCG/2ML IJ SOLN
25.0000 ug | INTRAMUSCULAR | Status: DC | PRN
  Administered 2017-08-25: 50 ug via INTRAVENOUS

## 2017-08-25 MED ORDER — GABAPENTIN 300 MG PO CAPS
ORAL_CAPSULE | ORAL | Status: AC
  Filled 2017-08-25: qty 1

## 2017-08-25 MED ORDER — LIDOCAINE HCL (CARDIAC) 20 MG/ML IV SOLN
INTRAVENOUS | Status: DC | PRN
  Administered 2017-08-25: 60 mg via INTRAVENOUS

## 2017-08-25 MED ORDER — PROPOFOL 10 MG/ML IV BOLUS
INTRAVENOUS | Status: AC
  Filled 2017-08-25: qty 20

## 2017-08-25 MED ORDER — LACTATED RINGERS IV SOLN
INTRAVENOUS | Status: DC
  Administered 2017-08-25 (×2): via INTRAVENOUS

## 2017-08-25 MED ORDER — MEPERIDINE HCL 25 MG/ML IJ SOLN: 6.2500 mg | INTRAMUSCULAR | Status: DC | PRN

## 2017-08-25 SURGICAL SUPPLY — 55 items
BINDER BREAST LRG (GAUZE/BANDAGES/DRESSINGS) IMPLANT
BINDER BREAST MEDIUM (GAUZE/BANDAGES/DRESSINGS) IMPLANT
BINDER BREAST XLRG (GAUZE/BANDAGES/DRESSINGS) ×3 IMPLANT
BINDER BREAST XXLRG (GAUZE/BANDAGES/DRESSINGS) IMPLANT
BLADE SURG 15 STRL LF DISP TIS (BLADE) ×2 IMPLANT
BLADE SURG 15 STRL SS (BLADE) ×1
CANISTER SUCT 1200ML W/VALVE (MISCELLANEOUS) ×3 IMPLANT
CHLORAPREP W/TINT 26ML (MISCELLANEOUS) ×3 IMPLANT
CLIP VESOCCLUDE SM WIDE 6/CT (CLIP) ×3 IMPLANT
COVER BACK TABLE 60X90IN (DRAPES) ×3 IMPLANT
COVER MAYO STAND STRL (DRAPES) ×3 IMPLANT
DECANTER SPIKE VIAL GLASS SM (MISCELLANEOUS) IMPLANT
DERMABOND ADVANCED (GAUZE/BANDAGES/DRESSINGS) ×1
DERMABOND ADVANCED .7 DNX12 (GAUZE/BANDAGES/DRESSINGS) ×2 IMPLANT
DEVICE DUBIN W/COMP PLATE 8390 (MISCELLANEOUS) IMPLANT
DRAPE LAPAROSCOPIC ABDOMINAL (DRAPES) ×3 IMPLANT
DRAPE UTILITY XL STRL (DRAPES) ×3 IMPLANT
DRSG TEGADERM 4X4.75 (GAUZE/BANDAGES/DRESSINGS) IMPLANT
ELECT COATED BLADE 2.86 ST (ELECTRODE) ×3 IMPLANT
ELECT REM PT RETURN 9FT ADLT (ELECTROSURGICAL) ×3
ELECTRODE REM PT RTRN 9FT ADLT (ELECTROSURGICAL) ×2 IMPLANT
GAUZE SPONGE 4X4 12PLY STRL LF (GAUZE/BANDAGES/DRESSINGS) IMPLANT
GLOVE BIO SURGEON STRL SZ7 (GLOVE) ×6 IMPLANT
GLOVE BIOGEL PI IND STRL 7.0 (GLOVE) ×4 IMPLANT
GLOVE BIOGEL PI IND STRL 7.5 (GLOVE) ×2 IMPLANT
GLOVE BIOGEL PI INDICATOR 7.0 (GLOVE) ×2
GLOVE BIOGEL PI INDICATOR 7.5 (GLOVE) ×1
GLOVE ECLIPSE 6.5 STRL STRAW (GLOVE) ×3 IMPLANT
GOWN STRL REUS W/ TWL LRG LVL3 (GOWN DISPOSABLE) ×6 IMPLANT
GOWN STRL REUS W/TWL LRG LVL3 (GOWN DISPOSABLE) ×3
HEMOSTAT ARISTA ABSORB 3G PWDR (MISCELLANEOUS) ×3 IMPLANT
ILLUMINATOR WAVEGUIDE N/F (MISCELLANEOUS) IMPLANT
LIGHT WAVEGUIDE WIDE FLAT (MISCELLANEOUS) IMPLANT
NEEDLE HYPO 25X1 1.5 SAFETY (NEEDLE) ×3 IMPLANT
NS IRRIG 1000ML POUR BTL (IV SOLUTION) IMPLANT
PACK BASIN DAY SURGERY FS (CUSTOM PROCEDURE TRAY) ×3 IMPLANT
PENCIL BUTTON HOLSTER BLD 10FT (ELECTRODE) ×3 IMPLANT
SLEEVE SCD COMPRESS KNEE MED (MISCELLANEOUS) ×3 IMPLANT
SPONGE LAP 4X18 X RAY DECT (DISPOSABLE) ×3 IMPLANT
STRIP CLOSURE SKIN 1/2X4 (GAUZE/BANDAGES/DRESSINGS) ×3 IMPLANT
SUT MNCRL AB 3-0 PS2 18 (SUTURE) IMPLANT
SUT MNCRL AB 4-0 PS2 18 (SUTURE) IMPLANT
SUT MON AB 5-0 PS2 18 (SUTURE) ×3 IMPLANT
SUT SILK 2 0 SH (SUTURE) ×6 IMPLANT
SUT VIC AB 2-0 SH 27 (SUTURE) ×1
SUT VIC AB 2-0 SH 27XBRD (SUTURE) ×2 IMPLANT
SUT VIC AB 3-0 SH 27 (SUTURE) ×1
SUT VIC AB 3-0 SH 27X BRD (SUTURE) ×2 IMPLANT
SUT VIC AB 5-0 PS2 18 (SUTURE) IMPLANT
SUT VICRYL AB 3 0 TIES (SUTURE) IMPLANT
SYR CONTROL 10ML LL (SYRINGE) ×3 IMPLANT
TOWEL OR 17X24 6PK STRL BLUE (TOWEL DISPOSABLE) ×3 IMPLANT
TOWEL OR NON WOVEN STRL DISP B (DISPOSABLE) ×3 IMPLANT
TUBE CONNECTING 20X1/4 (TUBING) ×3 IMPLANT
YANKAUER SUCT BULB TIP NO VENT (SUCTIONS) ×3 IMPLANT

## 2017-08-25 NOTE — Discharge Instructions (Signed)
[facility] °Office Phone Number [phone] ° °POST OP INSTRUCTIONS ° °Always review your discharge instruction sheet given to you by the facility where your surgery was performed. ° °IF YOU HAVE DISABILITY OR FAMILY LEAVE FORMS, YOU MUST BRING THEM TO THE OFFICE FOR PROCESSING.  DO NOT GIVE THEM TO YOUR DOCTOR. ° °1. A prescription for pain medication may be given to you upon discharge.  Take your pain medication as prescribed, if needed.  If narcotic pain medicine is not needed, then you may take acetaminophen (Tylenol), naprosyn (Alleve) or ibuprofen (Advil) as needed. °2. Take your usually prescribed medications unless otherwise directed °3. If you need a refill on your pain medication, please contact your pharmacy.  They will contact our office to request authorization.  Prescriptions will not be filled after 5pm or on week-ends. °4. You should eat very light the first 24 hours after surgery, such as soup, crackers, pudding, etc.  Resume your normal diet the day after surgery. °5. Most patients will experience some swelling and bruising in the breast.  Ice packs and a good support bra will help.  Wear the breast binder provided or a sports bra for 72 hours day and night.  After that wear a sports bra during the day until you return to the office. Swelling and bruising can take several days to resolve.  °6. It is common to experience some constipation if taking pain medication after surgery.  Increasing fluid intake and taking a stool softener will usually help or prevent this problem from occurring.  A mild laxative (Milk of Magnesia or Miralax) should be taken according to package directions if there are no bowel movements after 48 hours. °7. Unless discharge instructions indicate otherwise, you may remove your bandages 48 hours after surgery and you may shower at that time.  You may have steri-strips (small skin tapes) in place directly over the incision.  These strips should be left on the  skin for 7-10 days and will come off on their own.  If your surgeon used skin glue on the incision, you may shower in 24 hours.  The glue will flake off over the next 2-3 weeks.  Any sutures or staples will be removed at the office during your follow-up visit. °8. ACTIVITIES:  You may resume regular daily activities (gradually increasing) beginning the next day.  Wearing a good support bra or sports bra minimizes pain and swelling.  You may have sexual intercourse when it is comfortable. °a. You may drive when you no longer are taking prescription pain medication, you can comfortably wear a seatbelt, and you can safely maneuver your car and apply brakes. °b. RETURN TO WORK:  ______________________________________________________________________________________ °9. You should see your doctor in the office for a follow-up appointment approximately two weeks after your surgery.  Your doctor’s nurse will typically make your follow-up appointment when she calls you with your pathology report.  Expect your pathology report 3-4 business days after your surgery.  You may call to check if you do not hear from us after three days. °10. OTHER INSTRUCTIONS: _______________________________________________________________________________________________ _____________________________________________________________________________________________________________________________________ °_____________________________________________________________________________________________________________________________________ °_____________________________________________________________________________________________________________________________________ ° °WHEN TO CALL DR WAKEFIELD: °1. Fever over 101.0 °2. Nausea and/or vomiting. °3. Extreme swelling or bruising. °4. Continued bleeding from incision. °5. Increased pain, redness, or drainage from the incision. ° °The clinic staff is available to answer your questions during regular  business hours.  Please don’t hesitate to call and ask to speak to one of the nurses for clinical concerns.  If   you have a medical emergency, go to the nearest emergency room or call 911.  A surgeon from [facility] is always on call at the hospital. ° °For further questions, please visit [email or web address] ° ° ° ° ° °Post Anesthesia Home Care Instructions ° °Activity: °Get plenty of rest for the remainder of the day. A responsible individual must stay with you for 24 hours following the procedure.  °For the next 24 hours, DO NOT: °-Drive a car °-Operate machinery °-Drink alcoholic beverages °-Take any medication unless instructed by your physician °-Make any legal decisions or sign important papers. ° °Meals: °Start with liquid foods such as gelatin or soup. Progress to regular foods as tolerated. Avoid greasy, spicy, heavy foods. If nausea and/or vomiting occur, drink only clear liquids until the nausea and/or vomiting subsides. Call your physician if vomiting continues. ° °Special Instructions/Symptoms: °Your throat may feel dry or sore from the anesthesia or the breathing tube placed in your throat during surgery. If this causes discomfort, gargle with warm salt water. The discomfort should disappear within 24 hours. ° °If you had a scopolamine patch placed behind your ear for the management of post- operative nausea and/or vomiting: ° °1. The medication in the patch is effective for 72 hours, after which it should be removed.  Wrap patch in a tissue and discard in the trash. Wash hands thoroughly with soap and water. °2. You may remove the patch earlier than 72 hours if you experience unpleasant side effects which may include dry mouth, dizziness or visual disturbances. °3. Avoid touching the patch. Wash your hands with soap and water after contact with the patch. °  ° °

## 2017-08-25 NOTE — Transfer of Care (Signed)
Immediate Anesthesia Transfer of Care Note  Patient: Jody Duke  Procedure(s) Performed: LEFT RE-EXCISION OF BREAST MARGINS ERAS PATHWAY (Left Breast)  Patient Location: PACU  Anesthesia Type:General  Level of Consciousness: awake and patient cooperative  Airway & Oxygen Therapy: Patient Spontanous Breathing and Patient connected to face mask oxygen  Post-op Assessment: Report given to RN and Post -op Vital signs reviewed and stable  Post vital signs: Reviewed and stable  Last Vitals:  Vitals:   08/25/17 1015  BP: 132/76  Pulse: 84  Resp: 18  Temp: 36.5 C  SpO2: 99%    Last Pain: There were no vitals filed for this visit.       Complications: No apparent anesthesia complications

## 2017-08-25 NOTE — Interval H&P Note (Signed)
History and Physical Interval Note:  08/25/2017 11:44 AM  Jody Duke  has presented today for surgery, with the diagnosis of tumor  The various methods of treatment have been discussed with the patient and family. After consideration of risks, benefits and other options for treatment, the patient has consented to  Procedure(s): LEFT RE-EXCISION OF BREAST [name] (Left) as a surgical intervention .  The patient's history has been reviewed, patient examined, no change in status, stable for surgery.  I have reviewed the patient's chart and labs.  Questions were answered to the patient's satisfaction.     Rolm Bookbinder

## 2017-08-25 NOTE — Anesthesia Preprocedure Evaluation (Signed)
Anesthesia Evaluation  Patient identified by MRN, date of birth, ID band Patient awake    Reviewed: Allergy & Precautions, NPO status , Patient's Chart, lab work & pertinent test results  Airway Mallampati: II  TM Distance: >3 FB Neck ROM: Full    Dental no notable dental hx. (+) Teeth Intact, Dental Advisory Given   Pulmonary Current Smoker,    Pulmonary exam normal breath sounds clear to auscultation       Cardiovascular Exercise Tolerance: Good negative cardio ROS Normal cardiovascular exam Rhythm:Regular Rate:Normal     Neuro/Psych  Headaches, negative psych ROS   GI/Hepatic negative GI ROS, Neg liver ROS,   Endo/Other  diabetesMorbid obesity  Renal/GU negative Renal ROS  negative genitourinary   Musculoskeletal negative musculoskeletal ROS (+)   Abdominal (+) + obese,   Peds  Hematology negative hematology ROS (+)   Anesthesia Other Findings Day of surgery medications reviewed with the patient.  Left breast mass  Reproductive/Obstetrics                             Anesthesia Physical  Anesthesia Plan  ASA: III  Anesthesia Plan: General   Post-op Pain Management:    Induction: Intravenous  PONV Risk Score and Plan: 2 and Dexamethasone and Ondansetron  Airway Management Planned: LMA  Additional Equipment:   Intra-op Plan:   Post-operative Plan: Extubation in OR  Informed Consent: I have reviewed the patients History and Physical, chart, labs and discussed the procedure including the risks, benefits and alternatives for the proposed anesthesia with the patient or authorized representative who has indicated his/her understanding and acceptance.   Dental advisory given  Plan Discussed with: CRNA  Anesthesia Plan Comments: (Risks/benefits of general anesthesia discussed with patient including risk of damage to teeth, lips, gum, and tongue, nausea/vomiting, allergic  reactions to medications, and the possibility of heart attack, stroke and death.  All patient questions answered.  Patient wishes to proceed.)        Anesthesia Quick Evaluation

## 2017-08-25 NOTE — Anesthesia Procedure Notes (Signed)
Procedure Name: LMA Insertion Date/Time: 08/25/2017 11:59 AM Performed by: Signe Colt, CRNA Pre-anesthesia Checklist: Patient identified, Emergency Drugs available, Suction available and Patient being monitored Patient Re-evaluated:Patient Re-evaluated prior to induction Oxygen Delivery Method: Circle system utilized Preoxygenation: Pre-oxygenation with 100% oxygen Induction Type: IV induction Ventilation: Mask ventilation without difficulty LMA: LMA inserted LMA Size: 4.0 Number of attempts: 1 Airway Equipment and Method: Bite block Placement Confirmation: positive ETCO2 Tube secured with: Tape Dental Injury: Teeth and Oropharynx as per pre-operative assessment

## 2017-08-25 NOTE — Op Note (Signed)
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 Serita Grammes Anesthesia: Gen. Estimated blood loss: minimal Complications: None Drains: None Specimens:Left breast anterior, posterior, lateral , medial, superior and inferior margins all marked short superior, long lateral and double deep Sponge and needle count correct at completion Disposition to recovery stable  Indications: This is a11 yof I removed a left breast mass that on core was concerning for possible phyllodes. At surgery it enucleated easily so I did not remove more.  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  We discussed options including observation which I think was reasonable but she desires excision.   Procedure: After informed consent was obtained she was then taken to the operating room. She was given antibiotics.Sequential compression devices were on her legs. She was placed under general anesthesia without complication. Her chestwas then prepped and draped in the standard sterile surgical fashion. A surgical timeout was then performed.   I infiltrated marcaine and reentered the old incision.  I removed all sutures that I had reattached the breast tissue.  I then had the entire cavity exposed. I removed all the margins of the cavity at above and marked with silk sutures. This was then all sent to pathology. Hemostasis was observed. I closed the breast tissue with a 2-0 Vicryl. The dermis was closed with 3-0 Vicryl and the skin with 5-0 Monocryl.Dermabond and steristrips were placed on the incision. A breast binder was placed. She was transferred to recovery stable

## 2017-08-25 NOTE — H&P (Signed)
34 yof referred by Dr Tamala Julian for left breast mass. she noted this in early December. no nipple dc. no prior breast history. fh in mgm. she underwent mm that shows c density breasts. there is a large lobular mass in uo left breast. there is also in upper inner left breast a low density lobular mass. in uoq right breast there is oval circumscribed mass. US shows a 3.3x2.5x3.3 cm mass left breast uoq. there is a 8 mm cluster of cysts in left breast and there is a 1.3 cm oval mass in right brest. she underwent biopsy of two masses. 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 She is here doing well postop.   Past Surgical History  No pertinent past surgical history   Diagnostic Studies History Colonoscopy  never Mammogram  within last year  Allergies  Percocet *ANALGESICS - OPIOID*  Allergies Reconciled   Medication History  TraMADol HCl (50MG  Tablet, Oral) Active. IBU (400MG  Tablet, Oral) Active. Medications Reconciled  Social History Alcohol use  Occasional alcohol use. Caffeine use  Carbonated beverages, Coffee. No drug use  Tobacco use  Current every day smoker.  Family History  Alcohol Abuse  Mother. Arthritis  Father. Depression  Mother. Diabetes Mellitus  Family Members In General, Father, Mother. Heart Disease  Father. Hypertension  Brother, Family Members In [place], Father, Mother. Kidney Disease  Mother. Malignant Neoplasm Of Pancreas  Family Members In General. Migraine Headache  Father. Thyroid problems  Family Members In General, Mother.   Review of Systems  General Present- Night Sweats, Weight Gain and Weight Loss. Not Present- Appetite Loss, Chills, Fatigue and Fever. Skin Present- Change in Wart/Mole. Not Present- Dryness, Hives, Jaundice, New  Lesions, Non-Healing Wounds, Rash and Ulcer. HEENT Present- Ringing in the Ears. Not Present- Earache, Hearing Loss, Hoarseness, Nose Bleed, Oral Ulcers, Seasonal Allergies, Sinus Pain, Sore Throat, Visual Disturbances, Wears glasses/contact lenses and Yellow Eyes. Respiratory Present- Snoring and Wheezing. Not Present- Bloody sputum, Chronic Cough and Difficulty Breathing. Breast Present- Breast Mass and Breast Pain. Not Present- Nipple Discharge and Skin Changes. Cardiovascular Present- Leg Cramps. Not Present- Chest Pain, Difficulty Breathing Lying Down, Palpitations, Rapid Heart Rate, Shortness of Breath and Swelling of Extremities. Gastrointestinal Not Present- Abdominal Pain, Bloating, Bloody Stool, Change in Bowel Habits, Chronic diarrhea, Constipation, Difficulty Swallowing, Excessive gas, Gets full quickly at meals, Hemorrhoids, Indigestion, Nausea, Rectal Pain and Vomiting. Female Genitourinary Present- Urgency. Not Present- Frequency, Nocturia, Painful Urination and Pelvic Pain. Musculoskeletal Not Present- Back Pain, Joint Pain, Joint Stiffness, Muscle Pain, Muscle Weakness and Swelling of Extremities. Neurological Not Present- Decreased Memory, Fainting, Headaches, Numbness, Seizures, Tingling, Tremor, Trouble walking and Weakness. Psychiatric Not Present- Anxiety, Bipolar, Change in Sleep Pattern, Depression, Fearful and Frequent crying. Endocrine Present- Hair Changes and Hot flashes. Not Present- Cold Intolerance, Excessive Hunger, Heat Intolerance and New Diabetes.  General Mental Status-Alert. Orientation-Oriented X3. Head and Neck Trachea-midline. Thyroid Gland Characteristics - normal size and consistency. Eye Sclera/Conjunctiva - Bilateral-No scleral icterus. Chest and Lung Exam Chest and lung exam reveals -quiet, even and easy respiratory effort with no use of accessory muscles and on auscultation, normal breath sounds, no adventitious sounds and normal vocal  resonance. Breast Nipples-No Discharge. Healing left breast incision Cardiovascular Cardiovascular examination reveals -normal heart sounds, regular rate and rhythm with no murmurs. Lymphatic Head & Neck General Head &  Neck Lymphatics: Bilateral - Description - Normal. Axillary General Axillary Region: Bilateral - Description - Normal. Note: no Yznaga adenopathy   A/P: Re-excision left breast phyllodes tumor Discussed options of observation which I would have been fine with but she desires excision.

## 2017-08-26 ENCOUNTER — Encounter (HOSPITAL_BASED_OUTPATIENT_CLINIC_OR_DEPARTMENT_OTHER): Payer: Self-pay | Admitting: General Surgery

## 2017-08-26 DIAGNOSIS — R2242 Localized swelling, mass and lump, left lower limb: Secondary | ICD-10-CM | POA: Diagnosis not present

## 2017-08-26 NOTE — Anesthesia Postprocedure Evaluation (Signed)
Anesthesia Post Note  Patient: Jody Duke  Procedure(s) Performed: LEFT RE-EXCISION OF BREAST MARGINS ERAS PATHWAY (Left Breast)     Patient location during evaluation: PACU Anesthesia Type: General Level of consciousness: awake Pain management: pain level controlled Vital Signs Assessment: post-procedure vital signs reviewed and stable Respiratory status: spontaneous breathing Cardiovascular status: stable Postop Assessment: no apparent nausea or vomiting Anesthetic complications: no    Last Vitals:  Vitals:   08/25/17 1342 08/25/17 1422  BP:  131/72  Pulse: 81 86  Resp: 18 16  Temp:  36.7 C  SpO2: 100%     Last Pain:  Vitals:   08/25/17 1422  PainSc: 0-No pain   Pain Goal:                 Sadiel Mota JR,JOHN Gaetan Spieker

## 2017-08-27 ENCOUNTER — Encounter: Payer: Self-pay | Admitting: Family Medicine

## 2017-08-28 ENCOUNTER — Encounter: Payer: Self-pay | Admitting: Family Medicine

## 2017-08-28 ENCOUNTER — Other Ambulatory Visit: Payer: Self-pay | Admitting: Gynecology

## 2017-08-28 DIAGNOSIS — N939 Abnormal uterine and vaginal bleeding, unspecified: Secondary | ICD-10-CM

## 2017-08-28 DIAGNOSIS — R1900 Intra-abdominal and pelvic swelling, mass and lump, unspecified site: Secondary | ICD-10-CM

## 2017-09-04 ENCOUNTER — Ambulatory Visit (INDEPENDENT_AMBULATORY_CARE_PROVIDER_SITE_OTHER): Payer: 59 | Admitting: Gynecology

## 2017-09-04 ENCOUNTER — Encounter: Payer: Self-pay | Admitting: Gynecology

## 2017-09-04 ENCOUNTER — Ambulatory Visit (INDEPENDENT_AMBULATORY_CARE_PROVIDER_SITE_OTHER): Payer: 59

## 2017-09-04 ENCOUNTER — Other Ambulatory Visit: Payer: Self-pay | Admitting: Gynecology

## 2017-09-04 VITALS — BP 138/84

## 2017-09-04 DIAGNOSIS — N939 Abnormal uterine and vaginal bleeding, unspecified: Secondary | ICD-10-CM

## 2017-09-04 DIAGNOSIS — R1900 Intra-abdominal and pelvic swelling, mass and lump, unspecified site: Secondary | ICD-10-CM

## 2017-09-04 DIAGNOSIS — N858 Other specified noninflammatory disorders of uterus: Secondary | ICD-10-CM | POA: Diagnosis not present

## 2017-09-04 DIAGNOSIS — N838 Other noninflammatory disorders of ovary, fallopian tube and broad ligament: Secondary | ICD-10-CM

## 2017-09-04 DIAGNOSIS — N924 Excessive bleeding in the premenopausal period: Secondary | ICD-10-CM

## 2017-09-04 MED ORDER — TRAMADOL HCL 50 MG PO TABS: 50.0000 mg | ORAL_TABLET | Freq: Three times a day (TID) | ORAL | 0 refills | Status: DC | PRN

## 2017-09-04 NOTE — Progress Notes (Addendum)
    Jody Duke March 13, 1971 [id number]        47 y.o.  G0P0000 presents for sonohysterogram due to accelerating heavy menses and enlarged pelvic mass suspicious for leiomyoma.  Recently had phylloides tumor removed from her breast  Past medical history,surgical history, problem list, medications, allergies, family history and social history were all reviewed and documented in the EPIC chart.  Directed ROS with pertinent positives and negatives documented in the history of present illness/assessment and plan.  Exam: [place] assistant Vitals:   09/04/17 0936  BP: 138/84   General appearance:  Normal Abdomen soft nontender without masses or guarding Pelvic external BUS vagina normal.  Cervix somewhat displaced anteriorly.  Bimanual with pelvic mass extending to above symphysis midline but nontender.  Adnexa unable to evaluate  Ultrasound transvaginal and transabdominal shows uterus normal size and echotexture.  Endometrial echo 4.4 mm.  Right adnexa with 15 cm cystic multiloculated mass with positive flow.  Solid area also noted at 54 x 50 mm.  Left adnexa with multiloculated mass 10cm with positive flow.  Cul-de-sac negative.  Sonohysterogram performed, sterile technique, easy catheter introduction, good distention with no abnormalities visualized.  Endometrial biopsy taken.  Assessment/Plan:  47 y.o. G0P0000 with menorrhagia and bulky pelvic mass which on ultrasound appears to be bilateral multi cystic ovarian masses with solid areas and positive flow suspicious for carcinoma.  I reviewed the differential with the patient to include the diagnosis of ovarian cancer.  Recommend baseline CA 125 now, baseline CT scan and referral to gynecologic oncologist for surgery.  I reviewed with the patient the need for surgical evaluation and treatment and she understands the situation.  She will call me if there is any issues arranging the appointments.  She is having discomfort which I attribute to these  masses.  She is on tramadol for an ankle injury but is at the end of her prescription and I refilled her tramadol 50 mg #30 to help with her pain.    Anastasio Auerbach MD, 9:54 AM 09/04/2017

## 2017-09-04 NOTE — Patient Instructions (Signed)
Follow up for the GYN Oncology Appt as arranged

## 2017-09-05 ENCOUNTER — Encounter (HOSPITAL_BASED_OUTPATIENT_CLINIC_OR_DEPARTMENT_OTHER): Payer: Self-pay | Admitting: General Surgery

## 2017-09-05 ENCOUNTER — Telehealth: Payer: Self-pay

## 2017-09-05 ENCOUNTER — Telehealth: Payer: Self-pay | Admitting: *Deleted

## 2017-09-05 DIAGNOSIS — N838 Other noninflammatory disorders of ovary, fallopian tube and broad ligament: Secondary | ICD-10-CM

## 2017-09-05 LAB — CA 125: CA 125: 825 U/mL — ABNORMAL HIGH (ref <35)

## 2017-09-05 NOTE — Telephone Encounter (Signed)
-----   Message from Anastasio Auerbach, MD sent at 09/04/2017  9:59 AM EST ----- Patient needs: 1.  CT scan with and without contrast reference large bilateral ovarian masses 2.  Appointment with gynecologic oncologist reference patient with probable ovarian cancer.

## 2017-09-05 NOTE — Telephone Encounter (Signed)
I called [facility] and received PA for CT of pelvis with and without contrast (CPT [id number]) scheduled for patient.  Case reference # is [id number].  Authorization # is [id number] valid 09/05/17 - 10/20/17.

## 2017-09-05 NOTE — Telephone Encounter (Signed)
1. Pt scheduled at [facility] 09/18/17 for CT scan   2. Appointment with Dr.Rossi on 09/19/17 @ 10:15am   Pt informed with all the above.

## 2017-09-05 NOTE — Telephone Encounter (Signed)
Order placed at [facility] I was informed they will call patient to schedule to ask screening questions. (office policy) I called pt and told her this as well, gave her the # to call if she doesn't hear from them. I told her once CT scan scheduled, the oncology appointment can be scheduled. Pt verbalized she understood.

## 2017-09-08 ENCOUNTER — Ambulatory Visit: Payer: 59 | Admitting: Skilled Nursing Facility1

## 2017-09-08 ENCOUNTER — Encounter: Payer: Self-pay | Admitting: Gynecology

## 2017-09-08 NOTE — Telephone Encounter (Signed)
Per Dr.Fontaine staff message "Make sure patient has appointment with gynecologic oncologist as soon as possible. I already sent this out but want to make sure this is an accelerated appointment. She does not have to wait till the CT scan if that would be a hold up but can have it done after her appointment.   Pt now scheduled on 09/10/17 @ 9:30am, pt aware.

## 2017-09-08 NOTE — Telephone Encounter (Signed)
I tried calling the patient at home/work but unable to connect with her.  The biopsy from the uterus was normal.  With the elevated CA 125 and the ovarian cystic changes on ultrasound everything points towards an ovarian tumor.  This is what the gynecologic oncologist will address for ongoing treatment which I suspect will involve surgery.

## 2017-09-08 NOTE — Telephone Encounter (Signed)
Dr. Loetta Rough- I spoke with her. I saw your result note and called her and told her . She was fine with it.  I also, emailed her back and was hoping it would show on your end so you wouldn't have to address this. thanks

## 2017-09-10 ENCOUNTER — Encounter: Payer: Self-pay | Admitting: Gynecologic Oncology

## 2017-09-10 ENCOUNTER — Inpatient Hospital Stay: Payer: 59 | Attending: Gynecologic Oncology | Admitting: Gynecologic Oncology

## 2017-09-10 ENCOUNTER — Encounter (INDEPENDENT_AMBULATORY_CARE_PROVIDER_SITE_OTHER): Payer: Self-pay

## 2017-09-10 VITALS — BP 130/82 | HR 94 | Temp 98.4°F | Resp 20 | Wt 217.0 lb

## 2017-09-10 DIAGNOSIS — R971 Elevated cancer antigen 125 [CA 125]: Secondary | ICD-10-CM | POA: Diagnosis not present

## 2017-09-10 DIAGNOSIS — N971 Female infertility of tubal origin: Secondary | ICD-10-CM

## 2017-09-10 DIAGNOSIS — N83201 Unspecified ovarian cyst, right side: Secondary | ICD-10-CM | POA: Diagnosis present

## 2017-09-10 DIAGNOSIS — N83202 Unspecified ovarian cyst, left side: Secondary | ICD-10-CM

## 2017-09-10 NOTE — Patient Instructions (Signed)
Preparing for your Surgery  Plan for surgery on October 02, 2017 with Dr. Precious Haws.  You will be scheduled for an exploratory laparotomy, total abdominal hysterectomy, bilateral salpingo-oophorectomy, possible staging, possible debulking.  Plan to meet with Dr. Precious Haws on April 1 for your pre-op appointment.  Pre-operative Testing -You will receive a phone call from presurgical testing at [facility] to arrange for a pre-operative testing appointment before your surgery.  This appointment normally occurs one to two weeks before your scheduled surgery.   -Bring your insurance card, copy of an advanced directive if applicable, medication list  -At that visit, you will be asked to sign a consent for a possible blood transfusion in case a transfusion becomes necessary during surgery.  The need for a blood transfusion is rare but having consent is a necessary part of your care.     -You should not be taking blood thinners, aspirin, or ibuprofen at least ten days prior to surgery unless instructed by your surgeon.  Day Before Surgery at [facility] will be asked to take in a light diet the day before surgery.  Avoid carbonated beverages.  You will be advised to have nothing to eat or drink after midnight the evening before.    Eat a light diet the day before surgery.  Examples including soups, broths, toast, yogurt, mashed potatoes.  Things to avoid include carbonated beverages (fizzy beverages), raw fruits and raw vegetables, or beans.   If your bowels are filled with gas, your surgeon will have difficulty visualizing your pelvic organs which increases your surgical risks.  Your role in recovery Your role is to become active as soon as directed by your doctor, while still giving yourself time to heal.  Rest when you feel tired. You will be asked to do the following in order to speed your recovery:  - Cough and breathe deeply. This helps toclear and expand your lungs  and can prevent pneumonia. You may be given a spirometer to practice deep breathing. A staff member will show you how to use the spirometer. - Do mild physical activity. Walking or moving your legs help your circulation and body functions return to normal. A staff member will help you when you try to walk and will provide you with simple exercises. Do not try to get up or walk alone the first time. - Actively manage your pain. Managing your pain lets you move in comfort. We will ask you to rate your pain on a scale of zero to 10. It is your responsibility to tell your doctor or nurse where and how much you hurt so your pain can be treated.  Special Considerations -If you are diabetic, you may be placed on insulin after surgery to have closer control over your blood sugars to promote healing and recovery.  This does not mean that you will be discharged on insulin.  If applicable, your oral antidiabetics will be resumed when you are tolerating a solid diet.  -Your final pathology results from surgery should be available by the Friday after surgery and the results will be relayed to you when available.  -Dr. Lahoma Crocker is the Surgeon that assists your GYN Oncologist with surgery.  The next day after your surgery you will either see your GYN Oncologist or Dr. Lahoma Crocker.   Blood Transfusion Information WHAT IS A BLOOD TRANSFUSION? A transfusion is the replacement of blood or some of its parts. Blood is made up of multiple cells which provide  different functions.  Red blood cells carry oxygen and are used for blood loss replacement.  White blood cells fight against infection.  Platelets control bleeding.  Plasma helps clot blood.  Other blood products are available for specialized needs, such as hemophilia or other clotting disorders. BEFORE THE TRANSFUSION  Who gives blood for transfusions?   You may be able to donate blood to be used at a later date on yourself (autologous  donation).  Relatives can be asked to donate blood. This is generally not any safer than if you have received blood from a stranger. The same precautions are taken to ensure safety when a relative's blood is donated.  Healthy volunteers who are fully evaluated to make sure their blood is safe. This is blood bank blood. Transfusion therapy is the safest it has ever been in the practice of medicine. Before blood is taken from a donor, a complete history is taken to make sure that person has no history of diseases nor engages in risky social behavior (examples are intravenous drug use or sexual activity with multiple partners). The donor's travel history is screened to minimize risk of transmitting infections, such as malaria. The donated blood is tested for signs of infectious diseases, such as HIV and hepatitis. The blood is then tested to be sure it is compatible with you in order to minimize the chance of a transfusion reaction. If you or a relative donates blood, this is often done in anticipation of surgery and is not appropriate for emergency situations. It takes many days to process the donated blood. RISKS AND COMPLICATIONS Although transfusion therapy is very safe and saves many lives, the main dangers of transfusion include:   Getting an infectious disease.  Developing a transfusion reaction. This is an allergic reaction to something in the blood you were given. Every precaution is taken to prevent this. The decision to have a blood transfusion has been considered carefully by your caregiver before blood is given. Blood is not given unless the benefits outweigh the risks.

## 2017-09-10 NOTE — Progress Notes (Signed)
Consult Note: Gyn-Onc  Consult was requested by Dr. Phineas Real for the evaluation of Jody Duke 47 y.o. female  CC:  Chief Complaint  Patient presents with  . pelvic mass  . Elevated CA-125    Assessment/Plan:  Jody. Jody Duke  is a 47 y.o.  year old with bilateral complex cystic adnexal masses and very elevated CA125. She is obese (217lbs) and a heavy smoker.  I am recommending CT abdo/pelvis to better evaluate the upper abdomen and evaluate for evidence of metastatic disease.  I then recommend ex lap, TAH, BSO, possible staging, possible debulking.   I explained to the patient that I have concern that she may have a malignancy given the nodularity on exam, the elevated CA 125 and the complexity of her adnexal masses. We will know definitively after surgery. In order to expedite her surgery (given my concern for malignancy and her planned orthopedic procedure on April 17th), she was offered a surgical date with my partner, Dr Precious Haws, which she accepted. She will meet Dr Gerarda Fraction prior to surgery.  I discussed operative risks including  bleeding, infection, damage to internal organs (such as bladder,ureters, bowels), blood clot, reoperation and rehospitalization. I discussed that her obesity, diabetes and heavy tobacco use place her at increased risk for perioperative complications. She was informed to strictly control her blood glucose preoperatively (mindful of diet) and to cut back or quit smoking.    HPI: Jody Duke is a 47 year old P0 who is seen in consultation at the request of Dr Phineas Real for bilateral ovarian masses and elevated CA 125.  The patient developed abdominal fullness and intermittent pelvic and abdominal pain in February 2019.  Her primary care physician who manages her diabetes mellitus detected blood in her urine which is felt to be likely from the GYN tract.  She was seen by her OB/GYN in early March 2019 and a transvaginal ultrasound scan was performed  on September 04, 2017.  It reveals a uterus of normal size and echotexture.  The endometrium was 4.4 mm.  The right adnexa included a 15 cm cystic multiloculated mass with positive flow.  There was a solid area also noted measuring 5.4 x 5 cm.  The left adnexa include a multiloculated mass that measured 10 cm with positive flow.  The cul-de-sac was negative.  An endometrial biopsy was performed at that visit which revealed proliferative benign endometrium and endocervical mucosa.  No hyperplasia or atypia or malignancy was identified.  A Ca125 was drawn on September 04, 2017 and this was very elevated at 825.  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 Donne Hazel.  She was reoperated on in February 2019 with all benign fibro-adenomatoid changes.  The patient is a heavy smoker and intermittently smokes 1-1/2 packs/day.  She has type 2 diabetes mellitus for which she takes metformin.  She is nulliparous.  She has no documented history of treatment for infertility.  She is a remote history of oral contraceptive use for less than a decade.  She has had no prior abdominal surgeries.  The family history is significant for 2 paternal aunts with pancreatic cancer.  She has a maternal aunt who had a cancer of unknown etiology.  Her maternal grandmother had breast and lung cancer.  Current Meds:  Outpatient Encounter Medications as of 09/10/2017  Medication Sig  . atorvastatin (LIPITOR) 10 MG tablet Take 1 tablet (10 mg total) by mouth daily.  Marland Kitchen  glucose blood test strip Check sugar once daily  Dx: DMII controlled, non-insulin, without complications  . glucose monitoring kit (FREESTYLE) monitoring kit 1 each by Does not apply route as needed for other. Dx: DMII non-insulin dependent without complications; controlled;  . HYDROcodone-acetaminophen (NORCO) 10-325 MG tablet Take 1 tablet by mouth every 6 (six) hours as needed.  Marland Kitchen ibuprofen (ADVIL,MOTRIN) 800 MG tablet Take 800 mg by mouth  every 8 (eight) hours as needed for fever, headache, mild pain, moderate pain or cramping.  . Lancets MISC Check sugar once daily dx DMII controlled, non-insulin, without complications  . meloxicam (MOBIC) 15 MG tablet Take 15 mg by mouth daily.  . metFORMIN (GLUCOPHAGE) 500 MG tablet Take 1 tablet (500 mg total) by mouth daily.  . traMADol (ULTRAM) 50 MG tablet Take 1 tablet (50 mg total) by mouth every 8 (eight) hours as needed.   No facility-administered encounter medications on file as of 09/10/2017.     Allergy:  Allergies  Allergen Reactions  . Percocet [Oxycodone-Acetaminophen] Nausea And Vomiting    Social Hx:   Social History   Socioeconomic History  . Marital status: Single    Spouse name: Not on file  . Number of children: Not on file  . Years of education: Not on file  . Highest education level: Not on file  Social Needs  . Financial resource strain: Not on file  . Food insecurity - worry: Not on file  . Food insecurity - inability: Not on file  . Transportation needs - medical: Not on file  . Transportation needs - non-medical: Not on file  Occupational History  . Not on file  Tobacco Use  . Smoking status: Current Every Day Smoker    Packs/day: 1.00    Years: 30.00    Pack years: 30.00    Types: Cigarettes  . Smokeless tobacco: Never Used  Substance and Sexual Activity  . Alcohol use: No    Frequency: Never  . Drug use: No  . Sexual activity: No    Partners: Female    Comment: 1st intercourse 47 yo-More than 5 partners  Other Topics Concern  . Not on file  Social History Narrative   Marital status: single; girlfriend x 3 years; from [place]; moved [place] in 2015      Children: none      Lives: with girlfriend      Employment: works at [employer]; runs seasonal department      Tobacco: 1.5ppd x age 54.  Never quit.      Alcohol: None; rare      Drugs: week as teenager      Exercise: none      Sexual activity: females since 56; males prior.  One  STD/Gonorrhea.    Past Surgical Hx:  Past Surgical History:  Procedure Laterality Date  . BREAST BIOPSY Left 07/24/2017   Procedure: LEFT BREAST MASS EXCISIONAL BIOPSY;  Surgeon: Rolm Bookbinder, MD;  Location: [facility];  Service: General;  Laterality: Left;  . FOOT GANGLION EXCISION Left    age 20  . RE-EXCISION OF BREAST LUMPECTOMY Left 08/25/2017   Procedure: LEFT RE-EXCISION OF BREAST MARGINS ERAS PATHWAY;  Surgeon: Rolm Bookbinder, MD;  Location: [facility];  Service: General;  Laterality: Left;    Past Medical Hx:  Past Medical History:  Diagnosis Date  . Breast mass, left 07/2017  . Diabetes mellitus without complication ([place])   . Migraines     Past Gynecological History:  Patient's last menstrual period was 08/25/2017.  Family Hx:  Family History  Problem Relation Age of Onset  . Hyperlipidemia Mother   . Alcohol abuse Mother   . Cirrhosis Mother   . Diabetes Father   . Hyperlipidemia Father   . Hypertension Father   . Stroke Father   . Heart disease Father 7       CAD with stenting multiple  . Hyperlipidemia Brother   . Hypertension Brother   . Breast cancer Maternal Grandmother        Post menopausal    Review of Systems:  Constitutional  Feels well,    ENT Normal appearing ears and nares bilaterally Skin/Breast  No rash, sores, jaundice, itching, dryness Cardiovascular  No chest pain, shortness of breath, or edema  Pulmonary  No cough or wheeze.  Gastro Intestinal  No nausea, vomitting, or diarrhoea. No bright red blood per rectum, + abdominal pain, no change in bowel movement, or constipation.  Genito Urinary  No frequency, urgency, dysuria, + intermittent spotting Musculo Skeletal  + left foot pain Neurologic  No weakness, numbness, change in gait,  Psychology  No depression, anxiety, insomnia.   Vitals:  Blood pressure 130/82, pulse 94, temperature 98.4 F (36.9 C), temperature source Oral, resp.  rate 20, weight 217 lb (98.4 kg), last menstrual period 08/25/2017, SpO2 100 %.  Physical Exam: WD in NAD Neck  Supple NROM, without any enlargements.  Lymph Node Survey No cervical supraclavicular or inguinal adenopathy Cardiovascular  Pulse normal rate, regularity and rhythm. S1 and S2 normal.  Lungs  Clear to auscultation bilateraly, without wheezes/crackles/rhonchi. Good air movement.  Skin  No rash/lesions/breakdown  Psychiatry  Alert and oriented to person, place, and time  Abdomen  Normoactive bowel sounds, abdomen soft, non-tender and obese without evidence of hernia. Can appreciate mass extending from pelvis to mid abdomen - smooth, somewhat mobile. Back No CVA tenderness Genito Urinary  Vulva/vagina: Normal external female genitalia.   No lesions. No discharge or bleeding.  Bladder/urethra:  No lesions or masses, well supported bladder  Vagina: normal  Cervix: Normal appearing, no lesions.  Uterus:  Small, mobile,deviated anteriorally by posterior adnexal mass no parametrial involvement or nodularity.  Adnexa: posterior large adnexal masses with nodular cystic mass palpable on posterior upper vagina. Rectal  Good tone, no masses no cul de sac nodularity. Unable to appreciated tumor infiltration of rectal wall Extremities  No bilateral cyanosis, clubbing or edema.   Jody Solo, MD  09/10/2017, 10:40 AM

## 2017-09-12 ENCOUNTER — Encounter: Payer: Self-pay | Admitting: Gynecology

## 2017-09-16 ENCOUNTER — Telehealth: Payer: Self-pay | Admitting: *Deleted

## 2017-09-16 ENCOUNTER — Other Ambulatory Visit: Payer: Self-pay | Admitting: Gynecologic Oncology

## 2017-09-16 ENCOUNTER — Encounter (INDEPENDENT_AMBULATORY_CARE_PROVIDER_SITE_OTHER): Payer: Self-pay

## 2017-09-16 DIAGNOSIS — N83202 Unspecified ovarian cyst, left side: Principal | ICD-10-CM

## 2017-09-16 DIAGNOSIS — N83201 Unspecified ovarian cyst, right side: Secondary | ICD-10-CM

## 2017-09-16 NOTE — Telephone Encounter (Signed)
Returned the patient's call and gave the information for the CT scan. Advised her to pick up the contrast at [facility].

## 2017-09-16 NOTE — Telephone Encounter (Signed)
Called and left the patient a message to call the office back regarding her scan on Thursday. Patient needs to pick up contrast.

## 2017-09-16 NOTE — Progress Notes (Signed)
Order for CT pelvis to be removed and new order for CT AP with contrast to be performed on Thurs.  Spoke with Anderson Malta with Dr. Zelphia Cairo office.

## 2017-09-18 ENCOUNTER — Ambulatory Visit
Admission: RE | Admit: 2017-09-18 | Discharge: 2017-09-18 | Disposition: A | Discharge status: Home / Self Care | Payer: 59 | Source: Ambulatory Visit | Attending: Gynecology | Admitting: Gynecology

## 2017-09-18 DIAGNOSIS — N200 Calculus of kidney: Secondary | ICD-10-CM | POA: Diagnosis not present

## 2017-09-18 DIAGNOSIS — N83202 Unspecified ovarian cyst, left side: Principal | ICD-10-CM

## 2017-09-18 DIAGNOSIS — N83201 Unspecified ovarian cyst, right side: Secondary | ICD-10-CM

## 2017-09-18 MED ORDER — IOPAMIDOL (ISOVUE-300) INJECTION 61%
100.0000 mL | Freq: Once | INTRAVENOUS | Status: AC | PRN
  Administered 2017-09-18: 100 mL via INTRAVENOUS

## 2017-09-19 ENCOUNTER — Ambulatory Visit: Payer: 59 | Admitting: Gynecologic Oncology

## 2017-09-19 ENCOUNTER — Encounter: Payer: Self-pay | Admitting: Obstetrics

## 2017-09-22 ENCOUNTER — Telehealth: Payer: Self-pay

## 2017-09-22 NOTE — Telephone Encounter (Signed)
LM for patient to call back to discuss results of CT scan from 09-19-17.

## 2017-09-22 NOTE — Telephone Encounter (Signed)
Told Jody Duke that the CT scan shows the ovarian masses that are known.  No signs of metastatic disease per [name],NP.

## 2017-09-25 NOTE — Progress Notes (Signed)
08-21-17 (Epic) EKG

## 2017-09-25 NOTE — Patient Instructions (Addendum)
Jody Duke  09/25/2017   Your procedure is scheduled on: 10-02-17   Report to [facility] AM. Have a seat in the Main Lobby. Please note there is a phone at the [employer]. Please call [phone] on that phone. Someone from Short Stay will come and get you from the Main Lobby and take you to Short Stay.   Call this number if you have problems the morning of surgery [phone]   Remember: Do not eat food or drink liquids :After Midnight.   Eat a light diet the day before surgery.  Examples including soups, broths, toast, yogurt, mashed potatoes.  Things to avoid include carbonated beverages (fizzy beverages), raw fruits and raw vegetables, or beans.   If your bowels are filled with gas, your surgeon will have difficulty visualizing your pelvic organs which increases your surgical risks.   Take these medicines the morning of surgery with A SIP OF WATER: None DO NOT TAKE ANY DIABETIC MEDICATIONS DAY OF YOUR SURGERY                               You may not have any metal on your body including hair pins and              piercings  Do not wear jewelry, make-up, lotions, powders or perfumes, deodorant             Do not wear nail polish.  Do not shave  48 hours prior to surgery.               Do not bring valuables to the hospital. [place].  Contacts, dentures or bridgework may not be worn into surgery.  Leave suitcase in the car. After surgery it may be brought to your room.                  Please read over the following fact sheets you were given: _____________________________________________________________________  How to Manage Your Diabetes Before and After Surgery  Why is it important to control my blood sugar before and after surgery? . Improving blood sugar levels before and after surgery helps healing and can limit problems. . A way of improving  blood sugar control is eating a healthy diet by: o  Eating less sugar and carbohydrates o  Increasing activity/exercise o  Talking with your doctor about reaching your blood sugar goals . High blood sugars (greater than 180 mg/dL) can raise your risk of infections and slow your recovery, so you will need to focus on controlling your diabetes during the weeks before surgery. . Make sure that the doctor who takes care of your diabetes knows about your planned surgery including the date and location.  How do I manage my blood sugar before surgery? . Check your blood sugar at least 4 times a day, starting 2 days before surgery, to make sure that the level is not too high or low. o Check your blood sugar the morning of your surgery when you wake up and every 2 hours until you get to the Short Stay unit. . If your blood sugar is less than 70 mg/dL, you will need to treat for low blood  sugar: o Do not take insulin. o Treat a low blood sugar (less than 70 mg/dL) with  cup of clear juice (cranberry or apple), 4 glucose tablets, OR glucose gel. o Recheck blood sugar in 15 minutes after treatment (to make sure it is greater than 70 mg/dL). If your blood sugar is not greater than 70 mg/dL on recheck, call [phone] for further instructions. . Report your blood sugar to the short stay nurse when you get to Short Stay.  . If you are admitted to the hospital after surgery: o Your blood sugar will be checked by the staff and you will probably be given insulin after surgery (instead of oral diabetes medicines) to make sure you have good blood sugar levels. o The goal for blood sugar control after surgery is 80-180 mg/dL.   WHAT DO I DO ABOUT MY DIABETES MEDICATION?  Marland Kitchen Do not take oral diabetes medicines (pills) the morning of surgery.  . THE DAY BEFORE SURGERY, take your usual dose of Metformin        Reviewed and Endorsed by [facility] Patient Education Committee, August 2015           [facility] - Preparing for Surgery Before surgery, you can play an important role.  Because skin is not sterile, your skin needs to be as free of germs as possible.  You can reduce the number of germs on your skin by washing with CHG (chlorahexidine gluconate) soap before surgery.  CHG is an antiseptic cleaner which kills germs and bonds with the skin to continue killing germs even after washing. Please DO NOT use if you have an allergy to CHG or antibacterial soaps.  If your skin becomes reddened/irritated stop using the CHG and inform your nurse when you arrive at Short Stay. Do not shave (including legs and underarms) for at least 48 hours prior to the first CHG shower.  You may shave your face/neck. Please follow these instructions carefully:  1.  Shower with CHG Soap the night before surgery and the  morning of Surgery.  2.  If you choose to wash your hair, wash your hair first as usual with your  normal  shampoo.  3.  After you shampoo, rinse your hair and body thoroughly to remove the  shampoo.                           4.  Use CHG as you would any other liquid soap.  You can apply chg directly  to the skin and wash                       Gently with a scrungie or clean washcloth.  5.  Apply the CHG Soap to your body ONLY FROM THE NECK DOWN.   Do not use on face/ open                           Wound or open sores. Avoid contact with eyes, ears mouth and genitals (private parts).                       Wash face,  Genitals (private parts) with your normal soap.             6.  Wash thoroughly, paying special attention to the area where your surgery  will be performed.  7.  Thoroughly rinse  your body with warm water from the neck down.  8.  DO NOT shower/wash with your normal soap after using and rinsing off  the CHG Soap.                9.  Pat yourself dry with a clean towel.            10.  Wear clean pajamas.            11.  Place clean sheets on your bed the night of your first shower and do not   sleep with pets. Day of Surgery : Do not apply any lotions/deodorants the morning of surgery.  Please wear clean clothes to the hospital/surgery center.  FAILURE TO FOLLOW THESE INSTRUCTIONS MAY RESULT IN THE CANCELLATION OF YOUR SURGERY PATIENT SIGNATURE_________________________________  NURSE SIGNATURE__________________________________  ________________________________________________________________________   Adam Phenix  An incentive spirometer is a tool that can help keep your lungs clear and active. This tool measures how well you are filling your lungs with each breath. Taking long deep breaths may help reverse or decrease the chance of developing breathing (pulmonary) problems (especially infection) following:  A long period of time when you are unable to move or be active. BEFORE THE PROCEDURE   If the spirometer includes an indicator to show your best effort, your nurse or respiratory therapist will set it to a desired goal.  If possible, sit up straight or lean slightly forward. Try not to slouch.  Hold the incentive spirometer in an upright position. INSTRUCTIONS FOR USE  1. Sit on the edge of your bed if possible, or sit up as far as you can in bed or on a chair. 2. Hold the incentive spirometer in an upright position. 3. Breathe out normally. 4. Place the mouthpiece in your mouth and seal your lips tightly around it. 5. Breathe in slowly and as deeply as possible, raising the piston or the ball toward the top of the column. 6. Hold your breath for 3-5 seconds or for as long as possible. Allow the piston or ball to fall to the bottom of the column. 7. Remove the mouthpiece from your mouth and breathe out normally. 8. Rest for a few seconds and repeat Steps 1 through 7 at least 10 times every 1-2 hours when you are awake. Take your time and take a few normal breaths between deep breaths. 9. The spirometer may include an indicator to show your best effort. Use  the indicator as a goal to work toward during each repetition. 10. After each set of 10 deep breaths, practice coughing to be sure your lungs are clear. If you have an incision (the cut made at the time of surgery), support your incision when coughing by placing a pillow or rolled up towels firmly against it. Once you are able to get out of bed, walk around indoors and cough well. You may stop using the incentive spirometer when instructed by your caregiver.  RISKS AND COMPLICATIONS  Take your time so you do not get dizzy or light-headed.  If you are in pain, you may need to take or ask for pain medication before doing incentive spirometry. It is harder to take a deep breath if you are having pain. AFTER USE  Rest and breathe slowly and easily.  It can be helpful to keep track of a log of your progress. Your caregiver can provide you with a simple table to help with this. If you are using the spirometer at  home, follow these instructions: [place] IF:   You are having difficultly using the spirometer.  You have trouble using the spirometer as often as instructed.  Your pain medication is not giving enough relief while using the spirometer.  You develop fever of 100.5 F (38.1 C) or higher. SEEK IMMEDIATE MEDICAL CARE IF:   You cough up bloody sputum that had not been present before.  You develop fever of 102 F (38.9 C) or greater.  You develop worsening pain at or near the incision site. MAKE SURE YOU:   Understand these instructions.  Will watch your condition.  Will get help right away if you are not doing well or get worse. Document Released: 10/28/2006 Document Revised: 09/09/2011 Document Reviewed: 12/29/2006 ExitCare Patient Information 2014 ExitCare, [place].   ________________________________________________________________________  WHAT IS A BLOOD TRANSFUSION? Blood Transfusion Information  A transfusion is the replacement of blood or some of its parts.  Blood is made up of multiple cells which provide different functions.  Red blood cells carry oxygen and are used for blood loss replacement.  White blood cells fight against infection.  Platelets control bleeding.  Plasma helps clot blood.  Other blood products are available for specialized needs, such as hemophilia or other clotting disorders. BEFORE THE TRANSFUSION  Who gives blood for transfusions?   Healthy volunteers who are fully evaluated to make sure their blood is safe. This is blood bank blood. Transfusion therapy is the safest it has ever been in the practice of medicine. Before blood is taken from a donor, a complete history is taken to make sure that person has no history of diseases nor engages in risky social behavior (examples are intravenous drug use or sexual activity with multiple partners). The donor's travel history is screened to minimize risk of transmitting infections, such as malaria. The donated blood is tested for signs of infectious diseases, such as HIV and hepatitis. The blood is then tested to be sure it is compatible with you in order to minimize the chance of a transfusion reaction. If you or a relative donates blood, this is often done in anticipation of surgery and is not appropriate for emergency situations. It takes many days to process the donated blood. RISKS AND COMPLICATIONS Although transfusion therapy is very safe and saves many lives, the main dangers of transfusion include:   Getting an infectious disease.  Developing a transfusion reaction. This is an allergic reaction to something in the blood you were given. Every precaution is taken to prevent this. The decision to have a blood transfusion has been considered carefully by your caregiver before blood is given. Blood is not given unless the benefits outweigh the risks. AFTER THE TRANSFUSION  Right after receiving a blood transfusion, you will usually feel much better and more energetic. This is  especially true if your red blood cells have gotten low (anemic). The transfusion raises the level of the red blood cells which carry oxygen, and this usually causes an energy increase.  The nurse administering the transfusion will monitor you carefully for complications. HOME CARE INSTRUCTIONS  No special instructions are needed after a transfusion. You may find your energy is better. Speak with your caregiver about any limitations on activity for underlying diseases you may have. SEEK MEDICAL CARE IF:   Your condition is not improving after your transfusion.  You develop redness or irritation at the intravenous (IV) site. SEEK IMMEDIATE MEDICAL CARE IF:  Any of the following symptoms occur over the next  12 hours:  Shaking chills.  You have a temperature by mouth above 102 F (38.9 C), not controlled by medicine.  Chest, back, or muscle pain.  People around you feel you are not acting correctly or are confused.  Shortness of breath or difficulty breathing.  Dizziness and fainting.  You get a rash or develop hives.  You have a decrease in urine output.  Your urine turns a dark color or changes to pink, red, or brown. Any of the following symptoms occur over the next 10 days:  You have a temperature by mouth above 102 F (38.9 C), not controlled by medicine.  Shortness of breath.  Weakness after normal activity.  The white part of the eye turns yellow (jaundice).  You have a decrease in the amount of urine or are urinating less often.  Your urine turns a dark color or changes to pink, red, or brown. Document Released: 06/14/2000 Document Revised: 09/09/2011 Document Reviewed: 02/01/2008 [facility] Patient Information 2014 [place], [place].  _______________________________________________________________________

## 2017-09-26 ENCOUNTER — Encounter (HOSPITAL_COMMUNITY): Payer: Self-pay

## 2017-09-26 ENCOUNTER — Encounter: Payer: Self-pay | Admitting: Obstetrics

## 2017-09-26 ENCOUNTER — Encounter (HOSPITAL_COMMUNITY)
Admission: RE | Admit: 2017-09-26 | Discharge: 2017-09-26 | Disposition: A | Discharge status: Home / Self Care | Payer: 59 | Source: Ambulatory Visit | Attending: Obstetrics | Admitting: Obstetrics

## 2017-09-26 ENCOUNTER — Other Ambulatory Visit: Payer: Self-pay

## 2017-09-26 ENCOUNTER — Ambulatory Visit (HOSPITAL_COMMUNITY)
Admission: RE | Admit: 2017-09-26 | Discharge: 2017-09-26 | Disposition: A | Discharge status: Home / Self Care | Payer: 59 | Source: Ambulatory Visit | Attending: Gynecologic Oncology | Admitting: Gynecologic Oncology

## 2017-09-26 DIAGNOSIS — E119 Type 2 diabetes mellitus without complications: Secondary | ICD-10-CM | POA: Diagnosis not present

## 2017-09-26 DIAGNOSIS — Z01818 Encounter for other preprocedural examination: Secondary | ICD-10-CM | POA: Diagnosis not present

## 2017-09-26 DIAGNOSIS — R918 Other nonspecific abnormal finding of lung field: Secondary | ICD-10-CM | POA: Insufficient documentation

## 2017-09-26 DIAGNOSIS — N839 Noninflammatory disorder of ovary, fallopian tube and broad ligament, unspecified: Secondary | ICD-10-CM | POA: Diagnosis not present

## 2017-09-26 DIAGNOSIS — Z01812 Encounter for preprocedural laboratory examination: Secondary | ICD-10-CM | POA: Diagnosis present

## 2017-09-26 DIAGNOSIS — R971 Elevated cancer antigen 125 [CA 125]: Secondary | ICD-10-CM

## 2017-09-26 LAB — COMPREHENSIVE METABOLIC PANEL
ALT: 28 U/L (ref 14–54)
AST: 20 U/L (ref 15–41)
Albumin: 3.6 g/dL (ref 3.5–5.0)
Alkaline Phosphatase: 74 U/L (ref 38–126)
Anion gap: 11 (ref 5–15)
BUN: 14 mg/dL (ref 6–20)
CO2: 23 mmol/L (ref 22–32)
Calcium: 9.1 mg/dL (ref 8.9–10.3)
Chloride: 107 mmol/L (ref 101–111)
Creatinine, Ser: 0.55 mg/dL (ref 0.44–1.00)
GFR calc Af Amer: >60 mL/min (ref >60)
GFR calc non Af Amer: >60 mL/min (ref >60)
Glucose, Bld: 190 mg/dL — ABNORMAL HIGH (ref 65–99)
Potassium: 4 mmol/L (ref 3.5–5.1)
Sodium: 141 mmol/L (ref 135–145)
Total Bilirubin: 0.5 mg/dL (ref 0.3–1.2)
Total Protein: 6.6 g/dL (ref 6.5–8.1)

## 2017-09-26 LAB — ABO/RH: ABO/RH(D): O POS

## 2017-09-26 LAB — GLUCOSE, CAPILLARY: Glucose-Capillary: 153 mg/dL — ABNORMAL HIGH (ref 65–99)

## 2017-09-26 LAB — URINALYSIS, ROUTINE W REFLEX MICROSCOPIC
Bilirubin Urine: NEGATIVE
Glucose, UA: NEGATIVE mg/dL
Ketones, ur: NEGATIVE mg/dL
Leukocytes, UA: NEGATIVE
Nitrite: NEGATIVE
Protein, ur: 100 mg/dL — AB
Specific Gravity, Urine: 1.021 (ref 1.005–1.030)
pH: 6 (ref 5.0–8.0)

## 2017-09-26 LAB — CBC
HCT: 38.7 % (ref 36.0–46.0)
Hemoglobin: 12.9 g/dL (ref 12.0–15.0)
MCH: 31.4 pg (ref 26.0–34.0)
MCHC: 33.3 g/dL (ref 30.0–36.0)
MCV: 94.2 fL (ref 78.0–100.0)
Platelets: 305 10*3/uL (ref 150–400)
RBC: 4.11 MIL/uL (ref 3.87–5.11)
RDW: 12.4 % (ref 11.5–15.5)
WBC: 8.6 10*3/uL (ref 4.0–10.5)

## 2017-09-26 LAB — HEMOGLOBIN A1C
Hgb A1c MFr Bld: 6.6 % — ABNORMAL HIGH (ref 4.8–5.6)
Mean Plasma Glucose: 142.72 mg/dL

## 2017-09-26 LAB — PREGNANCY, URINE: Preg Test, Ur: NEGATIVE

## 2017-09-26 NOTE — Progress Notes (Addendum)
09-26-17 UA and CXR results routed to Dr. Gerarda Fraction for review

## 2017-09-29 ENCOUNTER — Encounter: Payer: Self-pay | Admitting: Obstetrics

## 2017-09-29 ENCOUNTER — Inpatient Hospital Stay: Payer: 59 | Attending: Gynecologic Oncology | Admitting: Obstetrics

## 2017-09-29 VITALS — BP 154/84 | HR 85 | Temp 98.3°F | Resp 20 | Wt 217.1 lb

## 2017-09-29 DIAGNOSIS — N83202 Unspecified ovarian cyst, left side: Secondary | ICD-10-CM | POA: Insufficient documentation

## 2017-09-29 DIAGNOSIS — R971 Elevated cancer antigen 125 [CA 125]: Secondary | ICD-10-CM | POA: Diagnosis not present

## 2017-09-29 DIAGNOSIS — F17210 Nicotine dependence, cigarettes, uncomplicated: Secondary | ICD-10-CM

## 2017-09-29 DIAGNOSIS — R197 Diarrhea, unspecified: Secondary | ICD-10-CM | POA: Insufficient documentation

## 2017-09-29 DIAGNOSIS — Z90710 Acquired absence of both cervix and uterus: Secondary | ICD-10-CM | POA: Diagnosis not present

## 2017-09-29 DIAGNOSIS — E119 Type 2 diabetes mellitus without complications: Secondary | ICD-10-CM

## 2017-09-29 DIAGNOSIS — N83201 Unspecified ovarian cyst, right side: Secondary | ICD-10-CM | POA: Diagnosis present

## 2017-09-29 DIAGNOSIS — Z6834 Body mass index (BMI) 34.0-34.9, adult: Secondary | ICD-10-CM | POA: Diagnosis not present

## 2017-09-29 DIAGNOSIS — E669 Obesity, unspecified: Secondary | ICD-10-CM

## 2017-09-29 DIAGNOSIS — Z90722 Acquired absence of ovaries, bilateral: Secondary | ICD-10-CM | POA: Insufficient documentation

## 2017-09-29 MED ORDER — HYDROCODONE-ACETAMINOPHEN 5-325 MG PO TABS: 1.0000 | ORAL_TABLET | ORAL | 0 refills | Status: DC | PRN

## 2017-09-29 NOTE — Progress Notes (Signed)
Consult Note: Gyn-Onc  Consult was requested by Dr. Phineas Real for the evaluation of Jody Duke 47 y.o. female  CC:  Chief Complaint  Patient presents with  . Bilateral ovarian cysts  . Elevated CA-125    Assessment/Plan:  Jody Duke  is a 47 y.o.  year old with bilateral complex cystic adnexal masses and very elevated CA125. She is obese (217lbs) and a heavy smoker.  I agree with Dr. Denman George her findings are concerning for malignancy ; nodularity on exam, elevated CA 125 and the complexity of her adnexal masses. We will know definitively after surgery. Should this not be a malignant process she favors BSO regardless. She understands the risk of bowel injury/resection given the posterior location noted on Dr. Serita Grit exam. We also discussed the risks with staging and specifically with LND of LE edema, neuropathies, and lymphocysts.  We plan to proceed as scheduled this week for surgery. I asked her to stop taking her NSAID's.  HPI: Jody Duke is a 47 year old P0 who is seen in consultation at the request of Dr Phineas Real for bilateral ovarian masses and elevated CA 125.    Interval History: Since her last visit with my partner, Dr. Denman George, she has had CTScan. There is no evidence of metastatic disease. There is confirmation of "bilateral" adnexal masses. Stating they are inseparable with one estimated to be 14cm, the other 10cm.  It was recommend last visit that we proceed with ex lap, TAH, BSO, possible staging, possible debulking. She presents to review the CT (See below) and for further surgical counseling.  Dr. Denman George discussed the operative risks last visit, which we re-reviewed today;  Previously it was explained there are risks such as bleeding, infection, damage to internal organs (such as bladder,ureters, bowels), blood clot, reoperation and rehospitalization. She was told Her obesity, diabetes and heavy tobacco use place her at increased risk for perioperative  complications. She was informed to strictly control her blood glucose preoperatively (mindful of diet).  States she has not taken Mobic for >2 weeks. Has taken PRN NSAID.  Pap is noted on chart from 05/2017 negative for malignancy with negative HPV.  She does have a history of dysmenorrhea, and likely infertility (states she went without contraception and didn't ever get pregnant despite intercourse) but has never been given the diagnosis of endometriosis.   Presenting History The patient developed abdominal fullness and intermittent pelvic and abdominal pain in February 2019.  Her primary care physician who manages her diabetes mellitus detected blood in her urine which is felt to be likely from the GYN tract.  She was seen by her OB/GYN in early March 2019 and a transvaginal ultrasound scan was performed on September 04, 2017.  It reveals a uterus of normal size and echotexture.  The endometrium was 4.4 mm.  The right adnexa included a 15 cm cystic multiloculated mass with positive flow.  There was a solid area also noted measuring 5.4 x 5 cm.  The left adnexa include a multiloculated mass that measured 10 cm with positive flow.  The cul-de-sac was negative.  An endometrial biopsy was performed at that visit which revealed proliferative benign endometrium and endocervical mucosa.  No hyperplasia or atypia or malignancy was identified.  A Ca125 was drawn on September 04, 2017 and this was very elevated at 825.  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 Donne Hazel.  She was reoperated on in February 2019 with all  benign fibro-adenomatoid changes.  The patient is a heavy smoker and intermittently smokes 1-1/2 packs/day.  She has type 2 diabetes mellitus for which she takes metformin.  She is nulliparous.  She has no documented history of treatment for infertility.  She is a remote history of oral contraceptive use for less than a decade.  She has had no prior abdominal  surgeries.  The family history is significant for 2 paternal aunts with pancreatic cancer.  She has a maternal aunt who had a cancer of unknown etiology.  Her maternal grandmother had breast and lung cancer.   09/18/17 - CT ABDOMEN AND PELVIS WITH CONTRAST TECHNIQUE: Multidetector CT imaging of the abdomen and pelvis was performed using the standard protocol following bolus administration of intravenous contrast. CONTRAST:  146m ISOVUE-300 IOPAMIDOL (ISOVUE-300) INJECTION 61% COMPARISON:  Pelvic sonography dated 09/04/2017 FINDINGS: Lower chest: Unremarkable  Hepatobiliary: Diffuse hepatic steatosis with some fatty sparing along the gallbladder fossa. Gallbladder unremarkable. No biliary dilatation.  Pancreas: Unremarkable  Spleen: Unremarkable  Adrenals/Urinary Tract: Adrenal glands normal.  2 mm right mid kidney nonobstructive renal calculus.  Stomach/Bowel: Unremarkable  Vascular/Lymphatic: Aortoiliac atherosclerotic vascular disease. 9 mm right pelvic sidewall lymph node on image 72/2, upper normal size. No overtly pathologic adenopathy noted.  Reproductive: Bilateral complex multi cystic adnexal masses. Inseparable from the right ovary is a 14.3 by 13.8 by 13.5 cm septated multi cystic complex mass with some linear calcifications and possible solid components. This is tangential to a left adnexal mass measuring 9.7 by 6.5 by 4.9 cm. Normal ovarian tissue is not well seen against the backdrop of these masses.  The uterus itself appears unremarkable.  Other: No current ascites. No nodularity along the omentum or peritoneal surfaces currently.  Musculoskeletal: Unremarkable  IMPRESSION: 1. Bilateral large adnexal masses inseparable from the ovaries. Appearance favors epithelial ovarian tumors. No current findings of metastatic spread. Typically in this scenario, resection is warranted. 2. Diffuse hepatic steatosis. 3.  Aortic Atherosclerosis  (ICD10-I70.0). 4. 2 mm right mid kidney nonobstructive renal calculus.   Electronically Signed   By: WVan ClinesM.D.   On: 09/19/2017 11:51   Current Meds:  Outpatient Encounter Medications as of 09/29/2017  Medication Sig  . amoxicillin (AMOXIL) 500 MG capsule Take 500 mg by mouth 4 (four) times daily.  .Marland Kitchenatorvastatin (LIPITOR) 10 MG tablet Take 1 tablet (10 mg total) by mouth daily. (Patient taking differently: Take 10 mg by mouth daily after supper. )  . glucose blood test strip Check sugar once daily  Dx: DMII controlled, non-insulin, without complications  . glucose monitoring kit (FREESTYLE) monitoring kit 1 each by Does not apply route as needed for other. Dx: DMII non-insulin dependent without complications; controlled;  . ibuprofen (ADVIL,MOTRIN) 800 MG tablet Take 800 mg by mouth every 8 (eight) hours as needed for fever, headache, mild pain, moderate pain or cramping.  . Lancets MISC Check sugar once daily dx DMII controlled, non-insulin, without complications  . metFORMIN (GLUCOPHAGE) 500 MG tablet Take 1 tablet (500 mg total) by mouth daily. (Patient taking differently: Take 500 mg by mouth daily after supper. )  . HYDROcodone-acetaminophen (NORCO/VICODIN) 5-325 MG tablet Take 1 tablet by mouth every 4 (four) hours as needed for moderate pain (as needed postop pain. Use pills from dentist before using this.).  .Marland Kitchenmeloxicam (MOBIC) 15 MG tablet Take 15 mg by mouth daily as needed (for pain/inflammation.).   .Marland KitchentraMADol (ULTRAM) 50 MG tablet Take 1 tablet (50 mg total) by mouth every  8 (eight) hours as needed. (Patient not taking: Reported on 09/29/2017)  . [DISCONTINUED] HYDROcodone-acetaminophen (NORCO) 10-325 MG tablet Take 1 tablet by mouth every 6 (six) hours as needed. (Patient taking differently: Take 1 tablet by mouth every 6 (six) hours as needed (for pain.). )  . [DISCONTINUED] HYDROcodone-acetaminophen (NORCO/VICODIN) 5-325 MG tablet TAKE 1 TABLET EVERY 6 HOURS AS  NEEDED DENTAL PAIN  . [DISCONTINUED] HYDROcodone-acetaminophen (NORCO/VICODIN) 5-325 MG tablet Take 1 tablet by mouth every 4 (four) hours as needed for moderate pain (as needed for postoperative pain. Use your prescription from the dental provider before using this.).   No facility-administered encounter medications on file as of 09/29/2017.     Allergy:  Allergies  Allergen Reactions  . Percocet [Oxycodone-Acetaminophen] Nausea And Vomiting    Social Hx:   Social History   Socioeconomic History  . Marital status: Single    Spouse name: Not on file  . Number of children: Not on file  . Years of education: Not on file  . Highest education level: Not on file  Occupational History  . Not on file  Social Needs  . Financial resource strain: Not on file  . Food insecurity:    Worry: Not on file    Inability: Not on file  . Transportation needs:    Medical: Not on file    Non-medical: Not on file  Tobacco Use  . Smoking status: Current Every Day Smoker    Packs/day: 0.00    Years: 30.00    Pack years: 0.00    Types: Cigarettes  . Smokeless tobacco: Never Used  . Tobacco comment: 1 daily  Substance and Sexual Activity  . Alcohol use: No    Frequency: Never  . Drug use: No  . Sexual activity: Not Currently    Partners: Female    Comment: 1st intercourse 47 yo-More than 5 partners  Lifestyle  . Physical activity:    Days per week: Not on file    Minutes per session: Not on file  . Stress: Not on file  Relationships  . Social connections:    Talks on phone: Not on file    Gets together: Not on file    Attends religious service: Not on file    Active member of club or organization: Not on file    Attends meetings of clubs or organizations: Not on file    Relationship status: Not on file  . Intimate partner violence:    Fear of current or ex partner: Not on file    Emotionally abused: Not on file    Physically abused: Not on file    Forced sexual activity: Not on file   Other Topics Concern  . Not on file  Social History Narrative   Marital status: single; girlfriend x 3 years; from [place]; moved [place] in 2015      Children: none      Lives: with girlfriend      Employment: works at [employer]; runs seasonal department      Tobacco: 1.5ppd x age 45.  Never quit.      Alcohol: None; rare      Drugs: week as teenager      Exercise: none      Sexual activity: females since 67; males prior.  One STD/Gonorrhea.    Past Surgical Hx:  Past Surgical History:  Procedure Laterality Date  . BREAST BIOPSY Left 07/24/2017   Procedure: LEFT BREAST MASS EXCISIONAL BIOPSY;  Surgeon: Rolm Bookbinder, MD;  Location: [facility];  Service: General;  Laterality: Left;  . FOOT GANGLION EXCISION Left    age 72  . RE-EXCISION OF BREAST LUMPECTOMY Left 08/25/2017   Procedure: LEFT RE-EXCISION OF BREAST MARGINS ERAS PATHWAY;  Surgeon: Rolm Bookbinder, MD;  Location: [facility];  Service: General;  Laterality: Left;    Past Medical Hx:  Past Medical History:  Diagnosis Date  . Breast mass, left 07/2017  . Diabetes mellitus without complication ([place])   . Migraines     Past Gynecological History:   Patient's last menstrual period was 09/21/2017 (exact date).  Family Hx:  Family History  Problem Relation Age of Onset  . Hyperlipidemia Mother   . Alcohol abuse Mother   . Cirrhosis Mother   . Diabetes Father   . Hyperlipidemia Father   . Hypertension Father   . Stroke Father   . Heart disease Father 47       CAD with stenting multiple  . Hyperlipidemia Brother   . Hypertension Brother   . Breast cancer Maternal Grandmother        Post menopausal  . Pancreatic cancer Paternal Aunt   . Pancreatic cancer Paternal Aunt     Review of Systems:  Constitutional  Feels well,    ENT Normal appearing ears and nares bilaterally Skin/Breast  No rash, sores, jaundice, itching, dryness Cardiovascular  No chest pain, shortness of  breath, or edema  Pulmonary  No cough or wheeze.  Gastro Intestinal  No nausea, vomitting, or diarrhoea. No bright red blood per rectum, +intermittent abdominal pain, no change in bowel movement, or constipation.  Genito Urinary  No frequency, urgency, dysuria, + intermittent spotting with heavy bleeding x 2 years Musculo Skeletal  Negative for weakness Neurologic  No weakness, numbness, change in gait,  Psychology  No depression, anxiety, insomnia.   Vitals:  Blood pressure (!) 154/84, pulse 85, temperature 98.3 F (36.8 C), temperature source Oral, resp. rate 20, weight 217 lb 1.6 oz (98.5 kg), last menstrual period 09/21/2017, SpO2 100 %. Body mass index is 34 kg/m.  Physical Exam: ECOG PERFORMANCE STATUS: 1 - Symptomatic but completely ambulatory   General :  Well developed, 47 y.o., female in no apparent distress HEENT:  Normocephalic/atraumatic, symmetric, EOMI, eyelids normal Neck:   No visible masses.  Respiratory:  Respirations unlabored, no use of accessory muscles CV:   Deferred Breast:  Deferred Musculoskeletal: Normal muscle strength. Extremities:  No visible edema or deformities Skin:   Normal inspection Neuro/Psych:  No focal motor deficit, no abnormal mental status. Normal gait. Normal affect. Alert and oriented to person, place, and time  Pertinent positive findings from visit 09/10/17 as follows: Abdo- Can appreciate mass extending from pelvis to mid abdomen - smooth, somewhat mobile. Back - No CVA tenderness Genito Urinary  Vulva/vagina: Normal external female genitalia.   No lesions. No discharge or bleeding.  Bladder/urethra:  No lesions or masses, well supported bladder  Vagina: normal  Cervix: Normal appearing, no lesions.  Uterus:  Small, mobile,deviated anteriorally by posterior adnexal mass no parametrial involvement or nodularity.  Adnexa: posterior large adnexal masses with nodular cystic mass palpable on posterior upper vagina. Rectal  Good tone,  no masses no cul de sac nodularity. Unable to appreciated tumor infiltration of rectal wall   Isabel Caprice, MD  09/29/2017, 11:44 AM

## 2017-09-29 NOTE — Patient Instructions (Addendum)
1. Surgery planned Thursday 2. Stay off of antiinflammatory medications and Mobic 3. Discuss smoking with anesthesia.                Preparing for your Surgery  Plan for surgery on October 02, 2017 with Dr. Precious Haws at [place] [facility] will be scheduled for an exploratory laparotomy, bilateral salpingo-oophorectomy, possible staging, possible debulking, possible total abdominal hysterectomy.  Pre-operative Testing -You will receive a phone call from presurgical testing at [facility] to arrange for a pre-operative testing appointment before your surgery.  This appointment normally occurs one to two weeks before your scheduled surgery.   -Bring your insurance card, copy of an advanced directive if applicable, medication list  -At that visit, you will be asked to sign a consent for a possible blood transfusion in case a transfusion becomes necessary during surgery.  The need for a blood transfusion is rare but having consent is a necessary part of your care.     -You should not be taking blood thinners or aspirin at least ten days prior to surgery unless instructed by your surgeon.  Day Before Surgery at [place] will be asked to take in a clear liquid diet the day before surgery.  Avoid carbonated beverages.  YOU WILL NEED TO PERFORM A FLEETS ENEMA THE EVENING BEFORE SURGERY TO EMPTY ANY STOOL FROM YOUR COLON.  Your role in recovery Your role is to become active as soon as directed by your doctor, while still giving yourself time to heal.  Rest when you feel tired. You will be asked to do the following in order to speed your recovery:  - Cough and breathe deeply. This helps toclear and expand your lungs and can prevent pneumonia. You may be given a spirometer to practice deep breathing. A staff member will show you how to use the spirometer. - Do mild physical activity. Walking or moving your legs help your circulation and body functions return to normal. A staff member  will help you when you try to walk and will provide you with simple exercises. Do not try to get up or walk alone the first time. - Actively manage your pain. Managing your pain lets you move in comfort. We will ask you to rate your pain on a scale of zero to 10. It is your responsibility to tell your doctor or nurse where and how much you hurt so your pain can be treated.  Special Considerations -If you are diabetic, you may be placed on insulin after surgery to have closer control over your blood sugars to promote healing and recovery.  This does not mean that you will be discharged on insulin.  If applicable, your oral antidiabetics will be resumed when you are tolerating a solid diet.  -Your final pathology results from surgery should be available by the Friday after surgery and the results will be relayed to you when available.  -Dr. Lahoma Crocker is the Surgeon that assists your GYN Oncologist with surgery.  The next day after your surgery you will either see your GYN Oncologist or Dr. Lahoma Crocker.   Blood Transfusion Information WHAT IS A BLOOD TRANSFUSION? A transfusion is the replacement of blood or some of its parts. Blood is made up of multiple cells which provide different functions.  Red blood cells carry oxygen and are used for blood loss replacement.  White blood cells fight against infection.  Platelets control bleeding.  Plasma helps clot blood.  Other blood  products are available for specialized needs, such as hemophilia or other clotting disorders. BEFORE THE TRANSFUSION  Who gives blood for transfusions?   You may be able to donate blood to be used at a later date on yourself (autologous donation).  Relatives can be asked to donate blood. This is generally not any safer than if you have received blood from a stranger. The same precautions are taken to ensure safety when a relative's blood is donated.  Healthy volunteers who are fully evaluated to make  sure their blood is safe. This is blood bank blood. Transfusion therapy is the safest it has ever been in the practice of medicine. Before blood is taken from a donor, a complete history is taken to make sure that person has no history of diseases nor engages in risky social behavior (examples are intravenous drug use or sexual activity with multiple partners). The donor's travel history is screened to minimize risk of transmitting infections, such as malaria. The donated blood is tested for signs of infectious diseases, such as HIV and hepatitis. The blood is then tested to be sure it is compatible with you in order to minimize the chance of a transfusion reaction. If you or a relative donates blood, this is often done in anticipation of surgery and is not appropriate for emergency situations. It takes many days to process the donated blood. RISKS AND COMPLICATIONS Although transfusion therapy is very safe and saves many lives, the main dangers of transfusion include:   Getting an infectious disease.  Developing a transfusion reaction. This is an allergic reaction to something in the blood you were given. Every precaution is taken to prevent this. The decision to have a blood transfusion has been considered carefully by your caregiver before blood is given. Blood is not given unless the benefits outweigh the risks.

## 2017-09-29 NOTE — H&P (View-Only) (Signed)
Consult Note: Gyn-Onc  Consult was requested by Dr. Fontaine for the evaluation of Jody Duke 47 y.o. female  CC:  Chief Complaint  Patient presents with  . Bilateral ovarian cysts  . Elevated CA-125    Assessment/Plan:  [name]  is a 47 y.o.  year old with bilateral complex cystic adnexal masses and very elevated CA125. She is obese (217lbs) and a heavy smoker.  I agree with Dr. Rossi her findings are concerning for malignancy ; nodularity on exam, elevated CA 125 and the complexity of her adnexal masses. We will know definitively after surgery. Should this not be a malignant process she favors BSO regardless. She understands the risk of bowel injury/resection given the posterior location noted on Dr. Rossi's exam. We also discussed the risks with staging and specifically with LND of LE edema, neuropathies, and lymphocysts.  We plan to proceed as scheduled this week for surgery. I asked her to stop taking her NSAID's.  HPI: Ms Jody Duke is a 47 year old P0 who is seen in consultation at the request of Dr Fontaine for bilateral ovarian masses and elevated CA 125.    Interval History: Since her last visit with my partner, Dr. Rossi, she has had CTScan. There is no evidence of metastatic disease. There is confirmation of "bilateral" adnexal masses. Stating they are inseparable with one estimated to be 14cm, the other 10cm.  It was recommend last visit that we proceed with ex lap, TAH, BSO, possible staging, possible debulking. She presents to review the CT (See below) and for further surgical counseling.  Dr. Rossi discussed the operative risks last visit, which we re-reviewed today;  Previously it was explained there are risks such as bleeding, infection, damage to internal organs (such as bladder,ureters, bowels), blood clot, reoperation and rehospitalization. She was told Her obesity, diabetes and heavy tobacco use place her at increased risk for perioperative  complications. She was informed to strictly control her blood glucose preoperatively (mindful of diet).  States she has not taken Mobic for >2 weeks. Has taken PRN NSAID.  Pap is noted on chart from 05/2017 negative for malignancy with negative HPV.  She does have a history of dysmenorrhea, and likely infertility (states she went without contraception and didn't ever get pregnant despite intercourse) but has never been given the diagnosis of endometriosis.   Presenting History The patient developed abdominal fullness and intermittent pelvic and abdominal pain in February 2019.  Her primary care physician who manages her diabetes mellitus detected blood in her urine which is felt to be likely from the GYN tract.  She was seen by her OB/GYN in early March 2019 and a transvaginal ultrasound scan was performed on September 04, 2017.  It reveals a uterus of normal size and echotexture.  The endometrium was 4.4 mm.  The right adnexa included a 15 cm cystic multiloculated mass with positive flow.  There was a solid area also noted measuring 5.4 x 5 cm.  The left adnexa include a multiloculated mass that measured 10 cm with positive flow.  The cul-de-sac was negative.  An endometrial biopsy was performed at that visit which revealed proliferative benign endometrium and endocervical mucosa.  No hyperplasia or atypia or malignancy was identified.  A Ca125 was drawn on September 04, 2017 and this was very elevated at 825.  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 Wakefield.  She was reoperated on in February 2019 with all   benign fibro-adenomatoid changes.  The patient is a heavy smoker and intermittently smokes 1-1/2 packs/day.  She has type 2 diabetes mellitus for which she takes metformin.  She is nulliparous.  She has no documented history of treatment for infertility.  She is a remote history of oral contraceptive use for less than a decade.  She has had no prior abdominal  surgeries.  The family history is significant for 2 paternal aunts with pancreatic cancer.  She has a maternal aunt who had a cancer of unknown etiology.  Her maternal grandmother had breast and lung cancer.   09/18/17 - CT ABDOMEN AND PELVIS WITH CONTRAST TECHNIQUE: Multidetector CT imaging of the abdomen and pelvis was performed using the standard protocol following bolus administration of intravenous contrast. CONTRAST:  100mL ISOVUE-300 IOPAMIDOL (ISOVUE-300) INJECTION 61% COMPARISON:  Pelvic sonography dated 09/04/2017 FINDINGS: Lower chest: Unremarkable  Hepatobiliary: Diffuse hepatic steatosis with some fatty sparing along the gallbladder fossa. Gallbladder unremarkable. No biliary dilatation.  Pancreas: Unremarkable  Spleen: Unremarkable  Adrenals/Urinary Tract: Adrenal glands normal.  2 mm right mid kidney nonobstructive renal calculus.  Stomach/Bowel: Unremarkable  Vascular/Lymphatic: Aortoiliac atherosclerotic vascular disease. 9 mm right pelvic sidewall lymph node on image 72/2, upper normal size. No overtly pathologic adenopathy noted.  Reproductive: Bilateral complex multi cystic adnexal masses. Inseparable from the right ovary is a 14.3 by 13.8 by 13.5 cm septated multi cystic complex mass with some linear calcifications and possible solid components. This is tangential to a left adnexal mass measuring 9.7 by 6.5 by 4.9 cm. Normal ovarian tissue is not well seen against the backdrop of these masses.  The uterus itself appears unremarkable.  Other: No current ascites. No nodularity along the omentum or peritoneal surfaces currently.  Musculoskeletal: Unremarkable  IMPRESSION: 1. Bilateral large adnexal masses inseparable from the ovaries. Appearance favors epithelial ovarian tumors. No current findings of metastatic spread. Typically in this scenario, resection is warranted. 2. Diffuse hepatic steatosis. 3.  Aortic Atherosclerosis  (ICD10-I70.0). 4. 2 mm right mid kidney nonobstructive renal calculus.   Electronically Signed   By: Walter  Liebkemann M.D.   On: 09/19/2017 11:51   Current Meds:  Outpatient Encounter Medications as of 09/29/2017  Medication Sig  . amoxicillin (AMOXIL) 500 MG capsule Take 500 mg by mouth 4 (four) times daily.  . atorvastatin (LIPITOR) 10 MG tablet Take 1 tablet (10 mg total) by mouth daily. (Patient taking differently: Take 10 mg by mouth daily after supper. )  . glucose blood test strip Check sugar once daily  Dx: DMII controlled, non-insulin, without complications  . glucose monitoring kit (FREESTYLE) monitoring kit 1 each by Does not apply route as needed for other. Dx: DMII non-insulin dependent without complications; controlled;  . ibuprofen (ADVIL,MOTRIN) 800 MG tablet Take 800 mg by mouth every 8 (eight) hours as needed for fever, headache, mild pain, moderate pain or cramping.  . Lancets MISC Check sugar once daily dx DMII controlled, non-insulin, without complications  . metFORMIN (GLUCOPHAGE) 500 MG tablet Take 1 tablet (500 mg total) by mouth daily. (Patient taking differently: Take 500 mg by mouth daily after supper. )  . HYDROcodone-acetaminophen (NORCO/VICODIN) 5-325 MG tablet Take 1 tablet by mouth every 4 (four) hours as needed for moderate pain (as needed postop pain. Use pills from dentist before using this.).  . meloxicam (MOBIC) 15 MG tablet Take 15 mg by mouth daily as needed (for pain/inflammation.).   . traMADol (ULTRAM) 50 MG tablet Take 1 tablet (50 mg total) by mouth every   8 (eight) hours as needed. (Patient not taking: Reported on 09/29/2017)  . [DISCONTINUED] HYDROcodone-acetaminophen (NORCO) 10-325 MG tablet Take 1 tablet by mouth every 6 (six) hours as needed. (Patient taking differently: Take 1 tablet by mouth every 6 (six) hours as needed (for pain.). )  . [DISCONTINUED] HYDROcodone-acetaminophen (NORCO/VICODIN) 5-325 MG tablet TAKE 1 TABLET EVERY 6 HOURS AS  NEEDED DENTAL PAIN  . [DISCONTINUED] HYDROcodone-acetaminophen (NORCO/VICODIN) 5-325 MG tablet Take 1 tablet by mouth every 4 (four) hours as needed for moderate pain (as needed for postoperative pain. Use your prescription from the dental provider before using this.).   No facility-administered encounter medications on file as of 09/29/2017.     Allergy:  Allergies  Allergen Reactions  . Percocet [Oxycodone-Acetaminophen] Nausea And Vomiting    Social Hx:   Social History   Socioeconomic History  . Marital status: Single    Spouse name: Not on file  . Number of children: Not on file  . Years of education: Not on file  . Highest education level: Not on file  Occupational History  . Not on file  Social Needs  . Financial resource strain: Not on file  . Food insecurity:    Worry: Not on file    Inability: Not on file  . Transportation needs:    Medical: Not on file    Non-medical: Not on file  Tobacco Use  . Smoking status: Current Every Day Smoker    Packs/day: 0.00    Years: 30.00    Pack years: 0.00    Types: Cigarettes  . Smokeless tobacco: Never Used  . Tobacco comment: 1 daily  Substance and Sexual Activity  . Alcohol use: No    Frequency: Never  . Drug use: No  . Sexual activity: Not Currently    Partners: Female    Comment: 1st intercourse 47 yo-More than 5 partners  Lifestyle  . Physical activity:    Days per week: Not on file    Minutes per session: Not on file  . Stress: Not on file  Relationships  . Social connections:    Talks on phone: Not on file    Gets together: Not on file    Attends religious service: Not on file    Active member of club or organization: Not on file    Attends meetings of clubs or organizations: Not on file    Relationship status: Not on file  . Intimate partner violence:    Fear of current or ex partner: Not on file    Emotionally abused: Not on file    Physically abused: Not on file    Forced sexual activity: Not on file   Other Topics Concern  . Not on file  Social History Narrative   Marital status: single; girlfriend x 3 years; from [place]; moved [employer] in 2015      Children: none      Lives: with girlfriend      Employment: works at [employer]; runs seasonal department      Tobacco: 1.5ppd x age 15.  Never quit.      Alcohol: None; rare      Drugs: week as teenager      Exercise: none      Sexual activity: females since 1998; males prior.  One STD/Gonorrhea.    Past Surgical Hx:  Past Surgical History:  Procedure Laterality Date  . BREAST BIOPSY Left 07/24/2017   Procedure: LEFT BREAST MASS EXCISIONAL BIOPSY;  Surgeon: Wakefield, Matthew, MD;    Location: [facility];  Service: General;  Laterality: Left;  . FOOT GANGLION EXCISION Left    age 57  . RE-EXCISION OF BREAST LUMPECTOMY Left 08/25/2017   Procedure: LEFT RE-EXCISION OF BREAST MARGINS ERAS PATHWAY;  Surgeon: Rolm Bookbinder, MD;  Location: [facility];  Service: General;  Laterality: Left;    Past Medical Hx:  Past Medical History:  Diagnosis Date  . Breast mass, left 07/2017  . Diabetes mellitus without complication ([place])   . Migraines     Past Gynecological History:   Patient's last menstrual period was 09/21/2017 (exact date).  Family Hx:  Family History  Problem Relation Age of Onset  . Hyperlipidemia Mother   . Alcohol abuse Mother   . Cirrhosis Mother   . Diabetes Father   . Hyperlipidemia Father   . Hypertension Father   . Stroke Father   . Heart disease Father 68       CAD with stenting multiple  . Hyperlipidemia Brother   . Hypertension Brother   . Breast cancer Maternal Grandmother        Post menopausal  . Pancreatic cancer Paternal Aunt   . Pancreatic cancer Paternal Aunt     Review of Systems:  Constitutional  Feels well,    ENT Normal appearing ears and nares bilaterally Skin/Breast  No rash, sores, jaundice, itching, dryness Cardiovascular  No chest pain, shortness of  breath, or edema  Pulmonary  No cough or wheeze.  Gastro Intestinal  No nausea, vomitting, or diarrhoea. No bright red blood per rectum, +intermittent abdominal pain, no change in bowel movement, or constipation.  Genito Urinary  No frequency, urgency, dysuria, + intermittent spotting with heavy bleeding x 2 years Musculo Skeletal  Negative for weakness Neurologic  No weakness, numbness, change in gait,  Psychology  No depression, anxiety, insomnia.   Vitals:  Blood pressure (!) 154/84, pulse 85, temperature 98.3 F (36.8 C), temperature source Oral, resp. rate 20, weight 217 lb 1.6 oz (98.5 kg), last menstrual period 09/21/2017, SpO2 100 %. Body mass index is 34 kg/m.  Physical Exam: ECOG PERFORMANCE STATUS: 1 - Symptomatic but completely ambulatory   General :  Well developed, 47 y.o., female in no apparent distress HEENT:  Normocephalic/atraumatic, symmetric, EOMI, eyelids normal Neck:   No visible masses.  Respiratory:  Respirations unlabored, no use of accessory muscles CV:   Deferred Breast:  Deferred Musculoskeletal: Normal muscle strength. Extremities:  No visible edema or deformities Skin:   Normal inspection Neuro/Psych:  No focal motor deficit, no abnormal mental status. Normal gait. Normal affect. Alert and oriented to person, place, and time  Pertinent positive findings from visit 09/10/17 as follows: Abdo- Can appreciate mass extending from pelvis to mid abdomen - smooth, somewhat mobile. Back - No CVA tenderness Genito Urinary  Vulva/vagina: Normal external female genitalia.   No lesions. No discharge or bleeding.  Bladder/urethra:  No lesions or masses, well supported bladder  Vagina: normal  Cervix: Normal appearing, no lesions.  Uterus:  Small, mobile,deviated anteriorally by posterior adnexal mass no parametrial involvement or nodularity.  Adnexa: posterior large adnexal masses with nodular cystic mass palpable on posterior upper vagina. Rectal  Good tone,  no masses no cul de sac nodularity. Unable to appreciated tumor infiltration of rectal wall   Isabel Caprice, MD  09/29/2017, 11:44 AM

## 2017-10-01 ENCOUNTER — Encounter: Payer: Self-pay | Admitting: Obstetrics

## 2017-10-01 NOTE — Anesthesia Preprocedure Evaluation (Addendum)
Anesthesia Evaluation  Patient identified by MRN, date of birth, ID band Patient awake    Reviewed: Allergy & Precautions, NPO status , Patient's Chart, lab work & pertinent test results  Airway Mallampati: II  TM Distance: >3 FB Neck ROM: Full    Dental no notable dental hx.    Pulmonary Current Smoker,    Pulmonary exam normal breath sounds clear to auscultation       Cardiovascular negative cardio ROS Normal cardiovascular exam Rhythm:Regular Rate:Normal  ECG: NSR, rate 75   Neuro/Psych  Headaches, negative neurological ROS  negative psych ROS   GI/Hepatic negative GI ROS, Neg liver ROS,   Endo/Other  diabetes, Oral Hypoglycemic Agents  Renal/GU negative Renal ROS  negative genitourinary   Musculoskeletal negative musculoskeletal ROS (+)   Abdominal (+) + obese,   Peds negative pediatric ROS (+)  Hematology negative hematology ROS (+) HLD   Anesthesia Other Findings BILATERAL OVARIAN MASSES ELEVATED CA125  Reproductive/Obstetrics negative OB ROS hcg negative                            Anesthesia Physical Anesthesia Plan  ASA: III  Anesthesia Plan: General   Post-op Pain Management:    Induction: Intravenous  PONV Risk Score and Plan: 3 and Midazolam, Scopolamine patch - Pre-op, Dexamethasone, Ondansetron and Treatment may vary due to age or medical condition  Airway Management Planned: Oral ETT  Additional Equipment:   Intra-op Plan:   Post-operative Plan: Extubation in OR  Informed Consent: I have reviewed the patients History and Physical, chart, labs and discussed the procedure including the risks, benefits and alternatives for the proposed anesthesia with the patient or authorized representative who has indicated his/her understanding and acceptance.   Dental advisory given  Plan Discussed with: CRNA and Surgeon  Anesthesia Plan Comments:        Anesthesia  Quick Evaluation

## 2017-10-02 ENCOUNTER — Inpatient Hospital Stay (HOSPITAL_COMMUNITY): Payer: 59 | Admitting: Anesthesiology

## 2017-10-02 ENCOUNTER — Encounter (HOSPITAL_COMMUNITY): Payer: Self-pay | Admitting: *Deleted

## 2017-10-02 ENCOUNTER — Inpatient Hospital Stay (HOSPITAL_COMMUNITY)
Admission: RE | Admit: 2017-10-02 | Discharge: 2017-10-06 | DRG: 743 | Disposition: A | Discharge status: Home / Self Care | Payer: 59 | Source: Ambulatory Visit | Attending: Obstetrics | Admitting: Obstetrics

## 2017-10-02 ENCOUNTER — Encounter (HOSPITAL_COMMUNITY)
Admission: RE | Disposition: A | Discharge status: Home / Self Care | Payer: Self-pay | Source: Ambulatory Visit | Attending: Obstetrics

## 2017-10-02 ENCOUNTER — Other Ambulatory Visit: Payer: Self-pay

## 2017-10-02 DIAGNOSIS — D270 Benign neoplasm of right ovary: Secondary | ICD-10-CM

## 2017-10-02 DIAGNOSIS — Z8249 Family history of ischemic heart disease and other diseases of the circulatory system: Secondary | ICD-10-CM | POA: Diagnosis not present

## 2017-10-02 DIAGNOSIS — R971 Elevated cancer antigen 125 [CA 125]: Secondary | ICD-10-CM | POA: Diagnosis present

## 2017-10-02 DIAGNOSIS — Z800 Family history of malignant neoplasm of digestive organs: Secondary | ICD-10-CM | POA: Diagnosis not present

## 2017-10-02 DIAGNOSIS — G43909 Migraine, unspecified, not intractable, without status migrainosus: Secondary | ICD-10-CM | POA: Diagnosis present

## 2017-10-02 DIAGNOSIS — E119 Type 2 diabetes mellitus without complications: Secondary | ICD-10-CM

## 2017-10-02 DIAGNOSIS — K760 Fatty (change of) liver, not elsewhere classified: Secondary | ICD-10-CM | POA: Diagnosis present

## 2017-10-02 DIAGNOSIS — Z803 Family history of malignant neoplasm of breast: Secondary | ICD-10-CM

## 2017-10-02 DIAGNOSIS — Z8349 Family history of other endocrine, nutritional and metabolic diseases: Secondary | ICD-10-CM | POA: Diagnosis not present

## 2017-10-02 DIAGNOSIS — I700 Atherosclerosis of aorta: Secondary | ICD-10-CM | POA: Diagnosis present

## 2017-10-02 DIAGNOSIS — Z791 Long term (current) use of non-steroidal anti-inflammatories (NSAID): Secondary | ICD-10-CM

## 2017-10-02 DIAGNOSIS — N200 Calculus of kidney: Secondary | ICD-10-CM | POA: Diagnosis present

## 2017-10-02 DIAGNOSIS — Z833 Family history of diabetes mellitus: Secondary | ICD-10-CM

## 2017-10-02 DIAGNOSIS — D4959 Neoplasm of unspecified behavior of other genitourinary organ: Secondary | ICD-10-CM | POA: Diagnosis not present

## 2017-10-02 DIAGNOSIS — D271 Benign neoplasm of left ovary: Secondary | ICD-10-CM

## 2017-10-02 DIAGNOSIS — Z885 Allergy status to narcotic agent status: Secondary | ICD-10-CM | POA: Diagnosis not present

## 2017-10-02 DIAGNOSIS — Z6833 Body mass index (BMI) 33.0-33.9, adult: Secondary | ICD-10-CM

## 2017-10-02 DIAGNOSIS — Z823 Family history of stroke: Secondary | ICD-10-CM | POA: Diagnosis not present

## 2017-10-02 DIAGNOSIS — F17210 Nicotine dependence, cigarettes, uncomplicated: Secondary | ICD-10-CM | POA: Diagnosis present

## 2017-10-02 DIAGNOSIS — N879 Dysplasia of cervix uteri, unspecified: Secondary | ICD-10-CM | POA: Diagnosis not present

## 2017-10-02 DIAGNOSIS — R8569 Abnormal cytological findings in specimens from other digestive organs and abdominal cavity: Secondary | ICD-10-CM | POA: Diagnosis not present

## 2017-10-02 DIAGNOSIS — R1900 Intra-abdominal and pelvic swelling, mass and lump, unspecified site: Secondary | ICD-10-CM

## 2017-10-02 DIAGNOSIS — Z811 Family history of alcohol abuse and dependence: Secondary | ICD-10-CM

## 2017-10-02 DIAGNOSIS — N83202 Unspecified ovarian cyst, left side: Secondary | ICD-10-CM | POA: Diagnosis present

## 2017-10-02 DIAGNOSIS — Z801 Family history of malignant neoplasm of trachea, bronchus and lung: Secondary | ICD-10-CM | POA: Diagnosis not present

## 2017-10-02 DIAGNOSIS — N83201 Unspecified ovarian cyst, right side: Secondary | ICD-10-CM

## 2017-10-02 DIAGNOSIS — Z7984 Long term (current) use of oral hypoglycemic drugs: Secondary | ICD-10-CM

## 2017-10-02 DIAGNOSIS — E669 Obesity, unspecified: Secondary | ICD-10-CM | POA: Diagnosis present

## 2017-10-02 HISTORY — PX: DEBULKING: SHX6277

## 2017-10-02 HISTORY — PX: HYSTERECTOMY ABDOMINAL WITH SALPINGO-OOPHORECTOMY: SHX6792

## 2017-10-02 HISTORY — PX: LAPAROTOMY WITH STAGING: SHX5938

## 2017-10-02 LAB — TYPE AND SCREEN
ABO/RH(D): O POS
Antibody Screen: NEGATIVE

## 2017-10-02 LAB — GLUCOSE, CAPILLARY
Glucose-Capillary: 156 mg/dL — ABNORMAL HIGH (ref 65–99)
Glucose-Capillary: 197 mg/dL — ABNORMAL HIGH (ref 65–99)
Glucose-Capillary: 197 mg/dL — ABNORMAL HIGH (ref 65–99)
Glucose-Capillary: 202 mg/dL — ABNORMAL HIGH (ref 65–99)

## 2017-10-02 SURGERY — HYSTERECTOMY, ABDOMINAL, WITH SALPINGO-OOPHORECTOMY
Anesthesia: General | Site: Abdomen

## 2017-10-02 MED ORDER — HYDROMORPHONE HCL 1 MG/ML IJ SOLN
INTRAMUSCULAR | Status: AC
  Filled 2017-10-02: qty 1

## 2017-10-02 MED ORDER — NICOTINE 7 MG/24HR TD PT24
7.0000 mg | MEDICATED_PATCH | Freq: Every day | TRANSDERMAL | Status: DC
  Administered 2017-10-02 – 2017-10-03 (×2): 7 mg via TRANSDERMAL
  Filled 2017-10-02 (×5): qty 1

## 2017-10-02 MED ORDER — BUPIVACAINE HCL (PF) 0.25 % IJ SOLN
INTRAMUSCULAR | Status: AC
  Filled 2017-10-02: qty 30

## 2017-10-02 MED ORDER — PROPOFOL 10 MG/ML IV BOLUS
INTRAVENOUS | Status: DC | PRN
  Administered 2017-10-02: 20 mg via INTRAVENOUS
  Administered 2017-10-02: 180 mg via INTRAVENOUS

## 2017-10-02 MED ORDER — OXYCODONE HCL 5 MG PO TABS
5.0000 mg | ORAL_TABLET | ORAL | Status: DC | PRN
  Administered 2017-10-02 – 2017-10-06 (×11): 5 mg via ORAL
  Filled 2017-10-02 (×11): qty 1

## 2017-10-02 MED ORDER — SUGAMMADEX SODIUM 200 MG/2ML IV SOLN
INTRAVENOUS | Status: AC
  Filled 2017-10-02: qty 2

## 2017-10-02 MED ORDER — ACETAMINOPHEN 500 MG PO TABS
1000.0000 mg | ORAL_TABLET | Freq: Four times a day (QID) | ORAL | Status: DC
  Administered 2017-10-02 – 2017-10-06 (×14): 1000 mg via ORAL
  Filled 2017-10-02 (×15): qty 2

## 2017-10-02 MED ORDER — FENTANYL CITRATE (PF) 250 MCG/5ML IJ SOLN
INTRAMUSCULAR | Status: AC
  Filled 2017-10-02: qty 5

## 2017-10-02 MED ORDER — CHEWING GUM (ORBIT) SUGAR FREE
1.0000 | CHEWING_GUM | Freq: Three times a day (TID) | ORAL | Status: DC
  Administered 2017-10-02 – 2017-10-06 (×11): 1 via ORAL
  Filled 2017-10-02: qty 1

## 2017-10-02 MED ORDER — PREGABALIN 75 MG PO CAPS
75.0000 mg | ORAL_CAPSULE | Freq: Two times a day (BID) | ORAL | Status: DC
  Administered 2017-10-03 – 2017-10-06 (×7): 75 mg via ORAL
  Filled 2017-10-02 (×7): qty 1

## 2017-10-02 MED ORDER — PROPOFOL 10 MG/ML IV BOLUS
INTRAVENOUS | Status: AC
  Filled 2017-10-02: qty 20

## 2017-10-02 MED ORDER — MIDAZOLAM HCL 2 MG/2ML IJ SOLN
INTRAMUSCULAR | Status: AC
  Filled 2017-10-02: qty 2

## 2017-10-02 MED ORDER — ONDANSETRON HCL 4 MG/2ML IJ SOLN
4.0000 mg | Freq: Four times a day (QID) | INTRAMUSCULAR | Status: DC | PRN
  Administered 2017-10-02: 4 mg via INTRAVENOUS
  Filled 2017-10-02: qty 2

## 2017-10-02 MED ORDER — KETOROLAC TROMETHAMINE 30 MG/ML IJ SOLN
30.0000 mg | Freq: Four times a day (QID) | INTRAMUSCULAR | Status: AC
  Administered 2017-10-02 – 2017-10-03 (×2): 30 mg via INTRAVENOUS
  Filled 2017-10-02 (×2): qty 1

## 2017-10-02 MED ORDER — PROMETHAZINE HCL 25 MG/ML IJ SOLN
6.2500 mg | INTRAMUSCULAR | Status: DC | PRN
  Administered 2017-10-02: 12.5 mg via INTRAVENOUS

## 2017-10-02 MED ORDER — KETOROLAC TROMETHAMINE 30 MG/ML IJ SOLN: 30.0000 mg | Freq: Once | INTRAMUSCULAR | Status: DC | PRN

## 2017-10-02 MED ORDER — CEFAZOLIN SODIUM-DEXTROSE 2-4 GM/100ML-% IV SOLN
2.0000 g | INTRAVENOUS | Status: AC
  Administered 2017-10-02: 2 g via INTRAVENOUS
  Filled 2017-10-02: qty 100

## 2017-10-02 MED ORDER — LACTATED RINGERS IV SOLN
INTRAVENOUS | Status: DC
  Administered 2017-10-02 (×4): via INTRAVENOUS

## 2017-10-02 MED ORDER — KETOROLAC TROMETHAMINE 30 MG/ML IJ SOLN
INTRAMUSCULAR | Status: DC | PRN
  Administered 2017-10-02: 30 mg via INTRAVENOUS

## 2017-10-02 MED ORDER — ROCURONIUM BROMIDE 10 MG/ML (PF) SYRINGE
PREFILLED_SYRINGE | INTRAVENOUS | Status: AC
  Filled 2017-10-02: qty 10

## 2017-10-02 MED ORDER — IBUPROFEN 200 MG PO TABS
600.0000 mg | ORAL_TABLET | Freq: Four times a day (QID) | ORAL | Status: DC
  Administered 2017-10-03 – 2017-10-06 (×10): 600 mg via ORAL
  Filled 2017-10-02 (×12): qty 3

## 2017-10-02 MED ORDER — SUGAMMADEX SODIUM 200 MG/2ML IV SOLN
INTRAVENOUS | Status: DC | PRN
  Administered 2017-10-02: 200 mg via INTRAVENOUS

## 2017-10-02 MED ORDER — DEXAMETHASONE SODIUM PHOSPHATE 10 MG/ML IJ SOLN
INTRAMUSCULAR | Status: AC
  Filled 2017-10-02: qty 1

## 2017-10-02 MED ORDER — MIDAZOLAM HCL 2 MG/2ML IJ SOLN
INTRAMUSCULAR | Status: DC | PRN
  Administered 2017-10-02: 2 mg via INTRAVENOUS

## 2017-10-02 MED ORDER — ENOXAPARIN SODIUM 40 MG/0.4ML ~~LOC~~ SOLN
40.0000 mg | SUBCUTANEOUS | Status: DC
  Administered 2017-10-03 – 2017-10-06 (×4): 40 mg via SUBCUTANEOUS
  Filled 2017-10-02 (×4): qty 0.4

## 2017-10-02 MED ORDER — SODIUM CHLORIDE 0.9 % IJ SOLN
INTRAMUSCULAR | Status: AC
  Filled 2017-10-02: qty 20

## 2017-10-02 MED ORDER — KETOROLAC TROMETHAMINE 30 MG/ML IJ SOLN
INTRAMUSCULAR | Status: AC
  Administered 2017-10-02: 15:00:00
  Filled 2017-10-02: qty 1

## 2017-10-02 MED ORDER — FENTANYL CITRATE (PF) 250 MCG/5ML IJ SOLN
INTRAMUSCULAR | Status: DC | PRN
  Administered 2017-10-02: 100 ug via INTRAVENOUS
  Administered 2017-10-02: 50 ug via INTRAVENOUS
  Administered 2017-10-02: 75 ug via INTRAVENOUS
  Administered 2017-10-02 (×4): 50 ug via INTRAVENOUS
  Administered 2017-10-02: 100 ug via INTRAVENOUS
  Administered 2017-10-02: 50 ug via INTRAVENOUS
  Administered 2017-10-02: 125 ug via INTRAVENOUS

## 2017-10-02 MED ORDER — DEXAMETHASONE SODIUM PHOSPHATE 10 MG/ML IJ SOLN
INTRAMUSCULAR | Status: DC | PRN
  Administered 2017-10-02: 10 mg via INTRAVENOUS

## 2017-10-02 MED ORDER — KETOROLAC TROMETHAMINE 30 MG/ML IJ SOLN: 30.0000 mg | Freq: Four times a day (QID) | INTRAMUSCULAR | Status: AC

## 2017-10-02 MED ORDER — 0.9 % SODIUM CHLORIDE (POUR BTL) OPTIME
TOPICAL | Status: DC | PRN
  Administered 2017-10-02: 1000 mL

## 2017-10-02 MED ORDER — PROMETHAZINE HCL 25 MG/ML IJ SOLN
INTRAMUSCULAR | Status: AC
  Filled 2017-10-02: qty 1

## 2017-10-02 MED ORDER — KCL IN DEXTROSE-NACL 20-5-0.45 MEQ/L-%-% IV SOLN
INTRAVENOUS | Status: DC
  Administered 2017-10-02: 15:00:00 via INTRAVENOUS
  Filled 2017-10-02: qty 1000

## 2017-10-02 MED ORDER — FENTANYL CITRATE (PF) 100 MCG/2ML IJ SOLN
INTRAMUSCULAR | Status: AC
  Filled 2017-10-02: qty 2

## 2017-10-02 MED ORDER — LIDOCAINE HCL (CARDIAC) 20 MG/ML IV SOLN
INTRAVENOUS | Status: DC | PRN
  Administered 2017-10-02: 100 mg via INTRAVENOUS

## 2017-10-02 MED ORDER — ONDANSETRON HCL 4 MG/2ML IJ SOLN
INTRAMUSCULAR | Status: DC | PRN
  Administered 2017-10-02: 4 mg via INTRAVENOUS

## 2017-10-02 MED ORDER — ROCURONIUM BROMIDE 100 MG/10ML IV SOLN
INTRAVENOUS | Status: DC | PRN
  Administered 2017-10-02: 20 mg via INTRAVENOUS
  Administered 2017-10-02: 10 mg via INTRAVENOUS
  Administered 2017-10-02: 20 mg via INTRAVENOUS
  Administered 2017-10-02: 60 mg via INTRAVENOUS
  Administered 2017-10-02 (×2): 10 mg via INTRAVENOUS
  Administered 2017-10-02 (×2): 20 mg via INTRAVENOUS

## 2017-10-02 MED ORDER — STERILE WATER FOR IRRIGATION IR SOLN
Status: DC | PRN
  Administered 2017-10-02: 2000 mL

## 2017-10-02 MED ORDER — GLUCERNA SHAKE PO LIQD
237.0000 mL | Freq: Three times a day (TID) | ORAL | Status: DC
  Administered 2017-10-03 – 2017-10-05 (×3): 237 mL via ORAL
  Filled 2017-10-02 (×12): qty 237

## 2017-10-02 MED ORDER — SODIUM CHLORIDE 0.9 % IJ SOLN
INTRAMUSCULAR | Status: DC | PRN
  Administered 2017-10-02: 20 mL

## 2017-10-02 MED ORDER — INSULIN ASPART 100 UNIT/ML ~~LOC~~ SOLN
SUBCUTANEOUS | Status: AC
  Administered 2017-10-02: 3 [IU] via SUBCUTANEOUS
  Filled 2017-10-02: qty 1

## 2017-10-02 MED ORDER — ONDANSETRON HCL 4 MG PO TABS
4.0000 mg | ORAL_TABLET | Freq: Four times a day (QID) | ORAL | Status: DC | PRN
  Administered 2017-10-04: 4 mg via ORAL
  Filled 2017-10-02: qty 1

## 2017-10-02 MED ORDER — INSULIN GLARGINE 100 UNIT/ML ~~LOC~~ SOLN
20.0000 [IU] | Freq: Every day | SUBCUTANEOUS | Status: DC
  Administered 2017-10-02 – 2017-10-05 (×4): 20 [IU] via SUBCUTANEOUS
  Filled 2017-10-02 (×5): qty 0.2

## 2017-10-02 MED ORDER — NON FORMULARY: 1.0000 [IU] | Freq: Three times a day (TID) | Status: DC

## 2017-10-02 MED ORDER — PHENYLEPHRINE 40 MCG/ML (10ML) SYRINGE FOR IV PUSH (FOR BLOOD PRESSURE SUPPORT)
PREFILLED_SYRINGE | INTRAVENOUS | Status: AC
  Filled 2017-10-02: qty 10

## 2017-10-02 MED ORDER — BUPIVACAINE LIPOSOME 1.3 % IJ SUSP
20.0000 mL | Freq: Once | INTRAMUSCULAR | Status: AC
  Administered 2017-10-02: 20 mL
  Filled 2017-10-02: qty 20

## 2017-10-02 MED ORDER — SCOPOLAMINE 1 MG/3DAYS TD PT72
MEDICATED_PATCH | TRANSDERMAL | Status: DC | PRN
  Administered 2017-10-02: 1 via TRANSDERMAL

## 2017-10-02 MED ORDER — LIDOCAINE 2% (20 MG/ML) 5 ML SYRINGE
INTRAMUSCULAR | Status: AC
  Filled 2017-10-02: qty 5

## 2017-10-02 MED ORDER — KETOROLAC TROMETHAMINE 30 MG/ML IJ SOLN
INTRAMUSCULAR | Status: AC
  Filled 2017-10-02: qty 1

## 2017-10-02 MED ORDER — PHENYLEPHRINE HCL 10 MG/ML IJ SOLN
INTRAMUSCULAR | Status: DC | PRN
  Administered 2017-10-02: 40 ug via INTRAVENOUS
  Administered 2017-10-02: 80 ug via INTRAVENOUS
  Administered 2017-10-02: 40 ug via INTRAVENOUS
  Administered 2017-10-02: 80 ug via INTRAVENOUS
  Administered 2017-10-02 (×2): 40 ug via INTRAVENOUS

## 2017-10-02 MED ORDER — HYDROMORPHONE HCL 1 MG/ML IJ SOLN
0.2500 mg | INTRAMUSCULAR | Status: DC | PRN
  Administered 2017-10-02 (×4): 0.5 mg via INTRAVENOUS

## 2017-10-02 MED ORDER — BUPIVACAINE HCL 0.25 % IJ SOLN
INTRAMUSCULAR | Status: DC | PRN
  Administered 2017-10-02: 20 mL

## 2017-10-02 MED ORDER — HYDROMORPHONE HCL 2 MG/ML IJ SOLN
INTRAMUSCULAR | Status: AC
  Filled 2017-10-02: qty 1

## 2017-10-02 MED ORDER — ATORVASTATIN CALCIUM 10 MG PO TABS
10.0000 mg | ORAL_TABLET | Freq: Every day | ORAL | Status: DC
  Administered 2017-10-03 – 2017-10-06 (×4): 10 mg via ORAL
  Filled 2017-10-02 (×4): qty 1

## 2017-10-02 MED ORDER — ROCURONIUM BROMIDE 10 MG/ML (PF) SYRINGE
PREFILLED_SYRINGE | INTRAVENOUS | Status: AC
  Filled 2017-10-02: qty 5

## 2017-10-02 MED ORDER — ONDANSETRON HCL 4 MG/2ML IJ SOLN
INTRAMUSCULAR | Status: AC
  Filled 2017-10-02: qty 2

## 2017-10-02 MED ORDER — SCOPOLAMINE 1 MG/3DAYS TD PT72
MEDICATED_PATCH | TRANSDERMAL | Status: AC
  Filled 2017-10-02: qty 1

## 2017-10-02 MED ORDER — INSULIN ASPART 100 UNIT/ML ~~LOC~~ SOLN
3.0000 [IU] | Freq: Once | SUBCUTANEOUS | Status: AC
  Administered 2017-10-02: 3 [IU] via SUBCUTANEOUS

## 2017-10-02 MED ORDER — HYDROMORPHONE HCL 1 MG/ML IJ SOLN
INTRAMUSCULAR | Status: DC | PRN
  Administered 2017-10-02 (×2): 1 mg via INTRAVENOUS

## 2017-10-02 MED ORDER — HYDROMORPHONE HCL 1 MG/ML IJ SOLN
0.5000 mg | INTRAMUSCULAR | Status: DC | PRN
  Administered 2017-10-02: 0.5 mg via INTRAVENOUS
  Filled 2017-10-02: qty 0.5

## 2017-10-02 MED ORDER — SENNOSIDES-DOCUSATE SODIUM 8.6-50 MG PO TABS
2.0000 | ORAL_TABLET | Freq: Every day | ORAL | Status: DC
  Administered 2017-10-02 – 2017-10-05 (×4): 2 via ORAL
  Filled 2017-10-02 (×4): qty 2

## 2017-10-02 MED ORDER — INSULIN ASPART 100 UNIT/ML ~~LOC~~ SOLN
0.0000 [IU] | Freq: Three times a day (TID) | SUBCUTANEOUS | Status: DC
  Administered 2017-10-02 – 2017-10-03 (×4): 3 [IU] via SUBCUTANEOUS
  Administered 2017-10-04 – 2017-10-05 (×5): 2 [IU] via SUBCUTANEOUS

## 2017-10-02 SURGICAL SUPPLY — 63 items
ATTRACTOMAT 16X20 MAGNETIC DRP (DRAPES) ×3 IMPLANT
BENZOIN TINCTURE PRP APPL 2/3 (GAUZE/BANDAGES/DRESSINGS) ×3 IMPLANT
BLADE EXTENDED COATED 6.5IN (ELECTRODE) ×3 IMPLANT
CHLORAPREP W/TINT 26ML (MISCELLANEOUS) ×3 IMPLANT
CLIP VESOCCLUDE LG 6/CT (CLIP) ×3 IMPLANT
CLIP VESOCCLUDE MED 6/CT (CLIP) ×3 IMPLANT
CLIP VESOCCLUDE MED LG 6/CT (CLIP) ×3 IMPLANT
CONT SPEC 4OZ CLIKSEAL STRL BL (MISCELLANEOUS) ×3 IMPLANT
DRAPE INCISE IOBAN 66X45 STRL (DRAPES) ×3 IMPLANT
DRAPE SHEET LG 3/4 BI-LAMINATE (DRAPES) ×3 IMPLANT
DRAPE WARM FLUID 44X44 (DRAPE) ×3 IMPLANT
DRSG OPSITE POSTOP 4X10 (GAUZE/BANDAGES/DRESSINGS) IMPLANT
DRSG OPSITE POSTOP 4X12 (GAUZE/BANDAGES/DRESSINGS) IMPLANT
DRSG OPSITE POSTOP 4X8 (GAUZE/BANDAGES/DRESSINGS) IMPLANT
DRSG TELFA 3X8 NADH (GAUZE/BANDAGES/DRESSINGS) ×3 IMPLANT
ELECT REM PT RETURN 15FT ADLT (MISCELLANEOUS) ×3 IMPLANT
GAUZE SPONGE 4X4 12PLY STRL (GAUZE/BANDAGES/DRESSINGS) ×3 IMPLANT
GAUZE SPONGE 4X4 16PLY XRAY LF (GAUZE/BANDAGES/DRESSINGS) IMPLANT
GLOVE BIO SURGEON STRL SZ 6.5 (GLOVE) ×6 IMPLANT
GLOVE BIOGEL PI IND STRL 6.5 (GLOVE) ×2 IMPLANT
GLOVE BIOGEL PI IND STRL 7.0 (GLOVE) ×2 IMPLANT
GLOVE BIOGEL PI INDICATOR 6.5 (GLOVE) ×1
GLOVE BIOGEL PI INDICATOR 7.0 (GLOVE) ×1
GLOVE SURG SS PI 6.5 STRL IVOR (GLOVE) ×6 IMPLANT
GOWN STRL REUS W/ TWL LRG LVL3 (GOWN DISPOSABLE) ×2 IMPLANT
GOWN STRL REUS W/TWL LRG LVL3 (GOWN DISPOSABLE) ×4 IMPLANT
HEMOSTAT ARISTA ABSORB 3G PWDR (MISCELLANEOUS) IMPLANT
KIT BASIN OR (CUSTOM PROCEDURE TRAY) ×3 IMPLANT
LIGASURE IMPACT 36 18CM CVD LR (INSTRUMENTS) IMPLANT
NEEDLE HYPO 22GX1.5 SAFETY (NEEDLE) ×6 IMPLANT
PACK GENERAL/GYN (CUSTOM PROCEDURE TRAY) ×3 IMPLANT
SEPRAFILM MEMBRANE 5X6 (MISCELLANEOUS) ×3 IMPLANT
SHEET LAVH (DRAPES) ×3 IMPLANT
SPONGE LAP 18X18 X RAY DECT (DISPOSABLE) ×9 IMPLANT
STAPLER VISISTAT 35W (STAPLE) IMPLANT
STRIP CLOSURE SKIN 1/2X4 (GAUZE/BANDAGES/DRESSINGS) ×6 IMPLANT
SUT MNCRL AB 4-0 PS2 18 (SUTURE) ×3 IMPLANT
SUT PDS AB 1 TP1 96 (SUTURE) IMPLANT
SUT PLAIN 2 0 XLH (SUTURE) ×6 IMPLANT
SUT PROLENE 5 0 CC 1 (SUTURE) IMPLANT
SUT VIC AB 0 CT1 18XCR BRD 8 (SUTURE) ×4 IMPLANT
SUT VIC AB 0 CT1 27 (SUTURE) ×10
SUT VIC AB 0 CT1 27XBRD ANTBC (SUTURE) ×20 IMPLANT
SUT VIC AB 0 CT1 36 (SUTURE) ×12 IMPLANT
SUT VIC AB 0 CT1 8-18 (SUTURE) ×2
SUT VIC AB 2-0 CT1 36 (SUTURE) IMPLANT
SUT VIC AB 2-0 CT2 27 (SUTURE) IMPLANT
SUT VIC AB 2-0 SH 27 (SUTURE)
SUT VIC AB 2-0 SH 27X BRD (SUTURE) IMPLANT
SUT VIC AB 3-0 PS2 18 (SUTURE) ×4
SUT VIC AB 3-0 PS2 18XBRD (SUTURE) ×8 IMPLANT
SUT VIC AB 3-0 SH 27 (SUTURE)
SUT VIC AB 3-0 SH 27X BRD (SUTURE) IMPLANT
SUT VIC AB 4-0 PS2 18 (SUTURE) ×9 IMPLANT
SUT VICRYL 0 TIES 12 18 (SUTURE) ×6 IMPLANT
SUT VICRYL 2 0 18  UND BR (SUTURE)
SUT VICRYL 2 0 18 UND BR (SUTURE) IMPLANT
SYR 30ML LL (SYRINGE) ×6 IMPLANT
TAPE PAPER 3X10 WHT MICROPORE (GAUZE/BANDAGES/DRESSINGS) ×3 IMPLANT
TOWEL OR 17X26 10 PK STRL BLUE (TOWEL DISPOSABLE) ×3 IMPLANT
TOWEL OR NON WOVEN STRL DISP B (DISPOSABLE) ×3 IMPLANT
TRAP SPECIMEN MUCOUS 40CC (MISCELLANEOUS) ×3 IMPLANT
TRAY FOLEY W/METER SILVER 16FR (SET/KITS/TRAYS/PACK) ×3 IMPLANT

## 2017-10-02 NOTE — Op Note (Signed)
Preoperative Diagnosis:  1.  Bilateral adnexal masses. 2.  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   Procedure(s) Performed: 1. Exploratory laparotomy with total hysterectomy bilateral salpingo-oophorectomy, pelvic lymph node dissection, omentectomy washings and biopsies for ovarian cancer .  Surgeon: Mart Piggs, MD  Assistant Surgeon: Lahoma Crocker, M.D. Assistant:   Specimens: Uterus Cervix, Bilateral tubes / ovaries, bilateral pelvic lymph nodes, peritoneal biopsies, washings and omentum.   Estimated Blood Loss: 100 mL.    Complications: None.   Operative Findings: The right adnexa measured about 16 cm and was adherent to the right broad ligament.  On frozen section this was consistent with a cyst adenofibroma.  The left adnexa measured approximately 8 cm.  The sigmoid colon was adherent to the superior surface of this lesion.  Frozen section on the left adnexa showed a small area of suspected borderline.  No evidence of intra-abdominal pelvic or peritoneal metastases.  The right adnexa had a cystic lesion that leaked at the time of surgery.  Procedure:   After induction of anesthesia, the patient was draped and prepped in the usual sterile manner.  She was prepped and draped in the normal sterile fashion in the dorsal lithotomy position. Timeout was performed.    A Foley catheter was placed to gravity by me.   A midline vertical incision was made and carried through the subcutaneous tissue to the fascia. The fascial incision was made and extended superiorally. The rectus muscles were separated. The peritoneum was identified and entered. Peritoneal incision was extended longitudinally.  The abdominal cavity was entered sharply and without incident. A Bookwalter retractor was then placed. A survey of the abdomen and pelvis revealed the above findings. Washings were collected.  After packing the  small large bowel into the upper abdomen, we began the procedure by entering the  pelvic sidewall just posterior to the right round ligament.  The right ureter was difficult to visualize and the adnexa was immobile due to what later became apparent to be adhesions to the right broad ligament.  Palpation of the right ureter confirmed its distance from the right IP ligament.  The right IP ligament was then isolated clamped transected and ligated.  The right utero-ovarian ligament was clamped and transected.   Adhesio lysis of the right adnexa from the right broad ligament and pelvic sidewall.  At this point we were able to visualize the right ureter and noted it was far from our area of dissection.  The mass eventually was freed of all adhesions and sent for frozen section.  See frozen section as above.  In the meantime the left adnexa was visualized see findings above.  Bowel was repacked into the upper abdomen given the improved visualization.  The sigmoid colon was adherent to the left adnexa and these adhesions were taken down with cautery.  Bowel was then again repacked into the upper abdomen.  The left retroperitoneal space was opened left ureter identified left IP ligament was isolated clamped transected and ligated.  The left utero-ovarian ligament was then isolated clamped transected and ligated.  The broad ligament was separated from the adnexal mass being sure to stay superior to the expected course of the ureter.  Specimen was then sent for frozen section given the benign findings on the right adnexa.  Frozen section returned with a small area concerning for possible borderline tumor.  Therefore complete hysterectomy and staging was performed in the event of  upstaging to an invasive malignancy.  Kelly clamps were placed on both uterine cornua. The round ligaments were identified and transected with the bovie. The anterior peritoneal reflection was incised and the bladder was dissected off the lower  uterine segment. Serial pedicles of the cardinal and utero-sacral ligaments were clamped, cut, and suture ligated with 0-Vicryl. Entrance was made into the vagina and the cervix amputated. The vaginal cuff angle were suture ligated with 0-Vicryl suture. The vaginal cuff was then closed with 0 Vicryl aggravate interrupted sutures. The pelvis was copiously irrigated. Hemostasis was observed.  Peritoneal biopsies of the pelvis bladder, cul de sac, and colic gutters, were collected.  I developed the left and right paravesical and pararectal spaces and performed a bilateral pelvic lymph node dissection with the following borders: proximally the bifurcation of the common iliac, distally the circumflex iliac vein, laterally the genitofemoral nerve, the medial border was the superior vesicle artery and the deep border was the obturator nerve. All lymphatic tissue was removed and sent to Pathology.  Given the patient's habitus and the lack of unknown invasive diagnosis risks with further lymphadenectomy was felt to outweigh the benefit.  Bilateral diaphragmatic peritoneal biopsies were obtained. An infragastric omentectomy was performed using a Kelly clamps and ties. Hemostasis was assured.  The fascia was reapproximated with 0 looped PDS using a total of two sutures. The subcutaneous layer was then irrigated copiously.  Local anesthetic was injected.  The subcutaneous layer was reapproximated with interrupted 2-0 gut.  The subcutaneous layer was then reapproximated with interrupted 4-0 Vicryl and then running 4-0 Monocryl.    The patient tolerated the procedure well. Sponge, lap and needle counts were correct x 2.

## 2017-10-02 NOTE — Anesthesia Procedure Notes (Signed)
Procedure Name: Intubation Date/Time: 10/02/2017 7:39 AM Performed by: Raenette Rover, CRNA Pre-anesthesia Checklist: Patient identified, Emergency Drugs available, Suction available and Patient being monitored Patient Re-evaluated:Patient Re-evaluated prior to induction Oxygen Delivery Method: Circle system utilized Preoxygenation: Pre-oxygenation with 100% oxygen Induction Type: IV induction Ventilation: Mask ventilation without difficulty and Oral airway inserted - appropriate to patient size Laryngoscope Size: Mac and 3 Grade View: Grade I Tube type: Oral Tube size: 7.0 mm Number of attempts: 1 Airway Equipment and Method: Stylet Placement Confirmation: ETT inserted through vocal cords under direct vision,  positive ETCO2,  CO2 detector and breath sounds checked- equal and bilateral Secured at: 21 cm Tube secured with: Tape Dental Injury: Teeth and Oropharynx as per pre-operative assessment

## 2017-10-02 NOTE — Interval H&P Note (Signed)
History and Physical Interval Note:  10/02/2017 7:14 AM  Peri Jefferson  has presented today for surgery, with the diagnosis of BILATERAL OVARIAN MASSES, ELEVATED CA125  The various methods of treatment have been discussed with the patient and family. After consideration of risks, benefits and other options for treatment, the patient has consented to  Procedure(s) with comments: HYSTERECTOMY ABDOMINAL WITH SALPINGO-OOPHORECTOMY (Bilateral) - bilateral salpingo-oophorectomy with possible total abdominal hysterectomy LAPAROTOMY WITH POSSIBLE STAGING (N/A) POSIBLE DEBULKING (N/A) as a surgical intervention .  The patient's history has been reviewed, patient examined, no change in status, stable for surgery.  I have reviewed the patient's chart and labs.  Questions were answered to the patient's satisfaction.   NOTE hysterectomy was discussed as a possible depending on frozen section and intraoperative findings.  Isabel Caprice

## 2017-10-02 NOTE — Anesthesia Postprocedure Evaluation (Signed)
Anesthesia Post Note  Patient: Jody Duke  Procedure(s) Performed: HYSTERECTOMY ABDOMINAL WITH SALPINGO-OOPHORECTOMY (Bilateral ) LAPAROTOMY WITH STAGING/OMENTECTOMY (N/A Abdomen) DEBULKING (N/A Abdomen)     Patient location during evaluation: PACU Anesthesia Type: General Level of consciousness: sedated Pain management: pain level controlled Vital Signs Assessment: post-procedure vital signs reviewed and stable Respiratory status: spontaneous breathing and respiratory function stable Cardiovascular status: stable Postop Assessment: no apparent nausea or vomiting Anesthetic complications: no    Last Vitals:  Vitals:   10/02/17 1403 10/02/17 1504  BP: 133/72 125/68  Pulse: 84 73  Resp: 15 16  Temp: (!) 36.3 C 36.8 C  SpO2: 100% 98%    Last Pain:  Vitals:   10/02/17 1504  TempSrc: Oral  PainSc:                  Tennyson Wacha DANIEL

## 2017-10-02 NOTE — Transfer of Care (Signed)
Immediate Anesthesia Transfer of Care Note  Patient: Jody Duke  Procedure(s) Performed: HYSTERECTOMY ABDOMINAL WITH SALPINGO-OOPHORECTOMY (Bilateral ) LAPAROTOMY WITH STAGING/OMENTECTOMY (N/A Abdomen) DEBULKING (N/A Abdomen)  Patient Location: PACU  Anesthesia Type:General  Level of Consciousness: awake, alert , oriented and patient cooperative  Airway & Oxygen Therapy: Patient Spontanous Breathing and Patient connected to face mask oxygen  Post-op Assessment: Report given to RN and Post -op Vital signs reviewed and stable  Post vital signs: Reviewed and stable  Last Vitals:  Vitals Value Taken Time  BP 125/80 10/02/2017 11:49 AM  Temp    Pulse 103 10/02/2017 11:53 AM  Resp 14 10/02/2017 11:53 AM  SpO2 98 % 10/02/2017 11:53 AM  Vitals shown include unvalidated device data.  Last Pain:  Vitals:   10/02/17 0620  TempSrc:   PainSc: 0-No pain         Complications: No apparent anesthesia complications

## 2017-10-03 LAB — BASIC METABOLIC PANEL
Anion gap: 7 (ref 5–15)
BUN: 15 mg/dL (ref 6–20)
CO2: 23 mmol/L (ref 22–32)
Calcium: 8.2 mg/dL — ABNORMAL LOW (ref 8.9–10.3)
Chloride: 104 mmol/L (ref 101–111)
Creatinine, Ser: 0.8 mg/dL (ref 0.44–1.00)
GFR calc Af Amer: >60 mL/min (ref >60)
GFR calc non Af Amer: >60 mL/min (ref >60)
Glucose, Bld: 207 mg/dL — ABNORMAL HIGH (ref 65–99)
Potassium: 4.1 mmol/L (ref 3.5–5.1)
Sodium: 134 mmol/L — ABNORMAL LOW (ref 135–145)

## 2017-10-03 LAB — CBC
HCT: 31.4 % — ABNORMAL LOW (ref 36.0–46.0)
Hemoglobin: 10.3 g/dL — ABNORMAL LOW (ref 12.0–15.0)
MCH: 31.1 pg (ref 26.0–34.0)
MCHC: 32.8 g/dL (ref 30.0–36.0)
MCV: 94.9 fL (ref 78.0–100.0)
Platelets: 250 10*3/uL (ref 150–400)
RBC: 3.31 MIL/uL — ABNORMAL LOW (ref 3.87–5.11)
RDW: 12.6 % (ref 11.5–15.5)
WBC: 14.1 10*3/uL — ABNORMAL HIGH (ref 4.0–10.5)

## 2017-10-03 LAB — GLUCOSE, CAPILLARY
Glucose-Capillary: 168 mg/dL — ABNORMAL HIGH (ref 65–99)
Glucose-Capillary: 157 mg/dL — ABNORMAL HIGH (ref 65–99)
Glucose-Capillary: 185 mg/dL — ABNORMAL HIGH (ref 65–99)
Glucose-Capillary: 268 mg/dL — ABNORMAL HIGH (ref 65–99)

## 2017-10-03 NOTE — Progress Notes (Signed)
1 Day Post-Op Procedure(s) (LRB): HYSTERECTOMY ABDOMINAL WITH SALPINGO-OOPHORECTOMY (Bilateral) LAPAROTOMY WITH STAGING/OMENTECTOMY (N/A) DEBULKING (N/A)  Subjective: Patient reports moderate upper abdominal incisional pain with coughing and deep inspiration.  Reporting cramping after eating solid food this am.  No nausea or emesis reported.  Pain controlled with PRN medications.  Reported significant pain when getting out of the bed last pm but improved with the second attempt of getting up.  Due to void since foley removal.  No concerns voiced.  Denies chest pain, dyspnea, passing flatus, having a bowel movement.  Objective: Vital signs in last 24 hours: Temp:  [97.4 F (36.3 C)-100.1 F (37.8 C)] 98.6 F (37 C) (04/05 0506) Pulse Rate:  [73-103] 78 (04/05 0506) Resp:  [15-17] 16 (04/05 0506) BP: (101-133)/(51-81) 102/51 (04/05 0506) SpO2:  [96 %-100 %] 98 % (04/05 0506) Last BM Date: 10/01/17  Intake/Output from previous day: 04/04 0701 - 04/05 0700 In: 4771.7 [P.O.:260; I.V.:4411.7; IV Piggyback:100] Out: 1700 [Urine:1550; Blood:150]  Physical Examination: General: alert, cooperative and no distress Resp: clear to auscultation bilaterally Cardio: regular rate and rhythm, S1, S2 normal, no murmur, click, rub or gallop GI: incision: midline incision with dry dressing in place and abdomen obese, active bowel sounds, tender with palpation, soft Extremities: extremities normal, atraumatic, no cyanosis or edema  Labs: WBC/Hgb/Hct/Plts:  14.1/10.3/31.4/250 (04/05 0440) BUN/Cr/glu/ALT/AST/amyl/lip:  15/0.80/--/--/--/--/-- (04/05 0440)  Assessment: 47 y.o. s/p Procedure(s): HYSTERECTOMY ABDOMINAL WITH SALPINGO-OOPHORECTOMY LAPAROTOMY WITH STAGING/OMENTECTOMY DEBULKING: stable Pain:  Pain is well-controlled on PRN medications.  Heme: Hgb 10.3 and Hct 31.4 this am.  Stable post-op.  CV: BP and HR stable post-op.  Continue to monitor.  GI:  Tolerating po: Yes. Antiemetics  ordered PRN.  GU: Due to void since foley removal.  Adequate output reported.    FEN: Stable post-op.  Prophylaxis: SCDs and lovenox post-op  Plan: Saline lock IV Kpad for symptom relief Abdominal binder Encourage IS use, deep breathing, and coughing Continue plan of care per Dr. Denman George   LOS: 1 day    Demone Lyles D Elwin Tsou 10/03/2017, 8:58 AM

## 2017-10-03 NOTE — Progress Notes (Signed)
Applied abdominal binder to pt but pt said it hurt and she did not like it. Will attempt again later if pt requests.

## 2017-10-04 LAB — GLUCOSE, CAPILLARY
Glucose-Capillary: 140 mg/dL — ABNORMAL HIGH (ref 65–99)
Glucose-Capillary: 129 mg/dL — ABNORMAL HIGH (ref 65–99)
Glucose-Capillary: 115 mg/dL — ABNORMAL HIGH (ref 65–99)
Glucose-Capillary: 168 mg/dL — ABNORMAL HIGH (ref 65–99)

## 2017-10-04 MED ORDER — BISACODYL 10 MG RE SUPP
10.0000 mg | Freq: Once | RECTAL | Status: AC
  Administered 2017-10-04: 10 mg via RECTAL
  Filled 2017-10-04: qty 1

## 2017-10-04 NOTE — Progress Notes (Signed)
2 Days Post-Op Procedure(s) (LRB): HYSTERECTOMY ABDOMINAL WITH SALPINGO-OOPHORECTOMY (Bilateral) LAPAROTOMY WITH STAGING/OMENTECTOMY (N/A) DEBULKING (N/A)  Subjective: Patient reports gas pains. No flatus no BM. No nausea or emesis. Minimal belching. Incisional pain improved.  Objective: Vital signs in last 24 hours: Temp:  [98.6 F (37 C)-99.1 F (37.3 C)] 98.6 F (37 C) (04/06 0554) Pulse Rate:  [91-103] 91 (04/06 0554) Resp:  [16-18] 18 (04/06 0554) BP: (104-140)/(59-82) 140/82 (04/06 0554) SpO2:  [96 %-98 %] 96 % (04/06 0554) Last BM Date: 10/01/17  Intake/Output from previous day: 04/05 0701 - 04/06 0700 In: 870 [P.O.:720; I.V.:150] Out: 1400 [Urine:1400]  Physical Examination: General: alert, cooperative and no distress Resp: clear to auscultation bilaterally Cardio: regular rate and rhythm, S1, S2 normal, no murmur, click, rub or gallop GI: incision: clean, dry, intact and dressing removed and mildly distended abdomen Extremities: extremities normal, atraumatic, no cyanosis or edema  Labs:      CBC    Component Value Date/Time   WBC 14.1 (H) 10/03/2017 0440   RBC 3.31 (L) 10/03/2017 0440   HGB 10.3 (L) 10/03/2017 0440   HGB 12.9 06/16/2017 1729   HCT 31.4 (L) 10/03/2017 0440   HCT 37.5 06/16/2017 1729   PLT 250 10/03/2017 0440   PLT 371 06/16/2017 1729   MCV 94.9 10/03/2017 0440   MCV 92 06/16/2017 1729   MCH 31.1 10/03/2017 0440   MCHC 32.8 10/03/2017 0440   RDW 12.6 10/03/2017 0440   RDW 13.4 06/16/2017 1729   LYMPHSABS 3,718 08/13/2017 1450   LYMPHSABS 3.7 (H) 06/16/2017 1729   EOSABS 216 08/13/2017 1450   EOSABS 0.1 06/16/2017 1729   BASOSABS 82 08/13/2017 1450   BASOSABS 0.1 06/16/2017 1729   BMET    Component Value Date/Time   NA 134 (L) 10/03/2017 0440   NA 141 06/16/2017 1729   K 4.1 10/03/2017 0440   CL 104 10/03/2017 0440   CO2 23 10/03/2017 0440   GLUCOSE 207 (H) 10/03/2017 0440   BUN 15 10/03/2017 0440   BUN 14 06/16/2017 1729    CREATININE 0.80 10/03/2017 0440   CALCIUM 8.2 (L) 10/03/2017 0440   GFRNONAA >60 10/03/2017 0440   GFRAA >60 10/03/2017 0440     Assessment: 47 y.o. s/p Procedure(s): HYSTERECTOMY ABDOMINAL WITH SALPINGO-OOPHORECTOMY LAPAROTOMY WITH STAGING/OMENTECTOMY DEBULKING: stable Pain:  Pain is well-controlled on PRN medications.  Heme: Hgb 10.3 and Hct 31.4 on POD1.  Stable post-op.  CV: BP and HR stable post-op.  Continue to monitor.  GI:  Tolerating po: Yes. Antiemetics ordered PRN. Suppository for assistance with gas pains  GU: voiding  FEN: Stable post-op.  Prophylaxis: SCDs and lovenox post-op  Plan: Encouraged diet prn and chewing gum Encouraged ambulation Will try suppository for gas relief Anticipate discharge tomorrow.    LOS: 2 days    Thereasa Solo 10/04/2017, 10:14 AM

## 2017-10-05 LAB — GLUCOSE, CAPILLARY
Glucose-Capillary: 122 mg/dL — ABNORMAL HIGH (ref 65–99)
Glucose-Capillary: 132 mg/dL — ABNORMAL HIGH (ref 65–99)
Glucose-Capillary: 134 mg/dL — ABNORMAL HIGH (ref 65–99)
Glucose-Capillary: 168 mg/dL — ABNORMAL HIGH (ref 65–99)

## 2017-10-05 NOTE — Progress Notes (Signed)
3 Days Post-Op Procedure(s) (LRB): HYSTERECTOMY ABDOMINAL WITH SALPINGO-OOPHORECTOMY (Bilateral) LAPAROTOMY WITH STAGING/OMENTECTOMY (N/A) DEBULKING (N/A)  Subjective: Patient reports gas pains. No flatus no BM yet including with suppository. mild nausea but no emesis. Minimal belching. Incisional pain improved. Does not feel comfortable with discharge while abdomen still gassy and full.   Objective: Vital signs in last 24 hours: Temp:  [98.4 F (36.9 C)-98.5 F (36.9 C)] 98.4 F (36.9 C) (04/07 0552) Pulse Rate:  [73-84] 73 (04/07 0552) Resp:  [18] 18 (04/07 0552) BP: (103-113)/(58-67) 109/67 (04/07 0552) SpO2:  [96 %] 96 % (04/07 0552) Last BM Date: 10/01/17  Intake/Output from previous day: 04/06 0701 - 04/07 0700 In: 480 [P.O.:480] Out: 1500 [Urine:1500]  Physical Examination: General: alert, cooperative and no distress Resp: clear to auscultation bilaterally Cardio: regular rate and rhythm, S1, S2 normal, no murmur, click, rub or gallop GI: incision: clean, dry, intact and dressing off with steristrips on and abdomen softly distended, tympanytic to percuss, bowel sounds present.  Extremities: extremities normal, atraumatic, no cyanosis or edema  Labs:      CBC    Component Value Date/Time   WBC 14.1 (H) 10/03/2017 0440   RBC 3.31 (L) 10/03/2017 0440   HGB 10.3 (L) 10/03/2017 0440   HGB 12.9 06/16/2017 1729   HCT 31.4 (L) 10/03/2017 0440   HCT 37.5 06/16/2017 1729   PLT 250 10/03/2017 0440   PLT 371 06/16/2017 1729   MCV 94.9 10/03/2017 0440   MCV 92 06/16/2017 1729   MCH 31.1 10/03/2017 0440   MCHC 32.8 10/03/2017 0440   RDW 12.6 10/03/2017 0440   RDW 13.4 06/16/2017 1729   LYMPHSABS 3,718 08/13/2017 1450   LYMPHSABS 3.7 (H) 06/16/2017 1729   EOSABS 216 08/13/2017 1450   EOSABS 0.1 06/16/2017 1729   BASOSABS 82 08/13/2017 1450   BASOSABS 0.1 06/16/2017 1729   BMET    Component Value Date/Time   NA 134 (L) 10/03/2017 0440   NA 141 06/16/2017 1729    K 4.1 10/03/2017 0440   CL 104 10/03/2017 0440   CO2 23 10/03/2017 0440   GLUCOSE 207 (H) 10/03/2017 0440   BUN 15 10/03/2017 0440   BUN 14 06/16/2017 1729   CREATININE 0.80 10/03/2017 0440   CALCIUM 8.2 (L) 10/03/2017 0440   GFRNONAA >60 10/03/2017 0440   GFRAA >60 10/03/2017 0440     Assessment: 47 y.o. s/p Procedure(s): HYSTERECTOMY ABDOMINAL WITH SALPINGO-OOPHORECTOMY LAPAROTOMY WITH STAGING/OMENTECTOMY DEBULKING: stable Pain:  Pain is well-controlled on PRN medications.  Heme: Hgb 10.3 and Hct 31.4 on POD1.  Stable post-op.  CV: BP and HR stable post-op.  Continue to monitor.  GI:  Tolerating po: Yes. Antiemetics ordered PRN. Counseled regarding strategies to improve return of bowel function.   GU: voiding  FEN: Stable post-op.  Prophylaxis: SCDs and lovenox post-op  Plan: Encouraged diet prn and chewing gum Encouraged ambulation Anticipate discharge tomorrow if better return of GI function.    LOS: 3 days    Jody Duke 10/05/2017, 11:05 AM

## 2017-10-06 ENCOUNTER — Encounter (HOSPITAL_COMMUNITY): Payer: Self-pay | Admitting: Obstetrics

## 2017-10-06 LAB — GLUCOSE, CAPILLARY
Glucose-Capillary: 119 mg/dL — ABNORMAL HIGH (ref 65–99)
Glucose-Capillary: 138 mg/dL — ABNORMAL HIGH (ref 65–99)

## 2017-10-06 NOTE — Discharge Summary (Addendum)
Physician Discharge Summary  Patient ID: Jody Duke MRN: [id number] DOB/AGE: 1970-07-03 47 y.o.  Admit date: 10/02/2017 Discharge date: 10/06/2017  Admission Diagnoses: Pelvic mass in female  Discharge Diagnoses:  Principal Problem:   Pelvic mass in female   Discharged Condition:  The patient is in good condition and stable for discharge.    Hospital Course: On 10/02/2017, the patient underwent the following: Procedure(s): HYSTERECTOMY ABDOMINAL WITH SALPINGO-OOPHORECTOMY LAPAROTOMY WITH STAGING/OMENTECTOMY DEBULKING.  The postoperative course was uneventful except for slow return of bowel function.  She was discharged to home on postoperative day 4 tolerating a regular diet, passing flatus, ambulating, voiding, pain controlled with oral medications.  Consults: None  Significant Diagnostic Studies: None  Treatments: surgery: see above  Discharge Exam: Blood pressure 113/64, pulse 85, temperature 98.6 F (37 C), temperature source Oral, resp. rate 16, height '5\' 7"'$  (1.702 m), weight 217 lb (98.4 kg), last menstrual period 09/21/2017, SpO2 97 %. General appearance: alert, cooperative and no distress Resp: clear to auscultation bilaterally Cardio: regular rate and rhythm, S1, S2 normal, no murmur, click, rub or gallop GI: soft, non-tender; bowel sounds normal; no masses,  no organomegaly Extremities: extremities normal, atraumatic, no cyanosis or edema Incision/Wound: Midline incision with dry dressing  Disposition: Discharge disposition: 01-Home or Self Care       Discharge Instructions    Call MD for:  difficulty breathing, headache or visual disturbances   Complete by:  As directed    Call MD for:  extreme fatigue   Complete by:  As directed    Call MD for:  hives   Complete by:  As directed    Call MD for:  persistant dizziness or light-headedness   Complete by:  As directed    Call MD for:  persistant nausea and vomiting   Complete by:  As directed    Call MD for:   redness, tenderness, or signs of infection (pain, swelling, redness, odor or green/yellow discharge around incision site)   Complete by:  As directed    Call MD for:  severe uncontrolled pain   Complete by:  As directed    Call MD for:  temperature >100.4   Complete by:  As directed    Diet - low sodium heart healthy   Complete by:  As directed    Driving Restrictions   Complete by:  As directed    No driving for 2 weeks from surgery.  Do not take narcotics and drive.   Increase activity slowly   Complete by:  As directed    Lifting restrictions   Complete by:  As directed    No lifting greater than 10 lbs.   Sexual Activity Restrictions   Complete by:  As directed    No sexual activity, nothing in the vagina, for 6 weeks.     Allergies as of 10/06/2017      Reactions   Percocet [oxycodone-acetaminophen] Nausea And Vomiting      Medication List    TAKE these medications   amoxicillin 500 MG capsule Commonly known as:  AMOXIL Take 500 mg by mouth 4 (four) times daily.   atorvastatin 10 MG tablet Commonly known as:  LIPITOR Take 1 tablet (10 mg total) by mouth daily. What changed:  when to take this   glucose blood test strip Check sugar once daily  Dx: DMII controlled, non-insulin, without complications   glucose monitoring kit monitoring kit 1 each by Does not apply route as needed for other. Dx:  DMII non-insulin dependent without complications; controlled;   HYDROcodone-acetaminophen 5-325 MG tablet Commonly known as:  NORCO/VICODIN Take 1 tablet by mouth every 4 (four) hours as needed for moderate pain (as needed postop pain. Use pills from dentist before using this.).   ibuprofen 800 MG tablet Commonly known as:  ADVIL,MOTRIN Take 800 mg by mouth every 8 (eight) hours as needed for fever, headache, mild pain, moderate pain or cramping.   Lancets Misc Check sugar once daily dx DMII controlled, non-insulin, without complications   meloxicam 15 MG tablet Commonly  known as:  MOBIC Take 15 mg by mouth daily as needed (for pain/inflammation.).   metFORMIN 500 MG tablet Commonly known as:  GLUCOPHAGE Take 1 tablet (500 mg total) by mouth daily. What changed:  when to take this   traMADol 50 MG tablet Commonly known as:  ULTRAM Take 1 tablet (50 mg total) by mouth every 8 (eight) hours as needed.        Greater than thirty minutes were spend for face to face discharge instructions and discharge orders/summary in EPIC.   Signed: Dorothyann Gibbs 10/06/2017, 12:50 PM

## 2017-10-06 NOTE — Discharge Instructions (Signed)
10/06/2017  Return to work: 6-8 weeks if applicable  Activity: 1. Be up and out of the bed during the day.  Take a nap if needed.  You may walk up steps but be careful and use the hand rail.  Stair climbing will tire you more than you think, you may need to stop part way and rest.   2. No lifting or straining for 6 weeks.  3. No driving for 2 week(s) from surgery.  Do not drive if you are taking narcotic pain medicine.  4. Shower daily.  Use soap and water on your incision and pat dry; don't rub.  No tub baths until cleared by your surgeon.   5. No sexual activity and nothing in the vagina for 6 weeks.  6. You may experience a small amount of clear drainage from your incision, which is normal.  If the drainage persists or increases, please call the office.  7. You may experience vaginal spotting after surgery or around the 6-8 week mark from surgery when the stitches at the top of the vagina begin to dissolve.  The spotting is normal but if you experience heavy bleeding, call our office.  8. Take Tylenol or ibuprofen first for pain and only use Vicodin for severe pain not relieved by the Tylenol or Ibuprofen.  Monitor your Tylenol intake to a max of 4,000 mg a day since Vicodin has Tylenol in it as well.  Diet: 1. Low sodium Heart Healthy Diet is recommended.  2. It is safe to use a laxative, such as Miralax or Colace, if you have difficulty moving your bowels. You can take Sennakot at bedtime every evening to keep bowel movements regular and to prevent constipation.    Wound Care: 1. Keep clean and dry.  Shower daily.  Reasons to call the Doctor:  Fever - Oral temperature greater than 100.4 degrees Fahrenheit  Foul-smelling vaginal discharge  Difficulty urinating  Nausea and vomiting  Increased pain at the site of the incision that is unrelieved with pain medicine.  Difficulty breathing with or without chest pain  New calf pain especially if only on one side  Sudden,  continuing increased vaginal bleeding with or without clots.   Contacts: For questions or concerns you should contact:  Dr. Precious Haws at [place], [place] at [phone]  After Hours: call [phone] and have the GYN Oncologist paged/contacted

## 2017-10-06 NOTE — Progress Notes (Signed)
4 Days Post-Op Procedure(s) (LRB): HYSTERECTOMY ABDOMINAL WITH SALPINGO-OOPHORECTOMY (Bilateral) LAPAROTOMY WITH STAGING/OMENTECTOMY (N/A) DEBULKING (N/A)  Subjective: Patient reports No flatus yet. Feels like it's close. Tolerating diet. Nausea improved compared to weekend.  tolerating PO.  Incision burning.  Objective: Vital signs in last 24 hours: Temp:  [98.6 F (37 C)-99.2 F (37.3 C)] 98.6 F (37 C) (04/08 0526) Pulse Rate:  [72-85] 85 (04/08 0526) Resp:  [16] 16 (04/08 0526) BP: (109-113)/(58-64) 113/64 (04/08 0526) SpO2:  [97 %-98 %] 97 % (04/08 0526) Last BM Date: 10/01/17  Intake/Output from previous day: 04/07 0701 - 04/08 0700 In: 849 [P.O.:849] Out: 1650 [Urine:1650]  Physical Examination: General: no distress  CV RRR, no murmur Lungs CTA Abdo - wound CDI. + NABS, soft, NT,ND Ext - no edema, NT        Assessment:  47 y.o. s/p Procedure(s): HYSTERECTOMY ABDOMINAL WITH SALPINGO-OOPHORECTOMY LAPAROTOMY WITH STAGING/OMENTECTOMY DEBULKING: stable Pain:  Pain is well-controlled on  medications.  GI:  Tolerating po: Yes   Pt with ileus.   Plan: Encourage ambulation Dispo:  Discharge plan to include :consults: @CM @, Social Work The patient is to be discharged to home.  Once passes flatus may dc home. Postop instructions discussed  Discussed washings. Awaiting final pathology.   LOS: 4 days    Jody Duke 10/06/2017, 10:44 AM

## 2017-10-08 ENCOUNTER — Encounter: Payer: Self-pay | Admitting: Obstetrics

## 2017-10-10 ENCOUNTER — Encounter: Payer: Self-pay | Admitting: Obstetrics

## 2017-10-13 ENCOUNTER — Encounter: Payer: Self-pay | Admitting: Obstetrics

## 2017-10-13 ENCOUNTER — Inpatient Hospital Stay (HOSPITAL_BASED_OUTPATIENT_CLINIC_OR_DEPARTMENT_OTHER): Payer: 59 | Admitting: Obstetrics

## 2017-10-13 VITALS — BP 136/71 | HR 86 | Temp 98.3°F | Ht 67.0 in | Wt 211.9 lb

## 2017-10-13 DIAGNOSIS — N83202 Unspecified ovarian cyst, left side: Secondary | ICD-10-CM

## 2017-10-13 DIAGNOSIS — N83201 Unspecified ovarian cyst, right side: Secondary | ICD-10-CM

## 2017-10-13 DIAGNOSIS — R197 Diarrhea, unspecified: Secondary | ICD-10-CM

## 2017-10-13 MED ORDER — HYDROCODONE-ACETAMINOPHEN 5-325 MG PO TABS: 1.0000 | ORAL_TABLET | ORAL | 0 refills | Status: DC | PRN

## 2017-10-13 NOTE — Patient Instructions (Addendum)
1. You were given a copy of your operative report today. You may access the pathology report in your portal. I recommend you print a copy of this. 2. Return in one month for a vaginal cuff check. 3. Refill #12 of pain medication. 4. Activity restrictions discussed. 5. Cdiff test will be ordered on your stool, given the diarrhea

## 2017-10-13 NOTE — Progress Notes (Signed)
  HPI:  Jody Duke is a 47 y.o. year old initially seen in consultation on 09/29/2017 for bilateral adnexal masses.  She then underwent a laparotomy, TAH, BSO, staging on 02/02/4626 without complications.  Her postoperative course was uncomplicated.  Frozen section pathology was suspicious for borderline tumor.  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  Uterus and cervix were normal no evidence of dysplasia 1 lymph node showed in the endosalpingosis.  She is seen today for a postoperative check and to discuss her pathology results and ongoing plan.  Since discharge from the hospital, she is feeling overall improved.  Still occasional postoperative incisional pain.  She is noting several episodes of diarrhea for the past couple of days.  Occasional nausea.  Also significant hot flashes and night sweats since undergoing surgical menopause.    Review of systems: Review of Systems  Constitutional: Positive for chills and diaphoresis.  HENT: Negative.   Eyes: Negative.   Respiratory: Negative.   Cardiovascular: Negative.   Gastrointestinal: Positive for diarrhea and nausea.  Genitourinary: Positive for urgency.  Musculoskeletal: Negative.   Skin: Negative.   Neurological: Positive for dizziness.  Endo/Heme/Allergies: Negative.   Psychiatric/Behavioral: Negative.   GYN - denies bleeding. Denies vaginal drainage/discharge.  Physical Exam: Blood pressure 136/71, pulse 86, temperature 98.3 F (36.8 C), temperature source Oral, height 5\' 7"  (1.702 m), weight 211 lb 14.4 oz (96.1 kg), last menstrual period 09/21/2017, SpO2 100 %.  ECOG PERFORMANCE STATUS: 1 - Symptomatic but completely ambulatory   General :  Well developed, 47 y.o., female in no apparent distress HEENT:  Normocephalic/atraumatic, symmetric, EOMI, eyelids normal Neck:   No visible masses.  Respiratory:  Respirations unlabored, no use of  accessory muscles CV:   Deferred Breast:  Deferred Musculoskeletal: Normal muscle strength. Abdomen:  Soft nontender nondistended.  Incision is healing mild erythema at the umbilicus appears to be normal eschar.  Mild erythema at the inferior margin with no drainage no warmth no significant induration.  No visible masses or protrusion Extremities:  No visible edema or deformities Skin:   Normal inspection Neuro/Psych:  No focal motor deficit, no abnormal mental status. Normal gait. Normal affect. 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   S/p exporter laparotomy, TAH, BSO staging on 10/02/2017.   Plan: 1) Pathology reports reviewed today 2) Treatment counseling -we reviewed that that since this is not malignancy no additional treatment will be recommended She was given the opportunity to ask questions, which were answered to her satisfaction, and she is agreement with the above mentioned plan of care.  3)  Return to clinic partially 1 month 4) refill on medication was given number equal to 12 5) diarrhea we will check C. difficile

## 2017-10-14 ENCOUNTER — Encounter: Payer: Self-pay | Admitting: Obstetrics

## 2017-10-15 DIAGNOSIS — R2242 Localized swelling, mass and lump, left lower limb: Secondary | ICD-10-CM | POA: Diagnosis not present

## 2017-10-15 DIAGNOSIS — M85872 Other specified disorders of bone density and structure, left ankle and foot: Secondary | ICD-10-CM | POA: Diagnosis not present

## 2017-10-15 DIAGNOSIS — M25572 Pain in left ankle and joints of left foot: Secondary | ICD-10-CM | POA: Diagnosis not present

## 2017-10-15 DIAGNOSIS — G8929 Other chronic pain: Secondary | ICD-10-CM | POA: Diagnosis not present

## 2017-10-15 HISTORY — PX: ANKLE SURGERY: SHX546

## 2017-10-16 ENCOUNTER — Encounter: Payer: Self-pay | Admitting: Gynecologic Oncology

## 2017-10-21 DIAGNOSIS — R2242 Localized swelling, mass and lump, left lower limb: Secondary | ICD-10-CM | POA: Diagnosis not present

## 2017-10-27 ENCOUNTER — Ambulatory Visit (INDEPENDENT_AMBULATORY_CARE_PROVIDER_SITE_OTHER): Payer: 59 | Admitting: Family Medicine

## 2017-10-27 ENCOUNTER — Other Ambulatory Visit: Payer: Self-pay

## 2017-10-27 ENCOUNTER — Encounter: Payer: Self-pay | Admitting: Family Medicine

## 2017-10-27 VITALS — BP 108/70 | HR 113 | Temp 98.5°F | Ht 67.0 in

## 2017-10-27 DIAGNOSIS — E7800 Pure hypercholesterolemia, unspecified: Secondary | ICD-10-CM | POA: Diagnosis not present

## 2017-10-27 DIAGNOSIS — N921 Excessive and frequent menstruation with irregular cycle: Secondary | ICD-10-CM

## 2017-10-27 DIAGNOSIS — N83202 Unspecified ovarian cyst, left side: Secondary | ICD-10-CM | POA: Diagnosis not present

## 2017-10-27 DIAGNOSIS — N6310 Unspecified lump in the right breast, unspecified quadrant: Secondary | ICD-10-CM | POA: Diagnosis not present

## 2017-10-27 DIAGNOSIS — R3129 Other microscopic hematuria: Secondary | ICD-10-CM

## 2017-10-27 DIAGNOSIS — E119 Type 2 diabetes mellitus without complications: Secondary | ICD-10-CM

## 2017-10-27 DIAGNOSIS — N83201 Unspecified ovarian cyst, right side: Secondary | ICD-10-CM | POA: Diagnosis not present

## 2017-10-27 DIAGNOSIS — R2242 Localized swelling, mass and lump, left lower limb: Secondary | ICD-10-CM

## 2017-10-27 DIAGNOSIS — E8941 Symptomatic postprocedural ovarian failure: Secondary | ICD-10-CM | POA: Diagnosis not present

## 2017-10-27 DIAGNOSIS — Z720 Tobacco use: Secondary | ICD-10-CM | POA: Diagnosis not present

## 2017-10-27 LAB — GLUCOSE, POCT (MANUAL RESULT ENTRY): POC Glucose: 122 mg/dl — AB (ref 70–99)

## 2017-10-27 MED ORDER — GABAPENTIN 100 MG PO CAPS: 100.0000 mg | ORAL_CAPSULE | Freq: Every day | ORAL | 2 refills | Status: DC

## 2017-10-27 MED ORDER — VENLAFAXINE HCL ER 37.5 MG PO CP24: 37.5000 mg | ORAL_CAPSULE | Freq: Every day | ORAL | 2 refills | Status: DC

## 2017-10-27 NOTE — Patient Instructions (Addendum)
PERI-COLACE (SENAKOT-S) 1-2 TWICE DAILY FOR CONSTIPATION.      IF you received an x-ray today, you will receive an invoice from [facility]. Please contact [facility] at [phone] with questions or concerns regarding your invoice.   IF you received labwork today, you will receive an invoice from [facility]. Please contact [facility] at [phone] with questions or concerns regarding your invoice.   Our billing staff will not be able to assist you with questions regarding bills from these companies.  You will be contacted with the lab results as soon as they are available. The fastest way to get your results is to activate your My Chart account. Instructions are located on the last page of this paperwork. If you have not heard from [place] regarding the results in 2 weeks, please contact this office.      Constipation, Adult Constipation is when a person has fewer bowel movements in a week than normal, has difficulty having a bowel movement, or has stools that are dry, hard, or larger than normal. Constipation may be caused by an underlying condition. It may become worse with age if a person takes certain medicines and does not take in enough fluids. Follow these instructions at home: Eating and drinking   Eat foods that have a lot of fiber, such as fresh fruits and vegetables, whole grains, and beans.  Limit foods that are high in fat, low in fiber, or overly processed, such as french fries, hamburgers, cookies, candies, and soda.  Drink enough fluid to keep your urine clear or pale yellow. General instructions  Exercise regularly or as told by your health care provider.  Go to the restroom when you have the urge to go. Do not hold it in.  Take over-the-counter and prescription medicines only as told by your health care provider. These include any fiber supplements.  Practice pelvic floor retraining exercises, such as deep breathing while relaxing the lower abdomen  and pelvic floor relaxation during bowel movements.  Watch your condition for any changes.  Keep all follow-up visits as told by your health care provider. This is important. Contact a health care provider if:  You have pain that gets worse.  You have a fever.  You do not have a bowel movement after 4 days.  You vomit.  You are not hungry.  You lose weight.  You are bleeding from the anus.  You have thin, pencil-like stools. Get help right away if:  You have a fever and your symptoms suddenly get worse.  You leak stool or have blood in your stool.  Your abdomen is bloated.  You have severe pain in your abdomen.  You feel dizzy or you faint. This information is not intended to replace advice given to you by your health care provider. Make sure you discuss any questions you have with your health care provider. Document Released: 03/15/2004 Document Revised: 01/05/2016 Document Reviewed: 12/06/2015 Elsevier Interactive Patient Education  2018 [employer].

## 2017-10-27 NOTE — Progress Notes (Signed)
Subjective:    Patient ID: Jody Duke, female    DOB: [birth year], 47 y.o.   MRN: [id number]  10/27/2017  chronic conditions (4 month follow up)    HPI This 47 y.o. female presents for [place] of the following:  New onset diabetes mellitus type 2.  Educated on diagnosis and medical management.  Refer for diabetic education.  Prescription for glucometer with test strips and lancets provided.  Initiate metformin 500 mg once daily with largest meal of the day.   I recommend weight loss, exercise, and low-carbohydrate low-sugar food choices. You should AVOID: regular sodas, sweetened tea, fruit juices.  You should LIMIT: breads, pastas, rice, potatoes, and desserts/sweets.  I would recommend limiting your total carbohydrate intake per meal to 45 grams; I would limit your total carbohydrate intake per snack to 30 grams.  I would also have a goal of 60 grams of protein intake per day; this would equal 10-15 grams of protein per meal and 5-10 grams of protein per snack.  Status post Pneumovax.  Initiate atorvastatin 10 mg daily for hypercholesterolemia in a diabetic patient.  Left breast mass: Status post diagnostic mammogram with left breast ultrasound.  Status post breast biopsy consistent with phyllodes tumor.  Status post surgical biopsy excisional 3 days ago.  Pathology pending.  Agreeable to hydrocodone for pain control at bedtime.   Left ankle mass: Status post x-rays of left ankle and CT of left ankle.  Refer to local orthopedist who has referred patient to [facility] for surgical consultation.   Menometrorrhagia: Persistent.  Pap smear is normal.  Patient has upcoming appointment with gynecology for endometrial biopsy and pelvic ultrasound.  Blood pressure elevation: Improvement today yet borderline.  Recommend weight loss, exercise, low-sodium diet.  Microscopic hematuria: In a smoker.  Repeat today.  If persistent, will warrant urology consultation.  Right  breast mass:  Status post diagnostic mammogram and ultrasound.  Status post biopsy.  Pathology consistent with a fibroepithelial lesion.  Consistent with fibroadenoma.  Repeat diagnostic mammogram in 6 months recommended.  Tobacco abuse: Highly encourage smoking cessation with patient considering upcoming left ankle surgery and new diagnosis of diabetes mellitus type 2.  Patient pre-contemplative at this time.  Obesity with BMI of 33:  -recommend weight loss, exercise for 30-60 minutes five days per week; recommend 1200 kcal restriction per day with a minimum of 60 grams of protein per day.  Refer to nutritionist.   UPDATE: IMPRESSION OF CT ABDOMEN PELVIS 09/18/17: 1. Bilateral large adnexal masses inseparable from the ovaries. Appearance favors epithelial ovarian tumors. No current findings of metastatic spread. Typically in this scenario, resection is warranted. 2. Diffuse hepatic steatosis. 3.  Aortic Atherosclerosis (ICD10-I70.0). 4. 2 mm right mid kidney nonobstructive renal calculus.  S/P SONOHYSTEROGRAM ON 09/04/17: Ultrasound transvaginal and transabdominal shows uterus normal size and echotexture. Endometrial echo 4.4 mm. Right adnexa with 15 cm cystic multiloculated mass with positive flow. Solid area also noted at 54 x 50 mm. Left adnexa with multiloculated mass 10 cm with positive flow. Cul-de-sac negative.  Sonohysterogram performed, sterile technique, easy catheter introduction, good distention with no abnormalities visualized. Endometrial biopsy taken.   Ca 125 OF 825.  Referred to gyn-onc by Dr. Phineas Real.  LEFT BREAST SURGERIES January AND February 2019.  Follow-up in six months.  B Adnexal mass: with elevated CA125.    HYSTERECTOMY ON October 02, 2017.  No malignancy.  Post-op follow-up on 11/17/17.  LEFT MASS ANKLE SURGICAL EXCISION ON October 15, 2017.     Menopause is horrible; hot flashes and mood flashes.  Partner takes Gabapentin twice daily.  Nighttime  wakening.    Lightheaded and dizzy.  No bleeding.  No iron supplement.  Going from couch to bathroom.  Onset after hysterectomy.  Checking sugars; 115-160.  Metformin one daily.  Hemoglobin A1c in 09/26/17 of 6.6.    Constipation: taking hydrocodone for pain; having horrible constipation; taking hydrocodone qhs.  Had to get cast change.   BP Readings from Last 3 Encounters:  11/17/17 140/76  10/27/17 108/70  10/13/17 136/71   Wt Readings from Last 3 Encounters:  10/13/17 211 lb 14.4 oz (96.1 kg)  10/02/17 217 lb (98.4 kg)  09/29/17 217 lb 1.6 oz (98.5 kg)   Immunization History  Administered Date(s) Administered  . Influenza,inj,Quad PF,6+ Mos 02/27/2017  . Influenza-Unspecified 03/31/2017  . Pneumococcal Polysaccharide-23 07/28/2017  . Tdap 06/16/2017    Review of Systems  Constitutional: Positive for diaphoresis. Negative for chills, fatigue and fever.  Eyes: Negative for visual disturbance.  Respiratory: Negative for cough and shortness of breath.   Cardiovascular: Negative for chest pain, palpitations and leg swelling.  Gastrointestinal: Positive for constipation. Negative for abdominal pain, diarrhea, nausea and vomiting.  Endocrine: Negative for cold intolerance, heat intolerance, polydipsia, polyphagia and polyuria.  Musculoskeletal: Positive for arthralgias, gait problem and joint swelling.  Neurological: Positive for dizziness and light-headedness. Negative for tremors, seizures, syncope, facial asymmetry, speech difficulty, weakness, numbness and headaches.    Past Medical History:  Diagnosis Date  . Breast mass, left 07/2017  . Diabetes mellitus without complication ([place])   . Migraines    Past Surgical History:  Procedure Laterality Date  . BREAST BIOPSY Left 07/24/2017   Procedure: LEFT BREAST MASS EXCISIONAL BIOPSY;  Surgeon: Rolm Bookbinder, MD;  Location: [facility];  Service: General;  Laterality: Left;  . DEBULKING N/A 10/02/2017    Procedure: DEBULKING;  Surgeon: Isabel Caprice, MD;  Location: [facility];  Service: Gynecology;  Laterality: N/A;  . FOOT GANGLION EXCISION Left    age 43  . HYSTERECTOMY ABDOMINAL WITH SALPINGO-OOPHORECTOMY Bilateral 10/02/2017   Procedure: HYSTERECTOMY ABDOMINAL WITH SALPINGO-OOPHORECTOMY;  Surgeon: Isabel Caprice, MD;  Location: [facility];  Service: Gynecology;  Laterality: Bilateral;  bilateral salpingo-oophorectomy with possible total abdominal hysterectomy  . LAPAROTOMY WITH STAGING N/A 10/02/2017   Procedure: LAPAROTOMY WITH STAGING/OMENTECTOMY;  Surgeon: Isabel Caprice, MD;  Location: [facility];  Service: Gynecology;  Laterality: N/A;  . RE-EXCISION OF BREAST LUMPECTOMY Left 08/25/2017   Procedure: LEFT RE-EXCISION OF BREAST MARGINS ERAS PATHWAY;  Surgeon: Rolm Bookbinder, MD;  Location: [facility];  Service: General;  Laterality: Left;   Allergies  Allergen Reactions  . Percocet [Oxycodone-Acetaminophen] Nausea And Vomiting   Current Outpatient Medications on File Prior to Visit  Medication Sig Dispense Refill  . glucose blood test strip Check sugar once daily  Dx: DMII controlled, non-insulin, without complications 330 each 12  . glucose monitoring kit (FREESTYLE) monitoring kit 1 each by Does not apply route as needed for other. Dx: DMII non-insulin dependent without complications; controlled; 1 each 0  . HYDROcodone-acetaminophen (NORCO) 10-325 MG tablet Take 1 tablet by mouth every 6 (six) hours as needed. for pain  0  . ibuprofen (ADVIL,MOTRIN) 800 MG tablet Take 800 mg by mouth every 8 (eight) hours as needed for fever, headache, mild pain, moderate pain or cramping.    . Lancets MISC Check sugar once daily dx DMII controlled, non-insulin,  without complications 878 each 11  . ondansetron (ZOFRAN) 4 MG tablet Take 4 mg by mouth every 8 (eight) hours as needed for nausea or vomiting.     No current facility-administered medications on file prior to visit.    Social  History   Socioeconomic History  . Marital status: Single    Spouse name: Not on file  . Number of children: Not on file  . Years of education: Not on file  . Highest education level: Not on file  Occupational History  . Not on file  Social Needs  . Financial resource strain: Not on file  . Food insecurity:    Worry: Not on file    Inability: Not on file  . Transportation needs:    Medical: Not on file    Non-medical: Not on file  Tobacco Use  . Smoking status: Current Every Day Smoker    Packs/day: 0.00    Years: 30.00    Pack years: 0.00    Types: Cigarettes  . Smokeless tobacco: Never Used  . Tobacco comment: 1 daily  Substance and Sexual Activity  . Alcohol use: No    Frequency: Never  . Drug use: No  . Sexual activity: Not Currently    Partners: Female    Comment: 1st intercourse 47 yo-More than 5 partners  Lifestyle  . Physical activity:    Days per week: Not on file    Minutes per session: Not on file  . Stress: Not on file  Relationships  . Social connections:    Talks on phone: Not on file    Gets together: Not on file    Attends religious service: Not on file    Active member of club or organization: Not on file    Attends meetings of clubs or organizations: Not on file    Relationship status: Not on file  . Intimate partner violence:    Fear of current or ex partner: Not on file    Emotionally abused: Not on file    Physically abused: Not on file    Forced sexual activity: Not on file  Other Topics Concern  . Not on file  Social History Narrative   Marital status: single; girlfriend x 3 years; from [place]; moved [place] in 2015      Children: none      Lives: with girlfriend      Employment: works at [employer]; runs seasonal department      Tobacco: 1.5ppd x age 51.  Never quit.      Alcohol: None; rare      Drugs: week as teenager      Exercise: none      Sexual activity: females since 21; males prior.  One STD/Gonorrhea.   Family History    Problem Relation Age of Onset  . Hyperlipidemia Mother   . Alcohol abuse Mother   . Cirrhosis Mother   . Diabetes Father   . Hyperlipidemia Father   . Hypertension Father   . Stroke Father   . Heart disease Father 50       CAD with stenting multiple  . Hyperlipidemia Brother   . Hypertension Brother   . Breast cancer Maternal Grandmother        Post menopausal  . Pancreatic cancer Paternal Aunt   . Pancreatic cancer Paternal Aunt        Objective:    BP 108/70 (BP Location: Right Arm)   Pulse (!) 113   Temp 98.5  F (36.9 C) (Oral)   Ht '5\' 7"'$  (1.702 m)   LMP 09/21/2017 (Exact Date)   SpO2 97%   BMI 33.19 kg/m  Physical Exam  Constitutional: She is oriented to person, place, and time. She appears well-developed and well-nourished. No distress.  HENT:  Head: Normocephalic and atraumatic.  Right Ear: External ear normal.  Left Ear: External ear normal.  Nose: Nose normal.  Mouth/Throat: Oropharynx is clear and moist.  Eyes: Pupils are equal, round, and reactive to light. Conjunctivae and EOM are normal.  Neck: Normal range of motion. Neck supple. Carotid bruit is not present. No thyromegaly present.  Cardiovascular: Normal rate, regular rhythm, normal heart sounds and intact distal pulses. Exam reveals no gallop and no friction rub.  No murmur heard. Pulmonary/Chest: Effort normal and breath sounds normal. She has no wheezes. She has no rales.  Abdominal: Soft. Bowel sounds are normal. She exhibits no distension and no mass. There is no tenderness. There is no rebound and no guarding.  Lymphadenopathy:    She has no cervical adenopathy.  Neurological: She is alert and oriented to person, place, and time. No cranial nerve deficit.  Skin: Skin is warm and dry. No rash noted. She is not diaphoretic. No erythema. No pallor.  Psychiatric: She has a normal mood and affect. Her behavior is normal.   No results found. Depression screen [facility] 2/9 10/27/2017 06/16/2017  Decreased  Interest 0 0  Down, Depressed, Hopeless 1 0  PHQ - 2 Score 1 0   Fall Risk  10/27/2017 06/16/2017  Falls in the past year? No No        Assessment & Plan:   1. Type 2 diabetes mellitus without complication, without long-term current use of insulin ([place])   2. Pure hypercholesterolemia   3. Hematuria, microscopic   4. Ankle mass, left   5. Breast mass, right   6. Menometrorrhagia   7. Bilateral ovarian cysts   8. Tobacco abuse   9. Hot flashes due to surgical menopause     DMII: controlled; recent Hgba1c of 6.6.  Continue current dose of Metformin; Continue attempts at weight loss, exercise, low-carbohydrate food choices.  Hypercholesterolemia: controlled on statin.  Microscopic hematuria:  Will warrant repeat u/a in future once has recovered from hysterectomy and ankle surgery.  L ankle mass: has been referred to [facility] for consultation regarding surgical excision.  S/p surgical excision on 10/15/17.    B breast masses: s/p biopsies of B lesions; close follow-up.  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  B adnexal masses/cysts: s/p hysterectomy; no malignancy; elevated CA125.  Followed by gyn onc.  Hot flashes: New onset; rx for Gabapentin provided.  Effexor also provided.  Orders Placed This Encounter  Procedures  . CBC with Differential/Platelet  . Comprehensive metabolic panel    Order Specific Question:   Has the patient fasted?    Answer:   No  . Lipid panel    Order Specific Question:   Has the patient fasted?    Answer:   No  . POCT glucose (manual entry)   Meds ordered this encounter  Medications  . DISCONTD: venlafaxine XR (EFFEXOR-XR) 37.5 MG 24 hr capsule    Sig: Take 1 capsule (37.5 mg total) by mouth daily with breakfast.    Dispense:  30 capsule    Refill:  2  . gabapentin (NEURONTIN) 100 MG capsule    Sig: Take 1-3 capsules (100-300 mg total) by mouth at bedtime.  Dispense:  90 capsule    Refill:  2    Return in about 2  months (around 12/27/2017) for recheck.   Leviticus Harton Elayne Guerin, M.D. Primary Care at [facility] previously [facility] [address] [place], [place]  [address] [phone] phone [phone] fax

## 2017-10-28 LAB — COMPREHENSIVE METABOLIC PANEL
ALT: 43 IU/L — ABNORMAL HIGH (ref 0–32)
AST: 27 IU/L (ref 0–40)
Albumin/Globulin Ratio: 1.8 (ref 1.2–2.2)
Albumin: 4.2 g/dL (ref 3.5–5.5)
Alkaline Phosphatase: 73 IU/L (ref 39–117)
BUN/Creatinine Ratio: 17 (ref 9–23)
BUN: 9 mg/dL (ref 6–24)
Bilirubin Total: 0.5 mg/dL (ref 0.0–1.2)
CO2: 22 mmol/L (ref 20–29)
Calcium: 9.9 mg/dL (ref 8.7–10.2)
Chloride: 102 mmol/L (ref 96–106)
Creatinine, Ser: 0.53 mg/dL — ABNORMAL LOW (ref 0.57–1.00)
GFR calc Af Amer: 133 mL/min/{1.73_m2} (ref >59)
GFR calc non Af Amer: 115 mL/min/{1.73_m2} (ref >59)
Globulin, Total: 2.3 g/dL (ref 1.5–4.5)
Glucose: 137 mg/dL — ABNORMAL HIGH (ref 65–99)
Potassium: 4.6 mmol/L (ref 3.5–5.2)
Sodium: 140 mmol/L (ref 134–144)
Total Protein: 6.5 g/dL (ref 6.0–8.5)

## 2017-10-28 LAB — CBC WITH DIFFERENTIAL/PLATELET
Basophils Absolute: 0.1 10*3/uL (ref 0.0–0.2)
Basos: 1 %
EOS (ABSOLUTE): 0.3 10*3/uL (ref 0.0–0.4)
Eos: 4 %
Hematocrit: 39.3 % (ref 34.0–46.6)
Hemoglobin: 12.6 g/dL (ref 11.1–15.9)
Immature Grans (Abs): 0 10*3/uL (ref 0.0–0.1)
Immature Granulocytes: 0 %
Lymphocytes Absolute: 2.3 10*3/uL (ref 0.7–3.1)
Lymphs: 24 %
MCH: 30.7 pg (ref 26.6–33.0)
MCHC: 32.1 g/dL (ref 31.5–35.7)
MCV: 96 fL (ref 79–97)
Monocytes Absolute: 0.7 10*3/uL (ref 0.1–0.9)
Monocytes: 8 %
Neutrophils Absolute: 6 10*3/uL (ref 1.4–7.0)
Neutrophils: 63 %
Platelets: 376 10*3/uL (ref 150–379)
RBC: 4.1 x10E6/uL (ref 3.77–5.28)
RDW: 13.4 % (ref 12.3–15.4)
WBC: 9.4 10*3/uL (ref 3.4–10.8)

## 2017-10-28 LAB — LIPID PANEL
Chol/HDL Ratio: 6.5 ratio — ABNORMAL HIGH (ref 0.0–4.4)
Cholesterol, Total: 279 mg/dL — ABNORMAL HIGH (ref 100–199)
HDL: 43 mg/dL (ref >39)
LDL Calculated: 172 mg/dL — ABNORMAL HIGH (ref 0–99)
Triglycerides: 319 mg/dL — ABNORMAL HIGH (ref 0–149)
VLDL Cholesterol Cal: 64 mg/dL — ABNORMAL HIGH (ref 5–40)

## 2017-10-29 ENCOUNTER — Encounter: Payer: Self-pay | Admitting: Family Medicine

## 2017-11-05 DIAGNOSIS — R2242 Localized swelling, mass and lump, left lower limb: Secondary | ICD-10-CM | POA: Diagnosis not present

## 2017-11-17 ENCOUNTER — Encounter: Payer: Self-pay | Admitting: Obstetrics

## 2017-11-17 ENCOUNTER — Encounter: Payer: Self-pay | Admitting: Gynecologic Oncology

## 2017-11-17 ENCOUNTER — Inpatient Hospital Stay: Payer: 59 | Attending: Gynecologic Oncology | Admitting: Obstetrics

## 2017-11-17 VITALS — BP 140/76 | HR 123 | Temp 98.0°F | Resp 20 | Ht 61.0 in

## 2017-11-17 DIAGNOSIS — N83202 Unspecified ovarian cyst, left side: Secondary | ICD-10-CM

## 2017-11-17 DIAGNOSIS — N83201 Unspecified ovarian cyst, right side: Secondary | ICD-10-CM | POA: Insufficient documentation

## 2017-11-17 DIAGNOSIS — Z9889 Other specified postprocedural states: Secondary | ICD-10-CM | POA: Insufficient documentation

## 2017-11-17 NOTE — Patient Instructions (Signed)
1. You are discharged back to your regular Gyn. 2. Try Black Cohosh for hot flashes 3. Return to work OK May 30,2019 no restrictions. 4. Discussed scar massage OK. 5. Nothing per vagina until 6 weeks from surgery

## 2017-11-18 ENCOUNTER — Other Ambulatory Visit: Payer: Self-pay | Admitting: Family Medicine

## 2017-11-19 NOTE — Progress Notes (Addendum)
  HPI:  Jody Duke is a 47 y.o. year old initially seen in consultation on 09/29/2017 for bilateral adnexal masses.  She then underwent a laparotomy, TAH, BSO, staging on 0/11/2374 without complications.  Her postoperative course was uncomplicated.  Frozen section pathology was suspicious for borderline tumor.  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  Uterus and cervix were normal no evidence of dysplasia 1 lymph node showed in the endosalpingosis.  She is seen today for a final postoperative check.  Review of systems:  Review of Systems  HENT:   Positive for sore throat.   Respiratory: Positive for cough.   Gastrointestinal: Positive for abdominal pain, constipation and diarrhea.  Endocrine: Positive for hot flashes.  Neurological: Positive for dizziness.  All other systems reviewed and are negative.  Pain is at umbilicus and 2/83 lasts 10-15 minutes. Described as pulling/sharp. No associated N/V. Worsens when she twists her body.   Physical Exam: Blood pressure 140/76, pulse (!) 123, temperature 98 F (36.7 C), temperature source Oral, resp. rate 20, height 5\' 1"  (1.549 m), last menstrual period 09/21/2017, SpO2 95 %.  ECOG PERFORMANCE STATUS: 1 - Symptomatic but completely ambulatory   General :  Well developed, 47 y.o., female in no apparent distress HEENT:  Normocephalic/atraumatic, symmetric, EOMI, eyelids normal Neck:   No visible masses.  Respiratory:  Respirations unlabored, no use of accessory muscles CV:   Deferred Breast:  Deferred Musculoskeletal: Normal muscle strength. Abdomen:  Soft nontender nondistended.  Incision is healed. No drainage or erythema. No visible masses or protrusion Extremities:  No visible edema or deformities. She has a cast on the left lower leg. Skin:   Normal inspection Neuro/Psych:  No focal motor deficit, no abnormal mental status. Normal gait.  Normal affect. Alert and oriented to person, place, and time  Pelvic:   Vaginal cuff intact and nearly completely healed.      Assessment:    47 y.o. 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   S/p Ex-laparotomy, TAH, BSO staging on 10/02/2017.   Plan: 1. Disposition to PCP and Dr. Phineas Real 2. For hot flashes she could try Black Cohosh until she gets in with Dr.Fontaine to discuss the risks/benefits of HRT.  Since she had benign findings there is no contraindication from my standpoint for HRT 3. For her umbilical "pulling" sensation I provided reassurance. She has no N/V accompanying the pain. 4. Diarrhea last visit- I ordered Cdiff. She never brought a sample. We'll call to remind her about this order. 5. May return to work 11/27/17 with no restrictions   Cc: Donalynn Furlong, MD (Referring Ob/Gyn) Reginia Forts, MD (PCP)

## 2017-11-20 ENCOUNTER — Encounter: Payer: Self-pay | Admitting: Family Medicine

## 2017-11-21 ENCOUNTER — Telehealth: Payer: Self-pay | Admitting: *Deleted

## 2017-11-21 ENCOUNTER — Encounter: Payer: Self-pay | Admitting: Obstetrics

## 2017-11-21 ENCOUNTER — Other Ambulatory Visit: Payer: Self-pay | Admitting: Family Medicine

## 2017-11-21 NOTE — Telephone Encounter (Signed)
Called patient to see if she was still having nausea and diarrhea, patient states " I am still having some diarrhea, today I went four times already, but then there are days that I don't go for 2 or three days at a time, a couple of weeks ago I was very constipated and a I drunk a warm cup of water mixed with epson salt and it helped my bowels to move".  "Sometimes the stools are very watery and sometimes not".  I ask the patient did she have the stool kit that was given to her on a visit a couple of weeks ago to obtain a specimen, the patient answered "no".  I told the patient to continue to monitor her stools over the weekend and our office would call to see how she is doing on Tuesday, May 28.  Patient agreed to monitor stool and verbalized understanding.

## 2017-11-21 NOTE — Telephone Encounter (Signed)
Called patient to follow -up to see if she is still having nausea and diarrhea.  Left a message for patient to return a call to our office.

## 2017-11-24 DIAGNOSIS — M25572 Pain in left ankle and joints of left foot: Secondary | ICD-10-CM | POA: Diagnosis not present

## 2017-11-24 DIAGNOSIS — G8929 Other chronic pain: Secondary | ICD-10-CM | POA: Diagnosis not present

## 2017-11-26 ENCOUNTER — Encounter: Payer: Self-pay | Admitting: Gynecologic Oncology

## 2017-11-27 ENCOUNTER — Telehealth: Payer: Self-pay | Admitting: *Deleted

## 2017-11-27 DIAGNOSIS — R2242 Localized swelling, mass and lump, left lower limb: Secondary | ICD-10-CM | POA: Diagnosis not present

## 2017-11-27 NOTE — Telephone Encounter (Signed)
I called patient to follow-up on her my chart message.  Patient states "I am having some spots of bleeding when I wipe my bottom".  "I am still having diarrhea about two to three times a day, sometimes it's very watery and sometimes it's very loose, but not formed".  "I am using tissue to wipe my bottom and then a wet wipe".  I told patient to wear a panty liner to see if she is bleeding from the vaginal area, and to also be intentional about checking her underwear for bleeding.  I told patient to monitor this for three or four days and then give our office a call back to let us know if she is still having bleeding and diarrhea.  Patient verbalizes  understanding.

## 2017-12-26 NOTE — Progress Notes (Signed)
Subjective:    Patient ID: Jody Duke, female    DOB: 1971/03/08, 47 y.o.   MRN: [id number]  12/31/2017  Medical Management of Chronic Issues    HPI This 47 y.o. female presents for two month follow-up of DMII, ankle mass, ovarian mass, breast mass, menopausal hot flashes.  Also complaining of vaginal itching for the past week.  Management changes made at last visit include the following:  DMII: controlled; recent Hgba1c of 6.6.  Continue current dose of Metformin; Continue attempts at weight loss, exercise, low-carbohydrate food choices. Hypercholesterolemia: controlled on statin. Microscopic hematuria:  Will warrant repeat u/a in future once has recovered from hysterectomy and ankle surgery. L ankle mass: has been referred to [place] for consultation regarding surgical excision.  S/p surgical excision on 10/15/17.   B breast masses: s/p biopsies of B lesions; close follow-up.  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 B adnexal masses/cysts: s/p hysterectomy; no malignancy; elevated CA125.  Followed by gyn onc. Hot flashes: New onset; rx for Gabapentin provided.  Effexor also provided.  UPDATE: Now in a CAM walker LEFT for six weeks.  Goes to see her on 01/12/18.  Pain is still horrible at night.  Going to pain management; seeing pain management.   Toes are horrible; has numbness persistent.  Also taking Gabapentin '300mg'$  tid for nerve pain.  Hot flashes: taking Venlafaxine and gabapentin; not effective.  Gabapentin '300mg'$  tid.    DMII: sugar was 350 when traveling recently.  Usually 140-180.  Patient reports good compliance with medication, good tolerance to medication, and good symptom control.    Vaginal itching: onset two days ago; scratched horribly; now bleeding and hurts when burning.  Mild discharge; since hysterectomy; all panty wet; no urine odor.  Funky smell.   White clear discharge; almost cottage cheese and snotty some.     BP Readings from Last 3  Encounters:  12/31/17 126/80  11/17/17 140/76  10/27/17 108/70   Wt Readings from Last 3 Encounters:  12/31/17 205 lb 3.2 oz (93.1 kg)  10/13/17 211 lb 14.4 oz (96.1 kg)  10/02/17 217 lb (98.4 kg)   Immunization History  Administered Date(s) Administered  . Influenza,inj,Quad PF,6+ Mos 02/27/2017  . Influenza-Unspecified 03/31/2017  . Pneumococcal Polysaccharide-23 07/28/2017  . Tdap 06/16/2017    Review of Systems  Constitutional: Negative for activity change, appetite change, chills, diaphoresis, fatigue, fever and unexpected weight change.  HENT: Negative for congestion, dental problem, drooling, ear discharge, ear pain, facial swelling, hearing loss, mouth sores, nosebleeds, postnasal drip, rhinorrhea, sinus pressure, sneezing, sore throat, tinnitus, trouble swallowing and voice change.   Eyes: Negative for photophobia, pain, discharge, redness, itching and visual disturbance.  Respiratory: Negative for apnea, cough, choking, chest tightness, shortness of breath, wheezing and stridor.   Cardiovascular: Negative for chest pain, palpitations and leg swelling.  Gastrointestinal: Negative for abdominal distention, abdominal pain, anal bleeding, blood in stool, constipation, diarrhea, nausea, rectal pain and vomiting.  Endocrine: Negative for cold intolerance, heat intolerance, polydipsia, polyphagia and polyuria.  Genitourinary: Positive for vaginal discharge. Negative for decreased urine volume, difficulty urinating, dyspareunia, dysuria, enuresis, flank pain, frequency, genital sores, hematuria, menstrual problem, pelvic pain, urgency, vaginal bleeding and vaginal pain.  Musculoskeletal: Positive for arthralgias, gait problem and joint swelling. Negative for back pain, myalgias, neck pain and neck stiffness.  Skin: Negative for color change, pallor, rash and wound.  Allergic/Immunologic: Negative for environmental allergies, food allergies and immunocompromised state.  Neurological:  Negative for dizziness,  tremors, seizures, syncope, facial asymmetry, speech difficulty, weakness, light-headedness, numbness and headaches.  Hematological: Negative for adenopathy. Does not bruise/bleed easily.  Psychiatric/Behavioral: Negative for agitation, behavioral problems, confusion, decreased concentration, dysphoric mood, hallucinations, self-injury, sleep disturbance and suicidal ideas. The patient is not nervous/anxious and is not hyperactive.     Past Medical History:  Diagnosis Date  . Breast mass, left 07/2017  . Diabetes mellitus without complication ([place])   . Hyperlipidemia   . Migraines    Past Surgical History:  Procedure Laterality Date  . BREAST BIOPSY Left 07/24/2017   Procedure: LEFT BREAST MASS EXCISIONAL BIOPSY;  Surgeon: Rolm Bookbinder, MD;  Location: [facility];  Service: General;  Laterality: Left;  . DEBULKING N/A 10/02/2017   Procedure: DEBULKING;  Surgeon: Isabel Caprice, MD;  Location: [facility];  Service: Gynecology;  Laterality: N/A;  . FOOT GANGLION EXCISION Left    age 34  . HYSTERECTOMY ABDOMINAL WITH SALPINGO-OOPHORECTOMY Bilateral 10/02/2017   Procedure: HYSTERECTOMY ABDOMINAL WITH SALPINGO-OOPHORECTOMY;  Surgeon: Isabel Caprice, MD;  Location: [facility];  Service: Gynecology;  Laterality: Bilateral;  bilateral salpingo-oophorectomy with possible total abdominal hysterectomy  . LAPAROTOMY WITH STAGING N/A 10/02/2017   Procedure: LAPAROTOMY WITH STAGING/OMENTECTOMY;  Surgeon: Isabel Caprice, MD;  Location: [facility];  Service: Gynecology;  Laterality: N/A;  . RE-EXCISION OF BREAST LUMPECTOMY Left 08/25/2017   Procedure: LEFT RE-EXCISION OF BREAST MARGINS ERAS PATHWAY;  Surgeon: Rolm Bookbinder, MD;  Location: [facility];  Service: General;  Laterality: Left;   Allergies  Allergen Reactions  . Percocet [Oxycodone-Acetaminophen] Nausea And Vomiting   Current Outpatient Medications on File Prior to Visit  Medication Sig  Dispense Refill  . gabapentin (NEURONTIN) 100 MG capsule Take 1-3 capsules (100-300 mg total) by mouth at bedtime. 90 capsule 2  . glucose blood test strip Check sugar once daily  Dx: DMII controlled, non-insulin, without complications 161 each 12  . glucose monitoring kit (FREESTYLE) monitoring kit 1 each by Does not apply route as needed for other. Dx: DMII non-insulin dependent without complications; controlled; 1 each 0  . ibuprofen (ADVIL,MOTRIN) 800 MG tablet Take 800 mg by mouth every 8 (eight) hours as needed for fever, headache, mild pain, moderate pain or cramping.    . Lancets MISC Check sugar once daily dx DMII controlled, non-insulin, without complications 096 each 11  . HYDROcodone-acetaminophen (NORCO) 10-325 MG tablet Take 1 tablet by mouth every 6 (six) hours as needed. for pain  0  . ondansetron (ZOFRAN) 4 MG tablet Take 4 mg by mouth every 8 (eight) hours as needed for nausea or vomiting.     No current facility-administered medications on file prior to visit.    Social History   Socioeconomic History  . Marital status: Single    Spouse name: Not on file  . Number of children: Not on file  . Years of education: Not on file  . Highest education level: Not on file  Occupational History  . Not on file  Social Needs  . Financial resource strain: Not on file  . Food insecurity:    Worry: Not on file    Inability: Not on file  . Transportation needs:    Medical: Not on file    Non-medical: Not on file  Tobacco Use  . Smoking status: Current Every Day Smoker    Packs/day: 0.00    Years: 30.00    Pack years: 0.00    Types: Cigarettes  . Smokeless tobacco: Never Used  .  Tobacco comment: 1 daily  Substance and Sexual Activity  . Alcohol use: No    Frequency: Never  . Drug use: No  . Sexual activity: Not Currently    Partners: Female    Comment: 1st intercourse 47 yo-More than 5 partners  Lifestyle  . Physical activity:    Days per week: Not on file    Minutes  per session: Not on file  . Stress: Not on file  Relationships  . Social connections:    Talks on phone: Not on file    Gets together: Not on file    Attends religious service: Not on file    Active member of club or organization: Not on file    Attends meetings of clubs or organizations: Not on file    Relationship status: Not on file  . Intimate partner violence:    Fear of current or ex partner: Not on file    Emotionally abused: Not on file    Physically abused: Not on file    Forced sexual activity: Not on file  Other Topics Concern  . Not on file  Social History Narrative   Marital status: single; girlfriend x 3 years; from [place]; moved [place] in 2015      Children: none      Lives: with girlfriend      Employment: works at [employer]; runs seasonal department      Tobacco: 1.5ppd x age 48.  Never quit.      Alcohol: None; rare      Drugs: week as teenager      Exercise: none      Sexual activity: females since 72; males prior.  One STD/Gonorrhea.   Family History  Problem Relation Age of Onset  . Hyperlipidemia Mother   . Alcohol abuse Mother   . Cirrhosis Mother   . Diabetes Father   . Hyperlipidemia Father   . Hypertension Father   . Stroke Father   . Heart disease Father 51       CAD with stenting multiple  . Hyperlipidemia Brother   . Hypertension Brother   . Breast cancer Maternal Grandmother        Post menopausal  . Pancreatic cancer Paternal Aunt   . Pancreatic cancer Paternal Aunt        Objective:    BP 126/80   Pulse (!) 116   Temp 98.7 F (37.1 C) (Oral)   Resp 18   Ht '5\' 1"'$  (1.549 m)   Wt 205 lb 3.2 oz (93.1 kg)   LMP 09/21/2017 (Exact Date)   SpO2 98%   BMI 38.77 kg/m  Physical Exam  Constitutional: She is oriented to person, place, and time. She appears well-developed and well-nourished. No distress.  HENT:  Head: Normocephalic and atraumatic.  Right Ear: External ear normal.  Left Ear: External ear normal.  Nose: Nose normal.    Mouth/Throat: Oropharynx is clear and moist.  Eyes: Pupils are equal, round, and reactive to light. Conjunctivae and EOM are normal.  Neck: Normal range of motion and full passive range of motion without pain. Neck supple. No JVD present. Carotid bruit is not present. No thyromegaly present.  Cardiovascular: Normal rate, regular rhythm and normal heart sounds. Exam reveals no gallop and no friction rub.  No murmur heard. Pulmonary/Chest: Effort normal and breath sounds normal. She has no wheezes. She has no rales.  Abdominal: Soft. Bowel sounds are normal. She exhibits no distension and no mass. There is no  tenderness. There is no rebound and no guarding.  Genitourinary: There is no rash, tenderness, lesion or injury on the right labia. There is no rash, tenderness, lesion or injury on the left labia. There is erythema in the vagina. No tenderness or bleeding in the vagina. No foreign body in the vagina. No signs of injury around the vagina. Vaginal discharge found.  Musculoskeletal:       Right shoulder: Normal.       Left shoulder: Normal.       Cervical back: Normal.  Lymphadenopathy:    She has no cervical adenopathy.  Neurological: She is alert and oriented to person, place, and time. She has normal reflexes. No cranial nerve deficit. She exhibits normal muscle tone. Coordination normal.  Skin: Skin is warm and dry. No rash noted. She is not diaphoretic. No erythema. No pallor.  Psychiatric: She has a normal mood and affect. Her behavior is normal. Judgment and thought content normal.  Nursing note and vitals reviewed.  No results found. Depression screen [facility] 2/9 12/31/2017 10/27/2017 06/16/2017  Decreased Interest 0 0 0  Down, Depressed, Hopeless 0 1 0  PHQ - 2 Score 0 1 0   Fall Risk  12/31/2017 10/27/2017 06/16/2017  Falls in the past year? No No No    Results for orders placed or performed in visit on 12/31/17  Comprehensive metabolic panel  Result Value Ref Range   Glucose 296  (H) 65 - 99 mg/dL   BUN 11 6 - 24 mg/dL   Creatinine, Ser 0.53 (L) 0.57 - 1.00 mg/dL   GFR calc non Af Amer 115 >59 mL/min/1.73   GFR calc Af Amer 133 >59 mL/min/1.73   BUN/Creatinine Ratio 21 9 - 23   Sodium 139 134 - 144 mmol/L   Potassium 4.3 3.5 - 5.2 mmol/L   Chloride 102 96 - 106 mmol/L   CO2 18 (L) 20 - 29 mmol/L   Calcium 9.9 8.7 - 10.2 mg/dL   Total Protein 6.9 6.0 - 8.5 g/dL   Albumin 4.1 3.5 - 5.5 g/dL   Globulin, Total 2.8 1.5 - 4.5 g/dL   Albumin/Globulin Ratio 1.5 1.2 - 2.2   Bilirubin Total 0.5 0.0 - 1.2 mg/dL   Alkaline Phosphatase 128 (H) 39 - 117 IU/L   AST 34 0 - 40 IU/L   ALT 63 (H) 0 - 32 IU/L  Lipid panel  Result Value Ref Range   Cholesterol, Total 207 (H) 100 - 199 mg/dL   Triglycerides 411 (H) 0 - 149 mg/dL   HDL 47 >39 mg/dL   VLDL Cholesterol Cal Comment 5 - 40 mg/dL   LDL Calculated Comment 0 - 99 mg/dL   Chol/HDL Ratio 4.4 0.0 - 4.4 ratio  POCT glycosylated hemoglobin (Hb A1C)  Result Value Ref Range   Hemoglobin A1C 9.1 (A) 4.0 - 5.6 %   HbA1c POC (<> result, manual entry)  4.0 - 5.6 %   HbA1c, POC (prediabetic range)  5.7 - 6.4 %   HbA1c, POC (controlled diabetic range)  0.0 - 7.0 %  POCT glucose (manual entry)  Result Value Ref Range   POC Glucose 291 (A) 70 - 99 mg/dl  POCT Wet + KOH Prep  Result Value Ref Range   Yeast by KOH Absent Absent   Yeast by wet prep Absent Absent   WBC by wet prep Moderate (A) Few   Clue Cells Wet Prep HPF POC Few (A) None   Trich by wet prep Absent Absent  Bacteria Wet Prep HPF POC Many (A) Few   Epithelial Cells By [employer] Pref ([facility]) Many (A) None, Few, Too numerous to count   RBC,UR,HPF,POC None None RBC/hpf       Assessment & Plan:   1. Type 2 diabetes mellitus without complication, without long-term current use of insulin ([place])   2. Pure hypercholesterolemia   3. Hot flashes due to surgical menopause   4. Ankle mass, left   5. Vaginal itching   6. Hematuria, microscopic     Diabetes mellitus  type 2: Uncontrolled.  Increase metformin to 1000 mg twice daily. Hypercholesterolemia: Controlled on atorvastatin therapy obtain labs for chronic disease management. Hot flashes due to surgical menopause: Uncontrolled.  Increase Effexor to 75 mg daily.  Continue gabapentin 300 mg 3 times daily.  May need to increase gabapentin therapy at next visit. Left ankle mass: Status post surgical resection at [facility].  No malignancy identified.  Recovering well yet suffering with significant pain.  Maintained on gabapentin therapy.  Agreeable to tramadol prescription until pain management appointment. Vaginal itching: New onset.  Consistent with vaginal candidiasis due to uncontrolled diabetes mellitus.  Treat with Diflucan and Terconazole therapies. Microscopic hematuria: Will warrant repeat urinalysis with urine micro and next visit.  Orders Placed This Encounter  Procedures  . Comprehensive metabolic panel    Order Specific Question:   Has the patient fasted?    Answer:   No  . Lipid panel    Order Specific Question:   Has the patient fasted?    Answer:   No  . POCT glycosylated hemoglobin (Hb A1C)  . POCT glucose (manual entry)  . POCT Wet + KOH Prep   Meds ordered this encounter  Medications  . terconazole (TERAZOL 7) 0.4 % vaginal cream    Sig: Place 1 applicator vaginally at bedtime.    Dispense:  45 g    Refill:  0  . fluconazole (DIFLUCAN) 150 MG tablet    Sig: Take 1 tablet (150 mg total) by mouth once for 1 dose. Repeat if needed    Dispense:  2 tablet    Refill:  0  . metFORMIN (GLUCOPHAGE) 1000 MG tablet    Sig: Take 1 tablet (1,000 mg total) by mouth 2 (two) times daily with a meal.    Dispense:  180 tablet    Refill:  1  . atorvastatin (LIPITOR) 10 MG tablet    Sig: Take 1 tablet (10 mg total) by mouth daily.    Dispense:  90 tablet    Refill:  1  . venlafaxine XR (EFFEXOR-XR) 75 MG 24 hr capsule    Sig: Take 1 capsule (75 mg total) by mouth daily with  breakfast.    Dispense:  90 capsule    Refill:  1  . traMADol (ULTRAM) 50 MG tablet    Sig: Take 1-2 tablets (50-100 mg total) by mouth at bedtime.    Dispense:  60 tablet    Refill:  0    Return in about 3 months (around 04/02/2018) for follow-up chronic medical conditions [place].   Tennyson Kallen Elayne Guerin, M.D. Primary Care at [facility] previously [facility] [place] [address] [place],   [address] [phone] phone [phone] fax

## 2017-12-28 DIAGNOSIS — E8941 Symptomatic postprocedural ovarian failure: Secondary | ICD-10-CM

## 2017-12-28 HISTORY — DX: Symptomatic postprocedural ovarian failure: E89.41

## 2017-12-29 ENCOUNTER — Encounter: Payer: Self-pay | Admitting: Family Medicine

## 2017-12-31 ENCOUNTER — Ambulatory Visit (INDEPENDENT_AMBULATORY_CARE_PROVIDER_SITE_OTHER): Payer: 59 | Admitting: Family Medicine

## 2017-12-31 ENCOUNTER — Encounter: Payer: Self-pay | Admitting: Family Medicine

## 2017-12-31 ENCOUNTER — Other Ambulatory Visit: Payer: Self-pay

## 2017-12-31 VITALS — BP 126/80 | HR 116 | Temp 98.7°F | Resp 18 | Ht 61.0 in | Wt 205.2 lb

## 2017-12-31 DIAGNOSIS — E119 Type 2 diabetes mellitus without complications: Secondary | ICD-10-CM

## 2017-12-31 DIAGNOSIS — E8941 Symptomatic postprocedural ovarian failure: Secondary | ICD-10-CM | POA: Diagnosis not present

## 2017-12-31 DIAGNOSIS — E7800 Pure hypercholesterolemia, unspecified: Secondary | ICD-10-CM | POA: Diagnosis not present

## 2017-12-31 DIAGNOSIS — N898 Other specified noninflammatory disorders of vagina: Secondary | ICD-10-CM | POA: Diagnosis not present

## 2017-12-31 DIAGNOSIS — R2242 Localized swelling, mass and lump, left lower limb: Secondary | ICD-10-CM | POA: Diagnosis not present

## 2017-12-31 DIAGNOSIS — R3129 Other microscopic hematuria: Secondary | ICD-10-CM

## 2017-12-31 LAB — POCT WET + KOH PREP
Trich by wet prep: ABSENT
Yeast by KOH: ABSENT
Yeast by wet prep: ABSENT

## 2017-12-31 LAB — GLUCOSE, POCT (MANUAL RESULT ENTRY): POC Glucose: 291 mg/dl — AB (ref 70–99)

## 2017-12-31 LAB — POCT GLYCOSYLATED HEMOGLOBIN (HGB A1C): Hemoglobin A1C: 9.1 % — AB (ref 4.0–5.6)

## 2017-12-31 MED ORDER — FLUCONAZOLE 150 MG PO TABS: 150.0000 mg | ORAL_TABLET | Freq: Once | ORAL | 0 refills | Status: AC

## 2017-12-31 MED ORDER — METFORMIN HCL 1000 MG PO TABS: 1000.0000 mg | ORAL_TABLET | Freq: Two times a day (BID) | ORAL | 1 refills | Status: DC

## 2017-12-31 MED ORDER — TERCONAZOLE 0.4 % VA CREA: 1.0000 | TOPICAL_CREAM | Freq: Every day | VAGINAL | 0 refills | Status: DC

## 2017-12-31 MED ORDER — VENLAFAXINE HCL ER 75 MG PO CP24: 75.0000 mg | ORAL_CAPSULE | Freq: Every day | ORAL | 1 refills | Status: DC

## 2017-12-31 MED ORDER — TRAMADOL HCL 50 MG PO TABS: 50.0000 mg | ORAL_TABLET | Freq: Every day | ORAL | 0 refills | Status: DC

## 2017-12-31 MED ORDER — ATORVASTATIN CALCIUM 10 MG PO TABS: 10.0000 mg | ORAL_TABLET | Freq: Every day | ORAL | 1 refills | Status: DC

## 2017-12-31 NOTE — Patient Instructions (Addendum)
   Increase Metformin to 1000mg  twice daily. Increase Venlafaxine to 75mg  every morning.  IF you received an x-ray today, you will receive an invoice from [facility]. Please contact [facility] at [phone] with questions or concerns regarding your invoice.   IF you received labwork today, you will receive an invoice from [facility]. Please contact [facility] at [phone] with questions or concerns regarding your invoice.   Our billing staff will not be able to assist you with questions regarding bills from these companies.  You will be contacted with the lab results as soon as they are available. The fastest way to get your results is to activate your My Chart account. Instructions are located on the last page of this paperwork. If you have not heard from [place] regarding the results in 2 weeks, please contact this office.

## 2018-01-01 LAB — COMPREHENSIVE METABOLIC PANEL
ALT: 63 IU/L — ABNORMAL HIGH (ref 0–32)
AST: 34 IU/L (ref 0–40)
Albumin/Globulin Ratio: 1.5 (ref 1.2–2.2)
Albumin: 4.1 g/dL (ref 3.5–5.5)
Alkaline Phosphatase: 128 IU/L — ABNORMAL HIGH (ref 39–117)
BUN/Creatinine Ratio: 21 (ref 9–23)
BUN: 11 mg/dL (ref 6–24)
Bilirubin Total: 0.5 mg/dL (ref 0.0–1.2)
CO2: 18 mmol/L — ABNORMAL LOW (ref 20–29)
Calcium: 9.9 mg/dL (ref 8.7–10.2)
Chloride: 102 mmol/L (ref 96–106)
Creatinine, Ser: 0.53 mg/dL — ABNORMAL LOW (ref 0.57–1.00)
GFR calc Af Amer: 133 mL/min/{1.73_m2} (ref >59)
GFR calc non Af Amer: 115 mL/min/{1.73_m2} (ref >59)
Globulin, Total: 2.8 g/dL (ref 1.5–4.5)
Glucose: 296 mg/dL — ABNORMAL HIGH (ref 65–99)
Potassium: 4.3 mmol/L (ref 3.5–5.2)
Sodium: 139 mmol/L (ref 134–144)
Total Protein: 6.9 g/dL (ref 6.0–8.5)

## 2018-01-01 LAB — LIPID PANEL
Chol/HDL Ratio: 4.4 ratio (ref 0.0–4.4)
Cholesterol, Total: 207 mg/dL — ABNORMAL HIGH (ref 100–199)
HDL: 47 mg/dL (ref >39)
Triglycerides: 411 mg/dL — ABNORMAL HIGH (ref 0–149)

## 2018-01-04 ENCOUNTER — Encounter: Payer: Self-pay | Admitting: Family Medicine

## 2018-01-07 ENCOUNTER — Encounter: Payer: Self-pay | Admitting: Family Medicine

## 2018-01-12 ENCOUNTER — Encounter: Payer: Self-pay | Admitting: Family Medicine

## 2018-01-14 DIAGNOSIS — M25572 Pain in left ankle and joints of left foot: Secondary | ICD-10-CM | POA: Diagnosis not present

## 2018-01-16 ENCOUNTER — Encounter: Payer: Self-pay | Admitting: Family Medicine

## 2018-01-20 MED ORDER — DULAGLUTIDE 0.75 MG/0.5ML ~~LOC~~ SOAJ: 0.7500 mg | SUBCUTANEOUS | 5 refills | Status: DC

## 2018-01-20 NOTE — Addendum Note (Signed)
Addended by: Wardell Honour on: 01/20/2018 12:14 PM   Modules accepted: Orders

## 2018-01-30 DIAGNOSIS — M25572 Pain in left ankle and joints of left foot: Secondary | ICD-10-CM | POA: Diagnosis not present

## 2018-02-04 ENCOUNTER — Encounter: Payer: Self-pay | Admitting: Family Medicine

## 2018-02-12 DIAGNOSIS — M25572 Pain in left ankle and joints of left foot: Secondary | ICD-10-CM | POA: Diagnosis not present

## 2018-03-12 DIAGNOSIS — M25572 Pain in left ankle and joints of left foot: Secondary | ICD-10-CM | POA: Diagnosis not present

## 2018-03-31 ENCOUNTER — Other Ambulatory Visit: Payer: Self-pay

## 2018-03-31 ENCOUNTER — Encounter: Payer: Self-pay | Admitting: Family Medicine

## 2018-03-31 ENCOUNTER — Encounter (HOSPITAL_COMMUNITY): Payer: Self-pay

## 2018-03-31 ENCOUNTER — Emergency Department (HOSPITAL_COMMUNITY): Payer: 59

## 2018-03-31 ENCOUNTER — Emergency Department (HOSPITAL_COMMUNITY)
Admission: EM | Admit: 2018-03-31 | Discharge: 2018-03-31 | Disposition: A | Discharge status: Home / Self Care | Payer: 59 | Attending: Emergency Medicine | Admitting: Emergency Medicine

## 2018-03-31 DIAGNOSIS — E119 Type 2 diabetes mellitus without complications: Secondary | ICD-10-CM | POA: Insufficient documentation

## 2018-03-31 DIAGNOSIS — Z7984 Long term (current) use of oral hypoglycemic drugs: Secondary | ICD-10-CM | POA: Insufficient documentation

## 2018-03-31 DIAGNOSIS — N23 Unspecified renal colic: Secondary | ICD-10-CM | POA: Diagnosis not present

## 2018-03-31 DIAGNOSIS — F17210 Nicotine dependence, cigarettes, uncomplicated: Secondary | ICD-10-CM | POA: Diagnosis not present

## 2018-03-31 DIAGNOSIS — Z79899 Other long term (current) drug therapy: Secondary | ICD-10-CM | POA: Diagnosis not present

## 2018-03-31 DIAGNOSIS — R1031 Right lower quadrant pain: Secondary | ICD-10-CM | POA: Diagnosis present

## 2018-03-31 DIAGNOSIS — N200 Calculus of kidney: Secondary | ICD-10-CM

## 2018-03-31 DIAGNOSIS — R109 Unspecified abdominal pain: Secondary | ICD-10-CM

## 2018-03-31 DIAGNOSIS — R319 Hematuria, unspecified: Secondary | ICD-10-CM | POA: Diagnosis not present

## 2018-03-31 LAB — COMPREHENSIVE METABOLIC PANEL
ALT: 51 U/L — ABNORMAL HIGH (ref 0–44)
AST: 35 U/L (ref 15–41)
Albumin: 3.4 g/dL — ABNORMAL LOW (ref 3.5–5.0)
Alkaline Phosphatase: 76 U/L (ref 38–126)
Anion gap: 11 (ref 5–15)
BUN: 13 mg/dL (ref 6–20)
CO2: 24 mmol/L (ref 22–32)
Calcium: 9.4 mg/dL (ref 8.9–10.3)
Chloride: 108 mmol/L (ref 98–111)
Creatinine, Ser: 0.58 mg/dL (ref 0.44–1.00)
GFR calc Af Amer: >60 mL/min (ref >60)
GFR calc non Af Amer: >60 mL/min (ref >60)
Glucose, Bld: 184 mg/dL — ABNORMAL HIGH (ref 70–99)
Potassium: 3.8 mmol/L (ref 3.5–5.1)
Sodium: 143 mmol/L (ref 135–145)
Total Bilirubin: 0.6 mg/dL (ref 0.3–1.2)
Total Protein: 6.3 g/dL — ABNORMAL LOW (ref 6.5–8.1)

## 2018-03-31 LAB — CBC WITH DIFFERENTIAL/PLATELET
Basophils Absolute: 0.1 10*3/uL (ref 0.0–0.1)
Basophils Relative: 1 %
Eosinophils Absolute: 0.1 10*3/uL (ref 0.0–0.7)
Eosinophils Relative: 1 %
HCT: 36.8 % (ref 36.0–46.0)
Hemoglobin: 12.4 g/dL (ref 12.0–15.0)
Lymphocytes Relative: 20 %
Lymphs Abs: 2.2 10*3/uL (ref 0.7–4.0)
MCH: 30.8 pg (ref 26.0–34.0)
MCHC: 33.7 g/dL (ref 30.0–36.0)
MCV: 91.5 fL (ref 78.0–100.0)
Monocytes Absolute: 0.5 10*3/uL (ref 0.1–1.0)
Monocytes Relative: 5 %
Neutro Abs: 8.2 10*3/uL — ABNORMAL HIGH (ref 1.7–7.7)
Neutrophils Relative %: 73 %
Platelets: 276 10*3/uL (ref 150–400)
RBC: 4.02 MIL/uL (ref 3.87–5.11)
RDW: 12.8 % (ref 11.5–15.5)
WBC: 11 10*3/uL — ABNORMAL HIGH (ref 4.0–10.5)

## 2018-03-31 LAB — URINALYSIS, ROUTINE W REFLEX MICROSCOPIC
Bilirubin Urine: NEGATIVE
Glucose, UA: NEGATIVE mg/dL
Ketones, ur: NEGATIVE mg/dL
Leukocytes, UA: NEGATIVE
Nitrite: NEGATIVE
Protein, ur: 100 mg/dL — AB
RBC / HPF: >50 RBC/hpf — ABNORMAL HIGH (ref 0–5)
Specific Gravity, Urine: 1.012 (ref 1.005–1.030)
pH: 5 (ref 5.0–8.0)

## 2018-03-31 MED ORDER — IBUPROFEN 800 MG PO TABS: 800.0000 mg | ORAL_TABLET | Freq: Three times a day (TID) | ORAL | 0 refills | Status: AC

## 2018-03-31 MED ORDER — TAMSULOSIN HCL 0.4 MG PO CAPS: 0.4000 mg | ORAL_CAPSULE | Freq: Every day | ORAL | 0 refills | Status: AC

## 2018-03-31 MED ORDER — KETOROLAC TROMETHAMINE 15 MG/ML IJ SOLN
15.0000 mg | Freq: Once | INTRAMUSCULAR | Status: AC
  Administered 2018-03-31: 15 mg via INTRAVENOUS
  Filled 2018-03-31: qty 1

## 2018-03-31 NOTE — Discharge Instructions (Addendum)
I have provided medication to help with passing of the stone. Please take this medication daily. If you experience any fever, chest pain, shortness of breath.Please follow up with PCP this week for reevaluation of your symptoms prior to returning to work.

## 2018-03-31 NOTE — ED Provider Notes (Signed)
[facility] DEPT Provider Note   CSN: [id number] Arrival date & time: 03/31/18  1608     History   Chief Complaint Chief Complaint  Patient presents with  . Urinary Frequency  . Dysuria  . Hematuria    HPI [name] is a 47 y.o. female.  47 y.o female with a PMH of diabetes type 2, hypercholesteremia presents to the ED with a chief complaint of urinary frequency that began last night. Patient reports she has trying to urinate and has been urinating frequently along with feeling a deep pressure when she urinates, she describes as a stabbing pain to her suprapubic region and right lower quadrant region that radiates to her back.  She has tried no medical therapy for her symptoms.  [facility] who an x-ray which did not show anything but they also collected her blood which showed urine in her blood.  She denies any fever, chest pain, shortness of breath, hematuria.  She denies any previous history of kidney stones.     Past Medical History:  Diagnosis Date  . Breast mass, left 07/2017  . Diabetes mellitus without complication ([place])   . Hyperlipidemia   . Migraines     Patient Active Problem List   Diagnosis Date Noted  . Hot flashes due to surgical menopause 12/28/2017  . Pure hypercholesterolemia 07/30/2017  . Type 2 diabetes mellitus without complication, without long-term current use of insulin ([place]) 07/30/2017  . Hematuria, microscopic 07/30/2017  . Menometrorrhagia 07/30/2017  . Ankle mass, left 07/30/2017  . Blood pressure elevated without history of HTN 07/30/2017  . Tobacco abuse 07/30/2017  . Breast mass, right 07/30/2017  . Class 1 obesity due to excess calories with serious comorbidity and body mass index (BMI) of 33.0 to 33.9 in adult 07/30/2017    Past Surgical History:  Procedure Laterality Date  . BREAST BIOPSY Left 07/24/2017   Procedure: LEFT BREAST MASS EXCISIONAL BIOPSY;  Surgeon: Rolm Bookbinder, MD;  Location: [facility];  Service: General;  Laterality: Left;  . DEBULKING N/A 10/02/2017   Procedure: DEBULKING;  Surgeon: Isabel Caprice, MD;  Location: [facility];  Service: Gynecology;  Laterality: N/A;  . FOOT GANGLION EXCISION Left    age 62  . HYSTERECTOMY ABDOMINAL WITH SALPINGO-OOPHORECTOMY Bilateral 10/02/2017   Procedure: HYSTERECTOMY ABDOMINAL WITH SALPINGO-OOPHORECTOMY;  Surgeon: Isabel Caprice, MD;  Location: [facility];  Service: Gynecology;  Laterality: Bilateral;  bilateral salpingo-oophorectomy with possible total abdominal hysterectomy  . LAPAROTOMY WITH STAGING N/A 10/02/2017   Procedure: LAPAROTOMY WITH STAGING/OMENTECTOMY;  Surgeon: Isabel Caprice, MD;  Location: [facility];  Service: Gynecology;  Laterality: N/A;  . RE-EXCISION OF BREAST LUMPECTOMY Left 08/25/2017   Procedure: LEFT RE-EXCISION OF BREAST MARGINS ERAS PATHWAY;  Surgeon: Rolm Bookbinder, MD;  Location: [facility];  Service: General;  Laterality: Left;     OB History    Gravida  0   Para  0   Term  0   Preterm  0   AB  0   Living  0     SAB  0   TAB  0   Ectopic  0   Multiple  0   Live Births  0            Home Medications    Prior to Admission medications   Medication Sig Start Date End Date Taking? Authorizing Provider  atorvastatin (LIPITOR) 10 MG tablet Take 1  tablet (10 mg total) by mouth daily. 12/31/17   Wardell Honour, MD  Dulaglutide (TRULICITY) 4.49 EE/1.0OF SOPN Inject 0.75 mg into the skin once a week. 01/20/18   Wardell Honour, MD  gabapentin (NEURONTIN) 100 MG capsule Take 1-3 capsules (100-300 mg total) by mouth at bedtime. 10/27/17   Wardell Honour, MD  glucose blood test strip Check sugar once daily  Dx: DMII controlled, non-insulin, without complications 07/22/95   Wardell Honour, MD  glucose monitoring kit (FREESTYLE) monitoring kit 1 each by Does not apply route as needed for other. Dx: DMII non-insulin dependent without  complications; controlled; 07/28/17   Wardell Honour, MD  HYDROcodone-acetaminophen [facility]) 10-325 MG tablet Take 1 tablet by mouth every 6 (six) hours as needed. for pain 10/15/17   [provider]  ibuprofen (ADVIL,MOTRIN) 800 MG tablet Take 1 tablet (800 mg total) by mouth 3 (three) times daily for 7 days. 03/31/18 04/07/18  Janeece Fitting, PA-C  Lancets MISC Check sugar once daily dx DMII controlled, non-insulin, without complications 5/88/32   Wardell Honour, MD  metFORMIN (GLUCOPHAGE) 1000 MG tablet Take 1 tablet (1,000 mg total) by mouth 2 (two) times daily with a meal. 12/31/17   Wardell Honour, MD  ondansetron (ZOFRAN) 4 MG tablet Take 4 mg by mouth every 8 (eight) hours as needed for nausea or vomiting.    [provider]  tamsulosin (FLOMAX) 0.4 MG CAPS capsule Take 1 capsule (0.4 mg total) by mouth daily for 7 days. 03/31/18 04/07/18  Janeece Fitting, PA-C  terconazole (TERAZOL 7) 0.4 % vaginal cream Place 1 applicator vaginally at bedtime. 12/31/17   Wardell Honour, MD  traMADol (ULTRAM) 50 MG tablet Take 1-2 tablets (50-100 mg total) by mouth at bedtime. 12/31/17   Wardell Honour, MD  venlafaxine XR (EFFEXOR-XR) 75 MG 24 hr capsule Take 1 capsule (75 mg total) by mouth daily with breakfast. 12/31/17   Wardell Honour, MD    Family History Family History  Problem Relation Age of Onset  . Hyperlipidemia Mother   . Alcohol abuse Mother   . Cirrhosis Mother   . Diabetes Father   . Hyperlipidemia Father   . Hypertension Father   . Stroke Father   . Heart disease Father 72       CAD with stenting multiple  . Hyperlipidemia Brother   . Hypertension Brother   . Breast cancer Maternal Grandmother        Post menopausal  . Pancreatic cancer Paternal Aunt   . Pancreatic cancer Paternal Aunt     Social History Social History   Tobacco Use  . Smoking status: Current Every Day Smoker    Packs/day: 0.50    Years: 30.00    Pack years: 15.00    Types: Cigarettes  . Smokeless  tobacco: Never Used  . Tobacco comment: 1 daily  Substance Use Topics  . Alcohol use: No    Frequency: Never  . Drug use: No     Allergies   Percocet [oxycodone-acetaminophen]   Review of Systems Review of Systems  Constitutional: Negative for chills and fever.  HENT: Negative for sore throat.   Respiratory: Negative for chest tightness and shortness of breath.   Cardiovascular: Negative for chest pain and palpitations.  Gastrointestinal: Positive for abdominal pain (right lower quadrant). Negative for diarrhea, nausea and vomiting.  Genitourinary: Positive for flank pain. Negative for hematuria.  Musculoskeletal: Positive for back pain.  Skin: Negative for pallor and  wound.  Neurological: Negative for light-headedness and headaches.  All other systems reviewed and are negative.    Physical Exam Updated Vital Signs BP 130/79 (BP Location: Right Arm)   Pulse 79   Temp 98.8 F (37.1 C) (Oral)   Resp 14   Ht _0  (1.549 m)   Wt 90.3 kg   LMP 09/21/2017 (Exact Date)   SpO2 97%   BMI 37.60 kg/m   Physical Exam  Constitutional: She is oriented to person, place, and time. She appears well-developed and well-nourished.  HENT:  Head: Normocephalic and atraumatic.  Eyes: Pupils are equal, round, and reactive to light.  Neck: Normal range of motion. Neck supple.  Cardiovascular: Normal heart sounds.  Pulmonary/Chest: Breath sounds normal. She has no wheezes. She exhibits no tenderness.  Abdominal: Soft. Bowel sounds are normal. There is tenderness (right lower quadrant) in the right lower quadrant. There is CVA tenderness (right sided).  RLQ pain tender to palpation radiating to her lower back region.   Musculoskeletal: She exhibits no tenderness or deformity.  Neurological: She is alert and oriented to person, place, and time.  Skin: Skin is warm and dry.  Nursing note and vitals reviewed.    ED Treatments / Results  Labs (all labs ordered are listed, but only  abnormal results are displayed) Labs Reviewed  URINALYSIS, ROUTINE W REFLEX MICROSCOPIC - Abnormal; Notable for the following components:      Result Value   Hgb urine dipstick LARGE (*)    Protein, ur 100 (*)    RBC / HPF >50 (*)    Bacteria, UA FEW (*)    All other components within normal limits  CBC WITH DIFFERENTIAL/PLATELET - Abnormal; Notable for the following components:   WBC 11.0 (*)    Neutro Abs 8.2 (*)    All other components within normal limits  COMPREHENSIVE METABOLIC PANEL - Abnormal; Notable for the following components:   Glucose, Bld 184 (*)    Total Protein 6.3 (*)    Albumin 3.4 (*)    ALT 51 (*)    All other components within normal limits    EKG None  Radiology Ct Renal Stone Study  Result Date: 03/31/2018 CLINICAL DATA:  Right flank pain since this morning. EXAM: CT ABDOMEN AND PELVIS WITHOUT CONTRAST TECHNIQUE: Multidetector CT imaging of the abdomen and pelvis was performed following the standard protocol without IV contrast. COMPARISON:  09/18/2017 FINDINGS: Lower chest: Lung bases are normal. Hepatobiliary: Diffuse low-attenuation of the liver without focal mass. Gallbladder and biliary tree are normal. Pancreas: Normal. Spleen: Normal. Adrenals/Urinary Tract: Adrenal glands are normal. Kidneys are normal in size without focal mass or nephrolithiasis. There is mild dilatation of the right intrarenal collecting system with minimal right perinephric inflammation/fluid. Mild prominence of the right ureter as there is a 3 mm stone over the distal ureter at the UVJ. Left ureter and bladder are normal. Stomach/Bowel: Stomach and small bowel are within normal. There is a very short segment of small bowel within a small umbilical hernia. Appendix is normal. Colon is normal. Vascular/Lymphatic: Very minimal calcified plaque over the abdominal aorta. No adenopathy. Reproductive: Previous hysterectomy and bilateral oophorectomy. Other: Multiple pelvic phleboliths. No  significant free fluid. Small umbilical hernia containing only a short segment of small bowel. Postsurgical change over the midline anterior abdominal wall. Musculoskeletal: Unremarkable. IMPRESSION: 3 mm stone at the right UVJ causing low-grade obstruction. Mild hepatic steatosis. Postsurgical change compatible with previous hysterectomy and bilateral oophorectomy. Aortic Atherosclerosis (ICD10-I70.0). Electronically  Signed   By: Marin Olp M.D.   On: 03/31/2018 18:29    Procedures Procedures (including critical care time)  Medications Ordered in ED Medications  ketorolac (TORADOL) 15 MG/ML injection 15 mg (15 mg Intravenous Given 03/31/18 1826)     Initial Impression / Assessment and Plan / ED Course  I have reviewed the triage vital signs and the nursing notes.  Pertinent labs & imaging results that were available during my care of the patient were reviewed by me and considered in my medical decision making (see chart for details).    [facility] for further evaluation for kidney stone. She reports blood in her urine.  CMP showed slight elevation of ALT 51. CBC showed slight elevation of WBC 11.0. UA showed large Hgb, > 50 RBC and few bacteria. Leukocytes and Nitrites negative. I ordered a CT renal study to r/o any stone pathology or hydranitis   CT Renal study showed 3 mm stone at the right UVJ causing low-grade obstruction. Patient received toradol for pain and states her pain has improve at this time patient will be sent home with pain control and tamsulosin for stone expulsion. Patient has an appt  with her PCP on Thursday and states she will need a work note will provide this for her. She is advised to follow up with PCP prior to returning to work. Return precautions provided at length.   Final Clinical Impressions(s) / ED Diagnoses   Final diagnoses:  Nephrolithiasis  Flank pain    ED Discharge Orders         Ordered    tamsulosin (FLOMAX) 0.4 MG  CAPS capsule  Daily     03/31/18 1930    ibuprofen (ADVIL,MOTRIN) 800 MG tablet  3 times daily     03/31/18 1938           Janeece Fitting, PA-C 03/31/18 1938    Quintella Reichert, MD 04/02/18 [id number]

## 2018-03-31 NOTE — ED Triage Notes (Addendum)
Patient c/o urinary frequency, dysuria, right lower quadrant abdominal pain hematuria and emesis x 1 episode since this AM. patient went to [facility] today and was told to come to the ED.

## 2018-04-02 ENCOUNTER — Encounter: Payer: Self-pay | Admitting: Family Medicine

## 2018-04-02 ENCOUNTER — Other Ambulatory Visit: Payer: Self-pay

## 2018-04-02 ENCOUNTER — Ambulatory Visit (INDEPENDENT_AMBULATORY_CARE_PROVIDER_SITE_OTHER): Payer: 59 | Admitting: Family Medicine

## 2018-04-02 VITALS — BP 132/83 | HR 83 | Temp 98.0°F | Resp 18 | Ht 63.27 in | Wt 200.2 lb

## 2018-04-02 DIAGNOSIS — E7800 Pure hypercholesterolemia, unspecified: Secondary | ICD-10-CM | POA: Diagnosis not present

## 2018-04-02 DIAGNOSIS — E8941 Symptomatic postprocedural ovarian failure: Secondary | ICD-10-CM | POA: Diagnosis not present

## 2018-04-02 DIAGNOSIS — E119 Type 2 diabetes mellitus without complications: Secondary | ICD-10-CM | POA: Diagnosis not present

## 2018-04-02 LAB — POCT GLYCOSYLATED HEMOGLOBIN (HGB A1C): Hemoglobin A1C: 7.1 % — AB (ref 4.0–5.6)

## 2018-04-02 LAB — LIPID PANEL
Chol/HDL Ratio: 3.5 ratio (ref 0.0–4.4)
Cholesterol, Total: 156 mg/dL (ref 100–199)
HDL: 45 mg/dL (ref >39)
LDL Calculated: 61 mg/dL (ref 0–99)
Triglycerides: 248 mg/dL — ABNORMAL HIGH (ref 0–149)
VLDL Cholesterol Cal: 50 mg/dL — ABNORMAL HIGH (ref 5–40)

## 2018-04-02 MED ORDER — GABAPENTIN 600 MG PO TABS: 600.0000 mg | ORAL_TABLET | Freq: Every day | ORAL | 1 refills | Status: DC

## 2018-04-02 MED ORDER — DULAGLUTIDE 0.75 MG/0.5ML ~~LOC~~ SOAJ: 0.7500 mg | SUBCUTANEOUS | 5 refills | Status: DC

## 2018-04-02 MED ORDER — VENLAFAXINE HCL ER 150 MG PO CP24: 150.0000 mg | ORAL_CAPSULE | Freq: Every day | ORAL | 1 refills | Status: DC

## 2018-04-02 NOTE — Patient Instructions (Addendum)
   If you have lab work done today you will be contacted with your lab results within the next 2 weeks.  If you have not heard from us then please contact us. The fastest way to get your results is to register for My Chart.   IF you received an x-ray today, you will receive an invoice from [facility]. Please contact [facility] at [phone] with questions or concerns regarding your invoice.   IF you received labwork today, you will receive an invoice from [facility]. Please contact [facility] at [phone] with questions or concerns regarding your invoice.   Our billing staff will not be able to assist you with questions regarding bills from these companies.  You will be contacted with the lab results as soon as they are available. The fastest way to get your results is to activate your My Chart account. Instructions are located on the last page of this paperwork. If you have not heard from us regarding the results in 2 weeks, please contact this office.     Diabetes Mellitus and Nutrition When you have diabetes (diabetes mellitus), it is very important to have healthy eating habits because your blood sugar (glucose) levels are greatly affected by what you eat and drink. Eating healthy foods in the appropriate amounts, at about the same times every day, can help you:  Control your blood glucose.  Lower your risk of heart disease.  Improve your blood pressure.  Reach or maintain a healthy weight.  Every person with diabetes is different, and each person has different needs for a meal plan. Your health care provider may recommend that you work with a diet and nutrition specialist (dietitian) to make a meal plan that is best for you. Your meal plan may vary depending on factors such as:  The calories you need.  The medicines you take.  Your weight.  Your blood glucose, blood pressure, and cholesterol levels.  Your activity level.  Other health conditions you  have, such as heart or kidney disease.  How do carbohydrates affect me? Carbohydrates affect your blood glucose level more than any other type of food. Eating carbohydrates naturally increases the amount of glucose in your blood. Carbohydrate counting is a method for keeping track of how many carbohydrates you eat. Counting carbohydrates is important to keep your blood glucose at a healthy level, especially if you use insulin or take certain oral diabetes medicines. It is important to know how many carbohydrates you can safely have in each meal. This is different for every person. Your dietitian can help you calculate how many carbohydrates you should have at each meal and for snack. Foods that contain carbohydrates include:  Bread, cereal, rice, pasta, and crackers.  Potatoes and corn.  Peas, beans, and lentils.  Milk and yogurt.  Fruit and juice.  Desserts, such as cakes, cookies, ice cream, and candy.  How does alcohol affect me? Alcohol can cause a sudden decrease in blood glucose (hypoglycemia), especially if you use insulin or take certain oral diabetes medicines. Hypoglycemia can be a life-threatening condition. Symptoms of hypoglycemia (sleepiness, dizziness, and confusion) are similar to symptoms of having too much alcohol. If your health care provider says that alcohol is safe for you, follow these guidelines:  Limit alcohol intake to no more than 1 drink per day for nonpregnant women and 2 drinks per day for men. One drink equals 12 oz of beer, 5 oz of wine, or 1 oz of hard liquor.  Do   not drink on an empty stomach.  Keep yourself hydrated with water, diet soda, or unsweetened iced tea.  Keep in mind that regular soda, juice, and other mixers may contain a lot of sugar and must be counted as carbohydrates.  What are tips for following this plan? Reading food labels  Start by checking the serving size on the label. The amount of calories, carbohydrates, fats, and other  nutrients listed on the label are based on one serving of the food. Many foods contain more than one serving per package.  Check the total grams (g) of carbohydrates in one serving. You can calculate the number of servings of carbohydrates in one serving by dividing the total carbohydrates by 15. For example, if a food has 30 g of total carbohydrates, it would be equal to 2 servings of carbohydrates.  Check the number of grams (g) of saturated and trans fats in one serving. Choose foods that have low or no amount of these fats.  Check the number of milligrams (mg) of sodium in one serving. Most people should limit total sodium intake to less than 2,300 mg per day.  Always check the nutrition information of foods labeled as "low-fat" or "nonfat". These foods may be higher in added sugar or refined carbohydrates and should be avoided.  Talk to your dietitian to identify your daily goals for nutrients listed on the label. Shopping  Avoid buying canned, premade, or processed foods. These foods tend to be high in fat, sodium, and added sugar.  Shop around the outside edge of the grocery store. This includes fresh fruits and vegetables, bulk grains, fresh meats, and fresh dairy. Cooking  Use low-heat cooking methods, such as baking, instead of high-heat cooking methods like deep frying.  Cook using healthy oils, such as olive, canola, or sunflower oil.  Avoid cooking with butter, cream, or high-fat meats. Meal planning  Eat meals and snacks regularly, preferably at the same times every day. Avoid going long periods of time without eating.  Eat foods high in fiber, such as fresh fruits, vegetables, beans, and whole grains. Talk to your dietitian about how many servings of carbohydrates you can eat at each meal.  Eat 4-6 ounces of lean protein each day, such as lean meat, chicken, fish, eggs, or tofu. 1 ounce is equal to 1 ounce of meat, chicken, or fish, 1 egg, or 1/4 cup of tofu.  Eat some  foods each day that contain healthy fats, such as avocado, nuts, seeds, and fish. Lifestyle   Check your blood glucose regularly.  Exercise at least 30 minutes 5 or more days each week, or as told by your health care provider.  Take medicines as told by your health care provider.  Do not use any products that contain nicotine or tobacco, such as cigarettes and e-cigarettes. If you need help quitting, ask your health care provider.  Work with a counselor or diabetes educator to identify strategies to manage stress and any emotional and social challenges. What are some questions to ask my health care provider?  Do I need to meet with a diabetes educator?  Do I need to meet with a dietitian?  What number can I call if I have questions?  When are the best times to check my blood glucose? Where to find more information:  American Diabetes Association: diabetes.org/food-and-fitness/food  Academy of Nutrition and Dietetics: www.eatright.org/resources/health/diseases-and-conditions/diabetes  National Institute of Diabetes and Digestive and Kidney Diseases (NIH): www.niddk.nih.gov/health-information/diabetes/overview/diet-eating-physical-activity Summary  A healthy meal plan will   help you control your blood glucose and maintain a healthy lifestyle.  Working with a diet and nutrition specialist (dietitian) can help you make a meal plan that is best for you.  Keep in mind that carbohydrates and alcohol have immediate effects on your blood glucose levels. It is important to count carbohydrates and to use alcohol carefully. This information is not intended to replace advice given to you by your health care provider. Make sure you discuss any questions you have with your health care provider. Document Released: 03/14/2005 Document Revised: 07/22/2016 Document Reviewed: 07/22/2016 Elsevier Interactive Patient Education  2018 [employer]  

## 2018-04-02 NOTE — Progress Notes (Signed)
10/3/20198:59 AM  Jody Duke 09-13-1970, 47 y.o. female [id number]  Chief Complaint  Patient presents with  . Diabetes    f/u    HPI:   Patient is a 47 y.o. female with past medical history significant for DM2, hotflshes who presents today to establish care  Previous patient of dr Tamala Julian Last visit in July  diabtes mellitus 2 - has changed diet, lots of fruits and vegetables Trying to cut out pasta and rice, a bit harder Increased metformin to 1065m BID, tolerating well Trulicity, weekly, tolerating well Has been losing weight Checking fasting 115-120s  Having problems with hotflashes after hysterectomy in april On effexor and gabapentin  Had ankle surgery, left, for mass Has been doing PT Still having pain and numbness Back at work, works at [employer]  Yesterday unable to urinate, tinkiling Then started having horrible right flank pain Was told she had kidney stone   Lab Results  Component Value Date   HGBA1C 9.1 (A) 12/31/2017   HGBA1C 6.6 (H) 09/26/2017   HGBA1C 6.7 (H) 06/16/2017   Lab Results  Component Value Date   [place] 12/31/2017   CREATININE 0.58 03/31/2018    Fall Risk  04/02/2018 12/31/2017 10/27/2017 06/16/2017  Falls in the past year? No No No No     [facility] 04/02/2018 12/31/2017 10/27/2017  Decreased Interest 0 0 0  Down, Depressed, Hopeless 0 0 1  PHQ - 2 Score 0 0 1    Allergies  Allergen Reactions  . Percocet [Oxycodone-Acetaminophen] Nausea And Vomiting    Prior to Admission medications   Medication Sig Start Date End Date Taking? Authorizing Provider  atorvastatin (LIPITOR) 10 MG tablet Take 1 tablet (10 mg total) by mouth daily. 12/31/17  Yes SWardell Honour MD  Dulaglutide (TRULICITY) 08.27MMB/8.6LJSOPN Inject 0.75 mg into the skin once a week. 01/20/18  Yes SWardell Honour MD  gabapentin (NEURONTIN) 100 MG capsule Take 1-3 capsules (100-300 mg total) by mouth at bedtime. 10/27/17  Yes SWardell Honour MD    glucose blood test strip Check sugar once daily  Dx: DMII controlled, non-insulin, without complications 14/49/20 Yes SWardell Honour MD  glucose monitoring kit (FREESTYLE) monitoring kit 1 each by Does not apply route as needed for other. Dx: DMII non-insulin dependent without complications; controlled; 07/28/17  Yes SWardell Honour MD  HYDROcodone-acetaminophen ([facility] 10-325 MG tablet Take 1 tablet by mouth every 6 (six) hours as needed. for pain 10/15/17  Yes [provider]  ibuprofen (ADVIL,MOTRIN) 800 MG tablet Take 1 tablet (800 mg total) by mouth 3 (three) times daily for 7 days. 03/31/18 04/07/18 Yes SJaneece Fitting PA-C  Lancets MISC Check sugar once daily dx DMII controlled, non-insulin, without complications 11/00/71 Yes SWardell Honour MD  metFORMIN (GLUCOPHAGE) 1000 MG tablet Take 1 tablet (1,000 mg total) by mouth 2 (two) times daily with a meal. 12/31/17  Yes SWardell Honour MD  ondansetron (ZOFRAN) 4 MG tablet Take 4 mg by mouth every 8 (eight) hours as needed for nausea or vomiting.   Yes [provider]  tamsulosin (FLOMAX) 0.4 MG CAPS capsule Take 1 capsule (0.4 mg total) by mouth daily for 7 days. 03/31/18 04/07/18 Yes Soto, JBeverley Fiedler PA-C  terconazole (TERAZOL 7) 0.4 % vaginal cream Place 1 applicator vaginally at bedtime. 12/31/17  Yes SWardell Honour MD  traMADol (ULTRAM) 50 MG tablet Take 1-2 tablets (50-100 mg total) by mouth at bedtime. 12/31/17  Yes Wardell Honour, MD  venlafaxine XR (EFFEXOR-XR) 75 MG 24 hr capsule Take 1 capsule (75 mg total) by mouth daily with breakfast. 12/31/17  Yes Wardell Honour, MD    Past Medical History:  Diagnosis Date  . Breast mass, left 07/2017  . Diabetes mellitus without complication ([place])   . Hyperlipidemia   . Migraines     Past Surgical History:  Procedure Laterality Date  . ANKLE SURGERY Left 10/15/2017  . BREAST BIOPSY Left 07/24/2017   Procedure: LEFT BREAST MASS EXCISIONAL BIOPSY;  Surgeon: Rolm Bookbinder, MD;   Location: [place];  Service: General;  Laterality: Left;  . DEBULKING N/A 10/02/2017   Procedure: DEBULKING;  Surgeon: Isabel Caprice, MD;  Location: [facility];  Service: Gynecology;  Laterality: N/A;  . FOOT GANGLION EXCISION Left    age 48  . HYSTERECTOMY ABDOMINAL WITH SALPINGO-OOPHORECTOMY Bilateral 10/02/2017   Procedure: HYSTERECTOMY ABDOMINAL WITH SALPINGO-OOPHORECTOMY;  Surgeon: Isabel Caprice, MD;  Location: [facility];  Service: Gynecology;  Laterality: Bilateral;  bilateral salpingo-oophorectomy with possible total abdominal hysterectomy  . LAPAROTOMY WITH STAGING N/A 10/02/2017   Procedure: LAPAROTOMY WITH STAGING/OMENTECTOMY;  Surgeon: Isabel Caprice, MD;  Location: [facility];  Service: Gynecology;  Laterality: N/A;  . RE-EXCISION OF BREAST LUMPECTOMY Left 08/25/2017   Procedure: LEFT RE-EXCISION OF BREAST MARGINS ERAS PATHWAY;  Surgeon: Rolm Bookbinder, MD;  Location: [place];  Service: General;  Laterality: Left;    Social History   Tobacco Use  . Smoking status: Current Every Day Smoker    Packs/day: 0.50    Years: 30.00    Pack years: 15.00    Types: Cigarettes  . Smokeless tobacco: Never Used  . Tobacco comment: 1 daily  Substance Use Topics  . Alcohol use: No    Frequency: Never    Family History  Problem Relation Age of Onset  . Hyperlipidemia Mother   . Alcohol abuse Mother   . Cirrhosis Mother   . Diabetes Father   . Hyperlipidemia Father   . Hypertension Father   . Stroke Father   . Heart disease Father 66       CAD with stenting multiple  . Hyperlipidemia Brother   . Hypertension Brother   . Breast cancer Maternal Grandmother        Post menopausal  . Pancreatic cancer Paternal Aunt   . Pancreatic cancer Paternal Aunt     Review of Systems  Constitutional: Negative for chills and fever.  Respiratory: Negative for cough and shortness of breath.   Cardiovascular: Negative for chest pain, palpitations and leg swelling.   Gastrointestinal: Negative for abdominal pain, nausea and vomiting.     OBJECTIVE:  Blood pressure 132/83, pulse 83, temperature 98 F (36.7 C), temperature source Oral, resp. rate 18, height 5' 3.27" (1.607 m), weight 200 lb 3.2 oz (90.8 kg), last menstrual period 09/21/2017, SpO2 96 %. Body mass index is 35.16 kg/m.   Physical Exam  Constitutional: She is oriented to person, place, and time. She appears well-developed and well-nourished.  HENT:  Head: Normocephalic and atraumatic.  Mouth/Throat: Oropharynx is clear and moist. No oropharyngeal exudate.  Eyes: Pupils are equal, round, and reactive to light. Conjunctivae and EOM are normal. No scleral icterus.  Neck: Neck supple.  Cardiovascular: Normal rate, regular rhythm and normal heart sounds. Exam reveals no gallop and no friction rub.  No murmur heard. Pulmonary/Chest: Effort normal and breath sounds normal. She has no wheezes. She has no  rales.  Musculoskeletal: She exhibits no edema.  Neurological: She is alert and oriented to person, place, and time.  Skin: Skin is warm and dry.  Psychiatric: She has a normal mood and affect.  Nursing note and vitals reviewed.  Diabetic Foot Exam - Simple   Simple Foot Form Visual Inspection No deformities, no ulcerations, no other skin breakdown bilaterally:  Yes Sensation Testing See comments:  Yes Pulse Check Posterior Tibialis and Dorsalis pulse intact bilaterally:  Yes Comments Left foot big toe was unable to feel touch    Results for orders placed or performed in visit on 04/02/18 (from the past 24 hour(s))  POCT glycosylated hemoglobin (Hb A1C)     Status: Abnormal   Collection Time: 04/02/18  9:17 AM  Result Value Ref Range   Hemoglobin A1C 7.1 (A) 4.0 - 5.6 %   HbA1c POC (<> result, manual entry)     HbA1c, POC (prediabetic range)     HbA1c, POC (controlled diabetic range)        ASSESSMENT and PLAN  1. Type 2 diabetes mellitus without complication, without  long-term current use of insulin (HCC) Controlled. Continue current regime.  - POCT glycosylated hemoglobin (Hb A1C) - Ambulatory referral to Ophthalmology  2. Pure hypercholesterolemia Checking labs today, medications will be adjusted as needed. discsused LFM - Lipid panel  3. Hot flashes due to surgical menopause Uncontrolled. Increasing effexor, changing gabapentin to 65m at bedtime, should also help with ankle pain  Other orders - venlafaxine XR (EFFEXOR-XR) 150 MG 24 hr capsule; Take 1 capsule (150 mg total) by mouth daily with breakfast. - gabapentin (NEURONTIN) 600 MG tablet; Take 1 tablet (600 mg total) by mouth at bedtime.    Return in about 3 months (around 07/03/2018) for diabestes and cholesterol.    IRutherford Guys MD [facility] [place] [id number].  [phone] [phone]

## 2018-04-04 ENCOUNTER — Encounter: Payer: Self-pay | Admitting: Family Medicine

## 2018-04-08 ENCOUNTER — Encounter: Payer: Self-pay | Admitting: Family Medicine

## 2018-04-09 DIAGNOSIS — M25572 Pain in left ankle and joints of left foot: Secondary | ICD-10-CM | POA: Diagnosis not present

## 2018-05-02 ENCOUNTER — Encounter: Payer: Self-pay | Admitting: Family Medicine

## 2018-06-08 ENCOUNTER — Other Ambulatory Visit: Payer: Self-pay | Admitting: Family Medicine

## 2018-06-08 NOTE — Telephone Encounter (Signed)
Copied from [place] [id number]. Topic: General - Other >> Jun 08, 2018 10:10 AM Lennox Solders wrote: Reason for CRM: [facility] [address] [facility] over a refill request on metformin 1000 mg twice a day and has not received a response. Pt needs medication. Pt last ov with dr Pamella Pert 04-02-18

## 2018-06-10 MED ORDER — METFORMIN HCL 1000 MG PO TABS: 1000.0000 mg | ORAL_TABLET | Freq: Two times a day (BID) | ORAL | 1 refills | Status: DC

## 2018-06-16 ENCOUNTER — Encounter: Payer: Self-pay | Admitting: Gynecology

## 2018-06-16 ENCOUNTER — Ambulatory Visit (INDEPENDENT_AMBULATORY_CARE_PROVIDER_SITE_OTHER): Payer: 59 | Admitting: Gynecology

## 2018-06-16 VITALS — BP 134/80 | Ht 62.0 in | Wt 188.0 lb

## 2018-06-16 DIAGNOSIS — Z01419 Encounter for gynecological examination (general) (routine) without abnormal findings: Secondary | ICD-10-CM | POA: Diagnosis not present

## 2018-06-16 DIAGNOSIS — N951 Menopausal and female climacteric states: Secondary | ICD-10-CM

## 2018-06-16 NOTE — Patient Instructions (Signed)
Office will call you in follow-up about treatment of the menopausal symptoms.

## 2018-06-16 NOTE — Progress Notes (Addendum)
    Jody Duke [birth year] [id number]        47 y.o.  G0P0000 for annual gynecologic exam.  Complaining of menopausal symptoms.  Having hot flushes and sweats.  Also having emotional swings.  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  Also has a history of phyllodes tumor of the breast excision.  Past medical history,surgical history, problem list, medications, allergies, family history and social history were all reviewed and documented as reviewed in the EPIC chart.  ROS:  Performed with pertinent positives and negatives included in the history, assessment and plan.   Additional significant findings : None   Exam: Caryn Bee assistant Vitals:   06/16/18 0804  BP: 134/80  Weight: 188 lb (85.3 kg)  Height: 5\' 2"  (1.575 m)   Body mass index is 34.39 kg/m.  General appearance:  Normal affect, orientation and appearance. Skin: Grossly normal HEENT: Without gross lesions.  No cervical or supraclavicular adenopathy. Thyroid normal.  Lungs:  Clear without wheezing, rales or rhonchi Cardiac: RR, without RMG Abdominal:  Soft, nontender, without masses, guarding, rebound, organomegaly or hernia Breasts:  Examined lying and sitting without masses, retractions, discharge or axillary adenopathy. Pelvic:  Ext, BUS, Vagina: Normal  Adnexa: Without masses or tenderness    Anus and perineum: Normal   Rectovaginal: Normal sphincter tone without palpated masses or tenderness.    Assessment/Plan:  47 y.o. G0P0000 female for annual gynecologic exam status post TAH/BSO.   1. Menopausal symptoms.  Status post TAH/BSO earlier this year.  Currently on Effexor.  Symptoms significant with hot flushes sweats and emotional swings.  Has a history of the phyllodes tumor excision from the breast.  We discussed HRT in detail to include the risks versus benefits.  Various studies were also reviewed.  Increased risk of thrombosis  such as stroke heart attack DVT in the breast cancer issue reviewed.  Benefits as far as symptom relief as well as possible cardiovascular and bone health when started early in a patient who is 57 also discussed.  I reviewed her history of phyllodes tumor.  At this point the patient would like to start on ERT.  I discussed with her I like to do a little more reading first and that I will follow-up with her by phone. 2. Pap smear/HPV 2018.  No Pap smear done today. 3. Mammography coming due in January and she will follow-up for this.  Breast exam normal today. 4. Health maintenance.  No routine lab work done as patient does this elsewhere.  Follow-up 1 year, sooner as needed.   Anastasio Auerbach MD, 8:30 AM 06/16/2018

## 2018-06-17 ENCOUNTER — Encounter: Payer: Self-pay | Admitting: Family Medicine

## 2018-06-18 DIAGNOSIS — M25572 Pain in left ankle and joints of left foot: Secondary | ICD-10-CM | POA: Diagnosis not present

## 2018-06-26 ENCOUNTER — Encounter: Payer: Self-pay | Admitting: *Deleted

## 2018-06-26 ENCOUNTER — Telehealth: Payer: Self-pay | Admitting: Gynecology

## 2018-06-26 MED ORDER — ESTRADIOL 0.5 MG PO TABS: 0.5000 mg | ORAL_TABLET | Freq: Every day | ORAL | 3 refills | Status: DC

## 2018-06-26 NOTE — Telephone Encounter (Signed)
Sent my chart message,confirming the pharmacy.

## 2018-06-26 NOTE — Telephone Encounter (Signed)
Tell patient I reviewed the Phyllodes tumor and HRT.  There is not a lot written about it.  I do not think there is a absolute contraindication to HRT and as long as she is willing to accept not a lot written about HRT and phyllodes tumor then I think it is reasonable to go ahead and start on estradiol 0.5 mg daily.  We will see if this does not help with her hot flushes and sweats.  If she does not get an adequate response after 1 month then call and we can adjust her dose.

## 2018-06-29 NOTE — Telephone Encounter (Signed)
Patient read my chart message Rx sent.

## 2018-07-09 ENCOUNTER — Ambulatory Visit (INDEPENDENT_AMBULATORY_CARE_PROVIDER_SITE_OTHER): Payer: 59 | Admitting: Family Medicine

## 2018-07-09 ENCOUNTER — Other Ambulatory Visit: Payer: Self-pay

## 2018-07-09 ENCOUNTER — Encounter: Payer: Self-pay | Admitting: Family Medicine

## 2018-07-09 VITALS — BP 128/88 | HR 86 | Temp 98.6°F | Resp 18 | Ht 62.0 in | Wt 190.6 lb

## 2018-07-09 DIAGNOSIS — R1011 Right upper quadrant pain: Secondary | ICD-10-CM

## 2018-07-09 DIAGNOSIS — E8941 Symptomatic postprocedural ovarian failure: Secondary | ICD-10-CM

## 2018-07-09 DIAGNOSIS — E119 Type 2 diabetes mellitus without complications: Secondary | ICD-10-CM

## 2018-07-09 DIAGNOSIS — R809 Proteinuria, unspecified: Secondary | ICD-10-CM

## 2018-07-09 DIAGNOSIS — Z720 Tobacco use: Secondary | ICD-10-CM | POA: Diagnosis not present

## 2018-07-09 DIAGNOSIS — R112 Nausea with vomiting, unspecified: Secondary | ICD-10-CM

## 2018-07-09 DIAGNOSIS — E7800 Pure hypercholesterolemia, unspecified: Secondary | ICD-10-CM | POA: Diagnosis not present

## 2018-07-09 LAB — CMP14+EGFR
ALT: 26 IU/L (ref 0–32)
AST: 21 IU/L (ref 0–40)
Albumin/Globulin Ratio: 1.9 (ref 1.2–2.2)
Albumin: 4 g/dL (ref 3.5–5.5)
Alkaline Phosphatase: 85 IU/L (ref 39–117)
BUN/Creatinine Ratio: 19 (ref 9–23)
BUN: 10 mg/dL (ref 6–24)
Bilirubin Total: 0.5 mg/dL (ref 0.0–1.2)
CO2: 21 mmol/L (ref 20–29)
Calcium: 9.5 mg/dL (ref 8.7–10.2)
Chloride: 103 mmol/L (ref 96–106)
Creatinine, Ser: 0.53 mg/dL — ABNORMAL LOW (ref 0.57–1.00)
GFR calc Af Amer: 132 mL/min/{1.73_m2} (ref >59)
GFR calc non Af Amer: 114 mL/min/{1.73_m2} (ref >59)
Globulin, Total: 2.1 g/dL (ref 1.5–4.5)
Glucose: 107 mg/dL — ABNORMAL HIGH (ref 65–99)
Potassium: 4.1 mmol/L (ref 3.5–5.2)
Sodium: 141 mmol/L (ref 134–144)
Total Protein: 6.1 g/dL (ref 6.0–8.5)

## 2018-07-09 LAB — LIPID PANEL
Chol/HDL Ratio: 3.4 ratio (ref 0.0–4.4)
Cholesterol, Total: 172 mg/dL (ref 100–199)
HDL: 51 mg/dL (ref >39)
LDL Calculated: 72 mg/dL (ref 0–99)
Triglycerides: 245 mg/dL — ABNORMAL HIGH (ref 0–149)
VLDL Cholesterol Cal: 49 mg/dL — ABNORMAL HIGH (ref 5–40)

## 2018-07-09 LAB — POCT GLYCOSYLATED HEMOGLOBIN (HGB A1C): Hemoglobin A1C: 6.1 % — AB (ref 4.0–5.6)

## 2018-07-09 MED ORDER — NICOTINE 14 MG/24HR TD PT24: 14.0000 mg | MEDICATED_PATCH | Freq: Every day | TRANSDERMAL | 0 refills | Status: DC

## 2018-07-09 MED ORDER — VENLAFAXINE HCL ER 75 MG PO CP24: 75.0000 mg | ORAL_CAPSULE | Freq: Every day | ORAL | 0 refills | Status: DC

## 2018-07-09 NOTE — Patient Instructions (Addendum)
1-800-quit-now   Coping with Quitting Smoking  Quitting smoking is a physical and mental challenge. You will face cravings, withdrawal symptoms, and temptation. Before quitting, work with your health care provider to make a plan that can help you cope. Preparation can help you quit and keep you from giving in. How can I cope with cravings? Cravings usually last for 5-10 minutes. If you get through it, the craving will pass. Consider taking the following actions to help you cope with cravings:  Keep your mouth busy: ? Chew sugar-free gum. ? Suck on hard candies or a straw. ? Brush your teeth.  Keep your hands and body busy: ? Immediately change to a different activity when you feel a craving. ? Squeeze or play with a ball. ? Do an activity or a hobby, like making bead jewelry, practicing needlepoint, or working with wood. ? Mix up your normal routine. ? Take a short exercise break. Go for a quick walk or run up and down stairs. ? Spend time in public places where smoking is not allowed.  Focus on doing something kind or helpful for someone else.  Call a friend or family member to talk during a craving.  Join a support group.  Call a quit line, such as 1-800-QUIT-NOW.  Talk with your health care provider about medicines that might help you cope with cravings and make quitting easier for you. How can I deal with withdrawal symptoms? Your body may experience negative effects as it tries to get used to not having nicotine in the system. These effects are called withdrawal symptoms. They may include:  Feeling hungrier than normal.  Trouble concentrating.  Irritability.  Trouble sleeping.  Feeling depressed.  Restlessness and agitation.  Craving a cigarette. To manage withdrawal symptoms:  Avoid places, people, and activities that trigger your cravings.  Remember why you want to quit.  Get plenty of sleep.  Avoid coffee and other caffeinated drinks. These may  worsen some of your symptoms. How can I handle social situations? Social situations can be difficult when you are quitting smoking, especially in the first few weeks. To manage this, you can:  Avoid parties, bars, and other social situations where people might be smoking.  Avoid alcohol.  Leave right away if you have the urge to smoke.  Explain to your family and friends that you are quitting smoking. Ask for understanding and support.  Plan activities with friends or family where smoking is not an option. What are some ways I can cope with stress? Wanting to smoke may cause stress, and stress can make you want to smoke. Find ways to manage your stress. Relaxation techniques can help. For example:  Breathe slowly and deeply, in through your nose and out through your mouth.  Listen to soothing, relaxing music.  Talk with a family member or friend about your stress.  Light a candle.  Soak in a bath or take a shower.  Think about a peaceful place. What are some ways I can prevent weight gain? Be aware that many people gain weight after they quit smoking. However, not everyone does. To keep from gaining weight, have a plan in place before you quit and stick to the plan after you quit. Your plan should include:  Having healthy snacks. When you have a craving, it may help to: ? Eat plain popcorn, crunchy carrots, celery, or other cut vegetables. ? Chew sugar-free gum.  Changing how you eat: ? Eat small portion sizes at meals. ?  Eat 4-6 small meals throughout the day instead of 1-2 large meals a day. ? Be mindful when you eat. Do not watch television or do other things that might distract you as you eat.  Exercising regularly: ? Make time to exercise each day. If you do not have time for a long workout, do short bouts of exercise for 5-10 minutes several times a day. ? Do some form of strengthening exercise, like weight lifting, and some form of aerobic exercise, like running or  swimming.  Drinking plenty of water or other low-calorie or no-calorie drinks. Drink 6-8 glasses of water daily, or as much as instructed by your health care provider. Summary  Quitting smoking is a physical and mental challenge. You will face cravings, withdrawal symptoms, and temptation to smoke again. Preparation can help you as you go through these challenges.  You can cope with cravings by keeping your mouth busy (such as by chewing gum), keeping your body and hands busy, and making calls to family, friends, or a helpline for people who want to quit smoking.  You can cope with withdrawal symptoms by avoiding places where people smoke, avoiding drinks with caffeine, and getting plenty of rest.  Ask your health care provider about the different ways to prevent weight gain, avoid stress, and handle social situations. This information is not intended to replace advice given to you by your health care provider. Make sure you discuss any questions you have with your health care provider. Document Released: 06/14/2016 Document Revised: 06/14/2016 Document Reviewed: 06/14/2016 Elsevier Interactive Patient Education  [employer].   If you have lab work done today you will be contacted with your lab results within the next 2 weeks.  If you have not heard from [place] then please contact us. The fastest way to get your results is to register for My Chart.   IF you received an x-ray today, you will receive an invoice from [facility]. Please contact [facility] at [phone] with questions or concerns regarding your invoice.   IF you received labwork today, you will receive an invoice from [facility]. Please contact [facility] at [phone] with questions or concerns regarding your invoice.   Our billing staff will not be able to assist you with questions regarding bills from these companies.  You will be contacted with the lab results as soon as they are available. The fastest  way to get your results is to activate your My Chart account. Instructions are located on the last page of this paperwork. If you have not heard from [place] regarding the results in 2 weeks, please contact this office.

## 2018-07-09 NOTE — Progress Notes (Signed)
1/9/20208:48 AM  Jody Duke 25-Mar-1971, 48 y.o. female [id number]  Chief Complaint  Patient presents with  . Diabetes    follow up as well as cholestrol and hot flashes     HPI:   Patient is a 48 y.o. female with past medical history significant for DM2, HLP, post-menopausal hotflashes who presents today for routine followup  Last OV 3 months ago Increased effexor and changed gabapentin to qhs - did not help  So she went to Gyn, started her on estrogen  Has been several episodes of mild hypoglycemia, 70 and 80, associated with  nausea and vomitng, cant figure out triggers, once she vomits she feels fine Denies any abd pain, fever, diarrhea, very mild intermittent constipation Denies any changes in appetite or weight  Checks twice a day, fasting and bedtime, she gets all her testing supplies thru work for free Fasting: 100-120 Bedtime: occasionally 160, usually around 120-130 Checks bp at home intermittently, reports it being normal  Gabapentin was started after left ankle surgery effexor was started for hostflashes, denies any issues with depression or anxiety  She continues to smoke, 1/2 ppd Has never really tried to quit before Has patches from work, gets them for free, tried for 3-4 days and then stopped. Interested in [place]  Lab Results  Component Value Date   HGBA1C 7.1 (A) 04/02/2018   HGBA1C 9.1 (A) 12/31/2017   HGBA1C 6.6 (H) 09/26/2017   Lab Results  Component Value Date   LDLCALC 61 04/02/2018   CREATININE 0.58 03/31/2018    Fall Risk  07/09/2018 04/02/2018 12/31/2017 10/27/2017 06/16/2017  Falls in the past year? 0 No No No No     Depression screen [facility] 2/9 07/09/2018 04/02/2018 12/31/2017  Decreased Interest 0 0 0  Down, Depressed, Hopeless 0 0 0  PHQ - 2 Score 0 0 0    Allergies  Allergen Reactions  . Percocet [Oxycodone-Acetaminophen] Nausea And Vomiting    Prior to Admission medications   Medication Sig Start Date End Date Taking? Authorizing  Provider  atorvastatin (LIPITOR) 10 MG tablet Take 1 tablet (10 mg total) by mouth daily. 12/31/17  Yes Wardell Honour, MD  Dulaglutide (TRULICITY) 0.86 VH/8.4ON SOPN Inject 0.75 mg into the skin once a week. 04/02/18  Yes Rutherford Guys, MD  estradiol (ESTRACE) 0.5 MG tablet Take 1 tablet (0.5 mg total) by mouth daily. 06/26/18  Yes Fontaine, Belinda Block, MD  gabapentin (NEURONTIN) 600 MG tablet Take 1 tablet (600 mg total) by mouth at bedtime. 04/02/18  Yes Rutherford Guys, MD  metFORMIN (GLUCOPHAGE) 1000 MG tablet Take 1 tablet (1,000 mg total) by mouth 2 (two) times daily with a meal. 06/10/18  Yes Rutherford Guys, MD  venlafaxine XR (EFFEXOR-XR) 150 MG 24 hr capsule Take 1 capsule (150 mg total) by mouth daily with breakfast. 04/02/18  Yes Rutherford Guys, MD    Past Medical History:  Diagnosis Date  . Breast mass, left 07/2017  . Diabetes mellitus without complication ([place])   . Hyperlipidemia   . Migraines     Past Surgical History:  Procedure Laterality Date  . ANKLE SURGERY Left 10/15/2017  . BREAST BIOPSY Left 07/24/2017   Procedure: LEFT BREAST MASS EXCISIONAL BIOPSY;  Surgeon: Rolm Bookbinder, MD;  Location: [place];  Service: General;  Laterality: Left;  . DEBULKING N/A 10/02/2017   Procedure: DEBULKING;  Surgeon: Isabel Caprice, MD;  [facility];  Service: Gynecology;  Laterality: N/A;  .  FOOT GANGLION EXCISION Left    age 12  . HYSTERECTOMY ABDOMINAL WITH SALPINGO-OOPHORECTOMY Bilateral 10/02/2017   Procedure: HYSTERECTOMY ABDOMINAL WITH SALPINGO-OOPHORECTOMY;  Surgeon: Isabel Caprice, MD;  [facility];  Service: Gynecology;  Laterality: Bilateral;  bilateral salpingo-oophorectomy with possible total abdominal hysterectomy  . LAPAROTOMY WITH STAGING N/A 10/02/2017   Procedure: LAPAROTOMY WITH STAGING/OMENTECTOMY;  Surgeon: Isabel Caprice, MD;  [facility];  Service: Gynecology;  Laterality: N/A;  . RE-EXCISION OF BREAST LUMPECTOMY Left  08/25/2017   Procedure: LEFT RE-EXCISION OF BREAST MARGINS ERAS PATHWAY;  Surgeon: Rolm Bookbinder, MD;  Location: [place];  Service: General;  Laterality: Left;    Social History   Tobacco Use  . Smoking status: Current Every Day Smoker    Packs/day: 0.50    Years: 30.00    Pack years: 15.00    Types: Cigarettes  . Smokeless tobacco: Never Used  . Tobacco comment: 1 daily  Substance Use Topics  . Alcohol use: No    Frequency: Never    Family History  Problem Relation Age of Onset  . Hyperlipidemia Mother   . Alcohol abuse Mother   . Cirrhosis Mother   . Diabetes Father   . Hyperlipidemia Father   . Hypertension Father   . Stroke Father   . Heart disease Father 21       CAD with stenting multiple  . Hyperlipidemia Brother   . Hypertension Brother   . Diabetes Sister   . Hyperlipidemia Sister   . Hypertension Sister   . Breast cancer Maternal Grandmother        Post menopausal  . Pancreatic cancer Paternal Aunt   . Pancreatic cancer Paternal Aunt     Review of Systems  Constitutional: Negative for chills and fever.  Respiratory: Negative for cough and shortness of breath.   Cardiovascular: Negative for chest pain, palpitations and leg swelling.  Gastrointestinal: Negative for abdominal pain, nausea and vomiting.     OBJECTIVE:  Blood pressure 128/88, pulse 86, temperature 98.6 F (37 C), temperature source Oral, resp. rate 18, height '5\' 2"'$  (1.575 m), weight 190 lb 9.6 oz (86.5 kg), last menstrual period 09/21/2017, SpO2 96 %. Body mass index is 34.86 kg/m.    Wt Readings from Last 3 Encounters:  07/09/18 190 lb 9.6 oz (86.5 kg)  06/16/18 188 lb (85.3 kg)  04/02/18 200 lb 3.2 oz (90.8 kg)    Physical Exam Vitals signs and nursing note reviewed.  Constitutional:      Appearance: She is well-developed.  HENT:     Head: Normocephalic and atraumatic.     Mouth/Throat:     Pharynx: No oropharyngeal exudate.  Eyes:     General: No  scleral icterus.    Conjunctiva/sclera: Conjunctivae normal.     Pupils: Pupils are equal, round, and reactive to light.  Neck:     Musculoskeletal: Neck supple.  Cardiovascular:     Rate and Rhythm: Normal rate and regular rhythm.     Heart sounds: Normal heart sounds. No murmur. No friction rub. No gallop.   Pulmonary:     Effort: Pulmonary effort is normal.     Breath sounds: Normal breath sounds. No wheezing or rales.  Abdominal:     General: Bowel sounds are normal.     Palpations: Abdomen is soft. There is no hepatomegaly or splenomegaly.     Tenderness: There is abdominal tenderness in the right upper quadrant. There is no guarding or rebound. Negative signs  include Murphy's sign.  Skin:    General: Skin is warm and dry.  Neurological:     Mental Status: She is alert and oriented to person, place, and time.     Results for orders placed or performed in visit on 07/09/18 (from the past 24 hour(s))  POCT glycosylated hemoglobin (Hb A1C)     Status: Abnormal   Collection Time: 07/09/18  8:54 AM  Result Value Ref Range   Hemoglobin A1C 6.1 (A) 4.0 - 5.6 %   HbA1c POC (<> result, manual entry)     HbA1c, POC (prediabetic range)     HbA1c, POC (controlled diabetic range)      Diabetic Foot Exam - Simple   Simple Foot Form Visual Inspection No deformities, no ulcerations, no other skin breakdown bilaterally:  Yes Sensation Testing Intact to touch and monofilament testing bilaterally:  Yes Pulse Check See comments:  Yes Comments Having some trouble in feeling toe spots on left foot due to surgery      ASSESSMENT and PLAN  1. Type 2 diabetes mellitus without complication, without long-term current use of insulin (HCC) Controlled. Continue current regime. Continue working on LFM - POCT glycosylated hemoglobin (Hb A1C) - CMP14+EGFR - HM DIABETES FOOT EXAM - Microalbumin/Creatinine Ratio, Urine - Care order/instruction:  2. Pure hypercholesterolemia Checking labs  today, medications will be adjusted as needed.  - Lipid panel - CMP14+EGFR  3. Hot flashes due to surgical menopause On HRT per gyn. Will start weaning effexor as not being helpful  4. Tobacco abuse Smoking cessation instruction/counseling given:  counseled patient on the dangers of tobacco use, advised patient to stop smoking, and reviewed strategies to maximize success  Discussed treatment options, patient decided to try patches  5. Nausea and vomiting in adult 6. RUQ abdominal pain Further workup/treatement pending results. Discussed RTC precautions - US Abdomen Limited RUQ; Future  Other orders - venlafaxine XR (EFFEXOR-XR) 75 MG 24 hr capsule; Take 1 capsule (75 mg total) by mouth daily with breakfast. - nicotine (NICODERM CQ - DOSED IN MG/24 HOURS) 14 mg/24hr patch; Place 1 patch (14 mg total) onto the skin daily.   Return in about 3 months (around 10/08/2018).    Rutherford Guys, MD Primary Care at [facility], [place] [address] Ph.  [phone] Fax [phone]

## 2018-07-10 LAB — MICROALBUMIN / CREATININE URINE RATIO
Creatinine, Urine: 68.9 mg/dL
Microalb/Creat Ratio: 1303.2 mg/g creat — ABNORMAL HIGH (ref 0.0–30.0)
Microalbumin, Urine: 897.9 ug/mL

## 2018-07-13 ENCOUNTER — Other Ambulatory Visit: Payer: 59

## 2018-07-13 DIAGNOSIS — R2242 Localized swelling, mass and lump, left lower limb: Secondary | ICD-10-CM | POA: Diagnosis not present

## 2018-07-13 DIAGNOSIS — R202 Paresthesia of skin: Secondary | ICD-10-CM | POA: Diagnosis not present

## 2018-07-15 ENCOUNTER — Ambulatory Visit
Admission: RE | Admit: 2018-07-15 | Discharge: 2018-07-15 | Disposition: A | Discharge status: Home / Self Care | Payer: 59 | Source: Ambulatory Visit | Attending: Family Medicine | Admitting: Family Medicine

## 2018-07-15 ENCOUNTER — Encounter: Payer: Self-pay | Admitting: Family Medicine

## 2018-07-15 DIAGNOSIS — R112 Nausea with vomiting, unspecified: Secondary | ICD-10-CM

## 2018-07-15 DIAGNOSIS — R1011 Right upper quadrant pain: Secondary | ICD-10-CM

## 2018-07-15 DIAGNOSIS — K760 Fatty (change of) liver, not elsewhere classified: Secondary | ICD-10-CM | POA: Diagnosis not present

## 2018-07-16 DIAGNOSIS — M25572 Pain in left ankle and joints of left foot: Secondary | ICD-10-CM | POA: Diagnosis not present

## 2018-07-16 MED ORDER — LISINOPRIL 10 MG PO TABS: 10.0000 mg | ORAL_TABLET | Freq: Every day | ORAL | 3 refills | Status: DC

## 2018-07-16 NOTE — Addendum Note (Signed)
Addended by: Rutherford Guys on: 07/16/2018 11:53 PM   Modules accepted: Orders

## 2018-07-29 ENCOUNTER — Encounter: Payer: Self-pay | Admitting: Family Medicine

## 2018-07-30 MED ORDER — BUPROPION HCL ER (SR) 150 MG PO TB12: 150.0000 mg | ORAL_TABLET | Freq: Two times a day (BID) | ORAL | 2 refills | Status: DC

## 2018-08-01 ENCOUNTER — Other Ambulatory Visit: Payer: Self-pay | Admitting: Family Medicine

## 2018-08-03 NOTE — Telephone Encounter (Signed)
Filled until scheduled appointment Requested Prescriptions  Pending Prescriptions Disp Refills  . venlafaxine XR (EFFEXOR-XR) 75 MG 24 hr capsule [Pharmacy Med Name: VENLAFAXINE HCL ER 75 MG CAP] 90 capsule 0    Sig: TAKE 1 CAPSULE (75 MG TOTAL) BY MOUTH DAILY WITH BREAKFAST.     Psychiatry: Antidepressants - SNRI - desvenlafaxine & venlafaxine Failed - 08/01/2018  9:33 AM      Failed - Triglycerides in normal range and within 360 days    Triglycerides  Date Value Ref Range Status  07/09/2018 245 (H) 0 - 149 mg/dL Final         Passed - LDL in normal range and within 360 days    LDL Calculated  Date Value Ref Range Status  07/09/2018 72 0 - 99 mg/dL Final         Passed - Total Cholesterol in normal range and within 360 days    Cholesterol, Total  Date Value Ref Range Status  07/09/2018 172 100 - 199 mg/dL Final         Passed - Last BP in normal range    BP Readings from Last 1 Encounters:  07/09/18 128/88         Passed - Valid encounter within last 6 months    Recent Outpatient Visits          3 weeks ago Type 2 diabetes mellitus without complication, without long-term current use of insulin ([place])   [facility] at [facility], Lilia Argue, MD   4 months ago Type 2 diabetes mellitus without complication, without long-term current use of insulin [facility])   [facility] at [facility], Lilia Argue, MD   7 months ago Type 2 diabetes mellitus without complication, without long-term current use of insulin [facility])   Primary Care at [facility], Renette Butters, MD   9 months ago Type 2 diabetes mellitus without complication, without long-term current use of insulin [facility])   Primary Care at [facility], Renette Butters, MD   1 year ago Type 2 diabetes mellitus without complication, without long-term current use of insulin [facility])   Primary Care at [facility], Renette Butters, MD      Future Appointments            In 2 months Rutherford Guys, MD Primary Care at [place], [facility]

## 2018-08-22 ENCOUNTER — Other Ambulatory Visit: Payer: Self-pay | Admitting: Family Medicine

## 2018-08-24 NOTE — Telephone Encounter (Signed)
Requested medication (s) are due for refill today: no  Requested medication (s) are on the active medication list: yes  Last refill:  07/30/18 #60 2 RF  Future visit scheduled: yes 1 month  Notes to clinic:  Please have provider review:Jody Duke, Jody Argue, MD  to Jody Duke        6:28 PM  I am sorry patches are not staying on  Your insurance seems to cover buproprion and not chantix  Start buproprion 150mg  once a day for 1 week, then increase to twice a day, that is the desired dose  Quit all smoking once you have been at twice a day dose for a week  Usually use for 3 months  Let me know how it is working  I [place], MD     Requested Prescriptions  Pending Prescriptions Disp Refills   buPROPion (WELLBUTRIN SR) 150 MG 12 hr tablet [Pharmacy Med Name: BUPROPION HCL SR 150 MG TABLET] 180 tablet 1    Sig: TAKE 1 TABLET BY MOUTH TWICE A DAY     Psychiatry: Antidepressants - bupropion Passed - 08/22/2018  1:31 PM      Passed - Last BP in normal range    BP Readings from Last 1 Encounters:  07/09/18 128/88         Passed - Valid encounter within last 6 months    Recent Outpatient Visits          1 month ago Type 2 diabetes mellitus without complication, without long-term current use of insulin ([place])   [facility] at [facility], Jody Argue, MD   4 months ago Type 2 diabetes mellitus without complication, without long-term current use of insulin [facility])   [facility] at [facility], Jody Argue, MD   7 months ago Type 2 diabetes mellitus without complication, without long-term current use of insulin [facility])   Primary Care at [facility], Renette Butters, MD   10 months ago Type 2 diabetes mellitus without complication, without long-term current use of insulin [facility])   Primary Care at [facility], Renette Butters, MD   1 year ago Type 2 diabetes mellitus without complication, without long-term current use of insulin [facility])   Primary Care at [facility], Renette Butters, MD      Future  Appointments            In 1 month Rutherford Guys, MD Primary Care at [place], [facility]

## 2018-08-25 ENCOUNTER — Encounter: Payer: Self-pay | Admitting: Family Medicine

## 2018-08-25 ENCOUNTER — Telehealth: Payer: Self-pay

## 2018-08-25 ENCOUNTER — Other Ambulatory Visit: Payer: Self-pay

## 2018-08-25 NOTE — Telephone Encounter (Signed)
Pt is requesting change in smoking cessation meds, the patches are not sticking per pt

## 2018-09-05 ENCOUNTER — Other Ambulatory Visit: Payer: Self-pay | Admitting: Family Medicine

## 2018-09-22 ENCOUNTER — Other Ambulatory Visit: Payer: Self-pay | Admitting: Family Medicine

## 2018-09-25 ENCOUNTER — Encounter: Payer: Self-pay | Admitting: Family Medicine

## 2018-09-28 MED ORDER — ATORVASTATIN CALCIUM 10 MG PO TABS: 10.0000 mg | ORAL_TABLET | Freq: Every day | ORAL | 3 refills | Status: DC

## 2018-09-30 ENCOUNTER — Encounter: Payer: Self-pay | Admitting: Family Medicine

## 2018-09-30 ENCOUNTER — Other Ambulatory Visit: Payer: Self-pay | Admitting: Emergency Medicine

## 2018-10-08 ENCOUNTER — Telehealth (INDEPENDENT_AMBULATORY_CARE_PROVIDER_SITE_OTHER): Payer: 59 | Admitting: Family Medicine

## 2018-10-08 ENCOUNTER — Other Ambulatory Visit: Payer: Self-pay

## 2018-10-08 DIAGNOSIS — E1129 Type 2 diabetes mellitus with other diabetic kidney complication: Secondary | ICD-10-CM

## 2018-10-08 DIAGNOSIS — E11649 Type 2 diabetes mellitus with hypoglycemia without coma: Secondary | ICD-10-CM | POA: Diagnosis not present

## 2018-10-08 DIAGNOSIS — I10 Essential (primary) hypertension: Secondary | ICD-10-CM | POA: Diagnosis not present

## 2018-10-08 DIAGNOSIS — R809 Proteinuria, unspecified: Secondary | ICD-10-CM

## 2018-10-08 MED ORDER — LISINOPRIL 20 MG PO TABS: 20.0000 mg | ORAL_TABLET | Freq: Every day | ORAL | 1 refills | Status: DC

## 2018-10-08 NOTE — Progress Notes (Signed)
Chief Complaint: Pt states that her blood sugar is low 94 after eating 3 snack cakes and drinking O.J. Pt states that this morning she started feeling dizziness and sweating.

## 2018-10-08 NOTE — Progress Notes (Signed)
Virtual Visit via telephone Note  I connected with patient on 10/08/18 at 1000 by telephone and verified that I am speaking with the correct person using two identifiers. [place] and patient is currently with her during visit. The provider, Rutherford Guys, MD is located in their office at time of visit.  I discussed the limitations, risks, security and privacy concerns of performing an evaluation and management service by telephone and the availability of in person appointments. I also discussed with the patient that there may be a patient responsible charge related to this service. The patient expressed understanding and agreed to proceed.  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 Last OV Jan 2020 Started lisinopril and referred to nephrology  Having fasting lows, increasing in frequency, consistent for past several days, very symptomatic Takes metformin once a day in the morning Takes trulicity every Sunday Eating normal dinner  Has been checking BP at home She cant remember readings but they have been high Has not heard from nephrology  Has been smoking more  Lab Results  Component Value Date   HGBA1C 6.1 (A) 07/09/2018   HGBA1C 7.1 (A) 04/02/2018   HGBA1C 9.1 (A) 12/31/2017   Lab Results  Component Value Date   LDLCALC 72 07/09/2018   CREATININE 0.53 (L) 07/09/2018   Urine microalb/creatitine ratio:   1,303.2High      Fall Risk  10/08/2018 07/09/2018 04/02/2018 12/31/2017 10/27/2017  Falls in the past year? 0 0 No No No  Number falls in past yr: 0 - - - -  Injury with Fall? 0 - - - -  Follow up Falls evaluation completed - - - -     Depression screen [facility] 2/9 10/08/2018 07/09/2018 04/02/2018  Decreased Interest 0 0 0  Down, Depressed, Hopeless 0 0 0  PHQ - 2 Score 0 0 0     Allergies  Allergen Reactions  . Percocet [Oxycodone-Acetaminophen] Nausea And Vomiting    Prior to Admission medications   Medication Sig Start Date End Date Taking? Authorizing Provider  atorvastatin (LIPITOR) 10 MG tablet Take 1 tablet (10 mg total) by mouth daily. 09/28/18  Yes Rutherford Guys, MD  buPROPion [facility] SR) 150 MG 12 hr tablet TAKE 1 TABLET BY MOUTH TWICE A DAY 08/25/18  Yes Rutherford Guys, MD  Dulaglutide (TRULICITY) 8.67 EH/2.0NO SOPN INJECT 0.75 MG INTO THE SKIN ONCE A WEEK. 07/03/18  Yes [provider]  estradiol (ESTRACE) 0.5 MG tablet Take 1 tablet (0.5 mg total) by mouth daily. 06/26/18  Yes Fontaine, Belinda Block, MD  gabapentin (NEURONTIN) 600 MG tablet TAKE 1 TABLET (600 MG TOTAL) BY MOUTH AT BEDTIME. 09/07/18  Yes Rutherford Guys, MD  ibuprofen (ADVIL,MOTRIN) 800 MG tablet  07/13/18  Yes [provider]  lisinopril (PRINIVIL,ZESTRIL) 10 MG tablet Take 1 tablet (10 mg total) by mouth daily. 07/16/18  Yes Rutherford Guys, MD  metFORMIN (GLUCOPHAGE) 1000 MG tablet Take 1 tablet (1,000 mg total) by mouth 2 (two) times daily with a meal. 06/10/18  Yes Rutherford Guys, MD  nicotine (NICODERM CQ - DOSED IN MG/24 HOURS) 14 mg/24hr patch  07/10/18  Yes [provider]  TRULICITY 7.09 GG/8.3MO SOPN INJECT 0.75 MG INTO THE SKIN ONCE A WEEK. 09/22/18  Yes Rutherford Guys, MD  venlafaxine XR [facility])  75 MG 24 hr capsule TAKE 1 CAPSULE (75 MG TOTAL) BY MOUTH DAILY WITH BREAKFAST. 08/03/18  Yes Rutherford Guys, MD    Past Medical History:  Diagnosis Date  . Breast mass, left 07/2017  . Diabetes mellitus without complication ([place])   . Hyperlipidemia   . Migraines     Past Surgical History:  Procedure Laterality Date  . ANKLE SURGERY Left 10/15/2017  . BREAST BIOPSY Left 07/24/2017   Procedure: LEFT BREAST MASS EXCISIONAL BIOPSY;  Surgeon: Rolm Bookbinder, MD;  Location: [place];  Service: General;  Laterality: Left;  .  DEBULKING N/A 10/02/2017   Procedure: DEBULKING;  Surgeon: Isabel Caprice, MD;  [facility];  Service: Gynecology;  Laterality: N/A;  . FOOT GANGLION EXCISION Left    age 61  . HYSTERECTOMY ABDOMINAL WITH SALPINGO-OOPHORECTOMY Bilateral 10/02/2017   Procedure: HYSTERECTOMY ABDOMINAL WITH SALPINGO-OOPHORECTOMY;  Surgeon: Isabel Caprice, MD;  [facility];  Service: Gynecology;  Laterality: Bilateral;  bilateral salpingo-oophorectomy with possible total abdominal hysterectomy  . LAPAROTOMY WITH STAGING N/A 10/02/2017   Procedure: LAPAROTOMY WITH STAGING/OMENTECTOMY;  Surgeon: Isabel Caprice, MD;  [facility];  Service: Gynecology;  Laterality: N/A;  . RE-EXCISION OF BREAST LUMPECTOMY Left 08/25/2017   Procedure: LEFT RE-EXCISION OF BREAST MARGINS ERAS PATHWAY;  Surgeon: Rolm Bookbinder, MD;  Location: [place];  Service: General;  Laterality: Left;    Social History   Tobacco Use  . Smoking status: Current Every Day Smoker    Packs/day: 0.50    Years: 30.00    Pack years: 15.00    Types: Cigarettes  . Smokeless tobacco: Never Used  . Tobacco comment: 1 daily  Substance Use Topics  . Alcohol use: No    Frequency: Never    Family History  Problem Relation Age of Onset  . Hyperlipidemia Mother   . Alcohol abuse Mother   . Cirrhosis Mother   . Diabetes Father   . Hyperlipidemia Father   . Hypertension Father   . Stroke Father   . Heart disease Father 94       CAD with stenting multiple  . Hyperlipidemia Brother   . Hypertension Brother   . Diabetes Sister   . Hyperlipidemia Sister   . Hypertension Sister   . Breast cancer Maternal Grandmother        Post menopausal  . Pancreatic cancer Paternal Aunt   . Pancreatic cancer Paternal Aunt     ROS Per hpi  Objective  Vitals as reported by the patient: none  There were no vitals filed for this visit.  ASSESSMENT and PLAN  1. Hypoglycemia due to type 2 diabetes mellitus ([place]) Stop  metformin. Discussed mgt. Cont to check cbgs  2. Essential hypertension 3. Microalbuminuria due to type 2 diabetes mellitus (HCC) Increase lisinopril 20mg , goal is to maximize to 40mg  if tolerated.   Other orders - lisinopril (PRINIVIL,ZESTRIL) 20 MG tablet; Take 1 tablet (20 mg total) by mouth daily.  FOLLOW-UP: 2 weeks, hypoglycemia   The above assessment and management plan was discussed with the patient. The patient verbalized understanding of and has agreed to the management plan. Patient is aware to call the clinic if symptoms persist or worsen. Patient is aware when to return to the clinic for a follow-up visit. Patient educated on when it is appropriate to go to the emergency department.    I provided 17 minutes of non-face-to-face time during this encounter.  Rutherford Guys, MD Primary  Care at [facility], [place] [address] Ph.  [phone] Fax [phone]

## 2018-10-26 ENCOUNTER — Encounter: Payer: Self-pay | Admitting: Family Medicine

## 2018-10-26 ENCOUNTER — Telehealth (INDEPENDENT_AMBULATORY_CARE_PROVIDER_SITE_OTHER): Payer: 59 | Admitting: Family Medicine

## 2018-10-26 ENCOUNTER — Other Ambulatory Visit: Payer: Self-pay

## 2018-10-26 DIAGNOSIS — E11649 Type 2 diabetes mellitus with hypoglycemia without coma: Secondary | ICD-10-CM | POA: Diagnosis not present

## 2018-10-26 DIAGNOSIS — R809 Proteinuria, unspecified: Secondary | ICD-10-CM

## 2018-10-26 DIAGNOSIS — E7800 Pure hypercholesterolemia, unspecified: Secondary | ICD-10-CM

## 2018-10-26 DIAGNOSIS — E1129 Type 2 diabetes mellitus with other diabetic kidney complication: Secondary | ICD-10-CM

## 2018-10-26 DIAGNOSIS — I10 Essential (primary) hypertension: Secondary | ICD-10-CM

## 2018-10-26 MED ORDER — LOSARTAN POTASSIUM 50 MG PO TABS: 50.0000 mg | ORAL_TABLET | Freq: Every day | ORAL | 1 refills | Status: DC

## 2018-10-26 NOTE — Progress Notes (Signed)
Virtual Visit Note  I connected with patient on 10/26/18 at 915am by telephone and verified that I am speaking with the correct person using two identifiers. Jody Duke is currently located at home and patient is currently with them during visit. The provider, Rutherford Guys, MD is located in their office at time of visit.  I discussed the limitations, risks, security and privacy concerns of performing an evaluation and management service by telephone and the availability of in person appointments. I also discussed with the patient that there may be a patient responsible charge related to this service. The patient expressed understanding and agreed to proceed.   CC: followup hypoglycemia and BP  HPI ? 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  She has been having a bedtime snack She is eating better  She has been checking BP at home 130-150/80-90s Yesterday it was 156/90 Having dry cough since our last OV  Not interested in quiting at this time, too much stress  Allergies  Allergen Reactions  . Percocet [Oxycodone-Acetaminophen] Nausea And Vomiting    Prior to Admission medications   Medication Sig Start Date End Date Taking? Authorizing Provider  atorvastatin (LIPITOR) 10 MG tablet Take 1 tablet (10 mg total) by mouth daily. 09/28/18   Rutherford Guys, MD  buPROPion [facility] SR) 150 MG 12 hr tablet TAKE 1 TABLET BY MOUTH TWICE A DAY 08/25/18   Rutherford Guys, MD  Dulaglutide (TRULICITY) 9.37 DS/2.8JG SOPN INJECT 0.75 MG INTO THE SKIN ONCE A WEEK. 07/03/18   [provider]  estradiol (ESTRACE) 0.5 MG tablet Take 1 tablet (0.5 mg total) by  mouth daily. 06/26/18   Fontaine, Belinda Block, MD  gabapentin (NEURONTIN) 600 MG tablet TAKE 1 TABLET (600 MG TOTAL) BY MOUTH AT BEDTIME. 09/07/18   Rutherford Guys, MD  lisinopril (PRINIVIL,ZESTRIL) 20 MG tablet Take 1 tablet (20 mg total) by mouth daily. 10/08/18   Rutherford Guys, MD  TRULICITY 8.11 XB/2.6OM SOPN INJECT 0.75 MG INTO THE SKIN ONCE A WEEK. 09/22/18   Rutherford Guys, MD  venlafaxine XR (EFFEXOR-XR) 75 MG 24 hr capsule TAKE 1 CAPSULE (75 MG TOTAL) BY MOUTH DAILY WITH BREAKFAST. 08/03/18   Rutherford Guys, MD    Past Medical History:  Diagnosis Date  . Breast mass, left 07/2017  . Diabetes mellitus without complication ([place])   . Hyperlipidemia   . Migraines     Past Surgical History:  Procedure Laterality Date  . ANKLE SURGERY Left 10/15/2017  . BREAST BIOPSY Left 07/24/2017   Procedure: LEFT BREAST MASS EXCISIONAL BIOPSY;  Surgeon: Rolm Bookbinder, MD;  Location: [place];  Service: General;  Laterality: Left;  . DEBULKING N/A 10/02/2017   Procedure: DEBULKING;  Surgeon: Isabel Caprice, MD;  Location[facility];  Service: Gynecology;  Laterality: N/A;  . FOOT GANGLION EXCISION Left    age 48  . HYSTERECTOMY ABDOMINAL WITH SALPINGO-OOPHORECTOMY Bilateral 10/02/2017   Procedure: HYSTERECTOMY ABDOMINAL WITH SALPINGO-OOPHORECTOMY;  Surgeon: Isabel Caprice, MD;  [facility];  Service: Gynecology;  Laterality: Bilateral;  bilateral salpingo-oophorectomy with possible total abdominal hysterectomy  . LAPAROTOMY WITH STAGING N/A 10/02/2017   Procedure: LAPAROTOMY WITH STAGING/OMENTECTOMY;  Surgeon: Gerarda Fraction,  Francella Solian, MD;  [facility];  Service: Gynecology;  Laterality: N/A;  . RE-EXCISION OF BREAST LUMPECTOMY Left 08/25/2017   Procedure: LEFT RE-EXCISION OF BREAST MARGINS ERAS PATHWAY;  Surgeon: Rolm Bookbinder, MD;  Location: [place];  Service: General;  Laterality: Left;    Social History   Tobacco Use  . Smoking status: Current Every  Day Smoker    Packs/day: 0.50    Years: 30.00    Pack years: 15.00    Types: Cigarettes  . Smokeless tobacco: Never Used  . Tobacco comment: 1 daily  Substance Use Topics  . Alcohol use: No    Frequency: Never    Family History  Problem Relation Age of Onset  . Hyperlipidemia Mother   . Alcohol abuse Mother   . Cirrhosis Mother   . Diabetes Father   . Hyperlipidemia Father   . Hypertension Father   . Stroke Father   . Heart disease Father 86       CAD with stenting multiple  . Hyperlipidemia Brother   . Hypertension Brother   . Diabetes Sister   . Hyperlipidemia Sister   . Hypertension Sister   . Breast cancer Maternal Grandmother        Post menopausal  . Pancreatic cancer Paternal Aunt   . Pancreatic cancer Paternal Aunt     ROS  Per hpi Neg f/c, chest pain, palpitations, SOB, edema  Objective  Vitals as reported by the patient: per above   ASSESSMENT and PLAN  1. Hypoglycemia due to type 2 diabetes mellitus ([place]) DC trulicity, her last injection was yesterday. Cont monitoring cbgs. Most likely will reintroduce metformin once trulicity is not active anymore. Reviewed mgt of hypoglycemia. - Comprehensive metabolic panel  2. Essential hypertension Improved. Having cough with lisinopril. Changing to losartan. Cont home BP monitoring. Plan to titrate arb to max dose.  - Comprehensive metabolic panel  3. Microalbuminuria due to type 2 diabetes mellitus ([place]) See #2  4. Pure hypercholesterolemia Checking labs today, medications will be adjusted as needed.  - Comprehensive metabolic panel - Lipid panel; Future  Other orders - losartan (COZAAR) 50 MG tablet; Take 1 tablet (50 mg total) by mouth daily.  FOLLOW-UP: 3 weeks   The above assessment and management plan was discussed with the patient. The patient verbalized understanding of and has agreed to the management plan. Patient is aware to call the clinic if symptoms persist or worsen. Patient is aware  when to return to the clinic for a follow-up visit. Patient educated on when it is appropriate to go to the emergency department.    I provided 24 minutes of non-face-to-face time during this encounter.  Rutherford Guys, MD Primary Care at [facility] [place], [place] [address] Ph.  [phone] Fax [phone]

## 2018-10-26 NOTE — Addendum Note (Signed)
Addended by: Rutherford Guys on: 10/26/2018 09:41 AM   Modules accepted: Orders

## 2018-10-26 NOTE — Progress Notes (Signed)
Says blood sugar has been very low. Numbers have been as low as 85 when she wakes up in the morning. When this happens she feels nauseous, dizzy and has sweats. Also, has a dry cough, no concerns of the virus. Says she is still smoking and not taken the wellbutrin.  No refills needed at this time. Says she has ben stress with work. Does not want to stop smoking at this time

## 2018-10-27 DIAGNOSIS — R809 Proteinuria, unspecified: Secondary | ICD-10-CM | POA: Diagnosis not present

## 2018-10-27 DIAGNOSIS — I129 Hypertensive chronic kidney disease with stage 1 through stage 4 chronic kidney disease, or unspecified chronic kidney disease: Secondary | ICD-10-CM | POA: Diagnosis not present

## 2018-10-27 DIAGNOSIS — N182 Chronic kidney disease, stage 2 (mild): Secondary | ICD-10-CM | POA: Diagnosis not present

## 2018-11-05 ENCOUNTER — Other Ambulatory Visit: Payer: Self-pay | Admitting: Nephrology

## 2018-11-05 DIAGNOSIS — N182 Chronic kidney disease, stage 2 (mild): Secondary | ICD-10-CM

## 2018-11-10 ENCOUNTER — Ambulatory Visit
Admission: RE | Admit: 2018-11-10 | Discharge: 2018-11-10 | Disposition: A | Discharge status: Home / Self Care | Payer: 59 | Source: Ambulatory Visit | Attending: Nephrology | Admitting: Nephrology

## 2018-11-10 DIAGNOSIS — N182 Chronic kidney disease, stage 2 (mild): Secondary | ICD-10-CM

## 2018-11-13 ENCOUNTER — Ambulatory Visit: Payer: 59

## 2018-11-16 ENCOUNTER — Other Ambulatory Visit: Payer: Self-pay

## 2018-11-16 ENCOUNTER — Telehealth (INDEPENDENT_AMBULATORY_CARE_PROVIDER_SITE_OTHER): Payer: 59 | Admitting: Family Medicine

## 2018-11-16 ENCOUNTER — Encounter: Payer: Self-pay | Admitting: Family Medicine

## 2018-11-16 DIAGNOSIS — M79609 Pain in unspecified limb: Secondary | ICD-10-CM

## 2018-11-16 DIAGNOSIS — R202 Paresthesia of skin: Secondary | ICD-10-CM

## 2018-11-16 DIAGNOSIS — E1165 Type 2 diabetes mellitus with hyperglycemia: Secondary | ICD-10-CM

## 2018-11-16 MED ORDER — METFORMIN HCL 1000 MG PO TABS: 1000.0000 mg | ORAL_TABLET | Freq: Two times a day (BID) | ORAL | 1 refills | Status: DC

## 2018-11-16 MED ORDER — GABAPENTIN 300 MG PO CAPS: 300.0000 mg | ORAL_CAPSULE | Freq: Three times a day (TID) | ORAL | 3 refills | Status: DC

## 2018-11-16 NOTE — Progress Notes (Signed)
Virtual Visit Note  I connected with patient on 11/16/18 at 503pm by phone and verified that I am speaking with the correct person using two identifiers. Jody Duke is currently located at home and patient is currently with them during visit. The provider, Rutherford Guys, MD is located in their office at time of visit.  I discussed the limitations, risks, security and privacy concerns of performing an evaluation and management service by telephone and the availability of in person appointments. I also discussed with the patient that there may be a patient responsible charge related to this service. The patient expressed understanding and agreed to proceed.   CC: high sugars  HPI ? 48yo F with PMH [id number] complications of neuropathy and microalbuminuria, HLP, post-menopausal hotflasheswho presents today forfollowup on hypoglycemia  Last OV a couple of weeks Dc trulicty due to recurrent hypoglycemia cbgs have now gone high > 200 BP 138/84, 132/78, 125/72 Cough better with change to losartan  Having tingling/vibrating pain that starts near collar bone/shoulder x1 week Sometimes goes down to her finger, 1-4 No neck pain Denies any inciting event Feels has lost strength but not dropping objects LEFT handed Taking gabapentin TID for ankle    Allergies  Allergen Reactions  . Percocet [Oxycodone-Acetaminophen] Nausea And Vomiting    Prior to Admission medications   Medication Sig Start Date End Date Taking? Authorizing Provider  atorvastatin (LIPITOR) 10 MG tablet Take 1 tablet (10 mg total) by mouth daily. 09/28/18   Rutherford Guys, MD  buPROPion [facility] SR) 150 MG 12 hr tablet TAKE 1 TABLET BY MOUTH TWICE A DAY 08/25/18   Rutherford Guys, MD  estradiol (ESTRACE) 0.5 MG tablet Take 1 tablet (0.5 mg total) by mouth daily. 06/26/18   Fontaine, Belinda Block, MD  gabapentin (NEURONTIN) 600 MG tablet TAKE 1 TABLET (600 MG TOTAL) BY MOUTH AT BEDTIME. 09/07/18   Rutherford Guys, MD  losartan (COZAAR) 50 MG tablet Take 1 tablet (50 mg total) by mouth daily. 10/26/18   Rutherford Guys, MD  venlafaxine XR (EFFEXOR-XR) 75 MG 24 hr capsule TAKE 1 CAPSULE (75 MG TOTAL) BY MOUTH DAILY WITH BREAKFAST. 08/03/18   Rutherford Guys, MD    Past Medical History:  Diagnosis Date  . Breast mass, left 07/2017  . Diabetes mellitus without complication ([place])   . Hyperlipidemia   . Migraines     Past Surgical History:  Procedure Laterality Date  . ANKLE SURGERY Left 10/15/2017  . BREAST BIOPSY Left 07/24/2017   Procedure: LEFT BREAST MASS EXCISIONAL BIOPSY;  Surgeon: Rolm Bookbinder, MD;  Location: [facility];  Service: General;  Laterality: Left;  . DEBULKING N/A 10/02/2017   Procedure: DEBULKING;  Surgeon: Isabel Caprice, MD;  [facility];  Service: Gynecology;  Laterality: N/A;  . FOOT GANGLION EXCISION Left    age 48  . HYSTERECTOMY ABDOMINAL WITH SALPINGO-OOPHORECTOMY Bilateral 10/02/2017   Procedure: HYSTERECTOMY ABDOMINAL WITH SALPINGO-OOPHORECTOMY;  Surgeon: Isabel Caprice, MD;  [facility];  Service: Gynecology;  Laterality: Bilateral;  bilateral salpingo-oophorectomy with possible total abdominal hysterectomy  . LAPAROTOMY WITH STAGING N/A 10/02/2017   Procedure: LAPAROTOMY WITH STAGING/OMENTECTOMY;  Surgeon: Isabel Caprice, MD;  [facility];  Service: Gynecology;  Laterality: N/A;  . RE-EXCISION OF BREAST LUMPECTOMY Left 08/25/2017   Procedure: LEFT RE-EXCISION OF BREAST MARGINS ERAS PATHWAY;  Surgeon: Rolm Bookbinder, MD;  Location: [facility];  Service: General;  Laterality: Left;  Social History   Tobacco Use  . Smoking status: Current Every Day Smoker    Packs/day: 0.50    Years: 30.00    Pack years: 15.00    Types: Cigarettes  . Smokeless tobacco: Never Used  . Tobacco comment: 1 daily  Substance Use Topics  . Alcohol use: No    Frequency: Never    Family History  Problem Relation Age of Onset  .  Hyperlipidemia Mother   . Alcohol abuse Mother   . Cirrhosis Mother   . Diabetes Father   . Hyperlipidemia Father   . Hypertension Father   . Stroke Father   . Heart disease Father 35       CAD with stenting multiple  . Hyperlipidemia Brother   . Hypertension Brother   . Diabetes Sister   . Hyperlipidemia Sister   . Hypertension Sister   . Breast cancer Maternal Grandmother        Post menopausal  . Pancreatic cancer Paternal Aunt   . Pancreatic cancer Paternal Aunt     ROS Per hpi  Objective  Vitals as reported by the patient: none   ASSESSMENT and PLAN  1. Uncontrolled type 2 diabetes mellitus with hyperglycemia (HCC) Restarting metformin. Hypoglycemia related to trulicity. Consider further increasing losartan due to microalbuminuria, patient will bring BP logs at next visit.  2. Paresthesia and pain of right extremity - Ambulatory referral to Sports Medicine  Other orders - metFORMIN (GLUCOPHAGE) 1000 MG tablet; Take 1 tablet (1,000 mg total) by mouth 2 (two) times daily with a meal. - gabapentin (NEURONTIN) 300 MG capsule; Take 1 capsule (300 mg total) by mouth 3 (three) times daily.  FOLLOW-UP: 4 weeks, labs 2-3 days prior, in office   The above assessment and management plan was discussed with the patient. The patient verbalized understanding of and has agreed to the management plan. Patient is aware to call the clinic if symptoms persist or worsen. Patient is aware when to return to the clinic for a follow-up visit. Patient educated on when it is appropriate to go to the emergency department.    I provided 15 minutes of non-face-to-face time during this encounter.  Rutherford Guys, MD Primary Care at [facility] [place], [place] [address] Ph.  [phone] Fax [phone]

## 2018-11-18 NOTE — Telephone Encounter (Signed)
Discussed during appt

## 2018-11-24 ENCOUNTER — Other Ambulatory Visit: Payer: Self-pay | Admitting: Family Medicine

## 2018-11-24 ENCOUNTER — Ambulatory Visit (INDEPENDENT_AMBULATORY_CARE_PROVIDER_SITE_OTHER): Payer: 59 | Admitting: Family Medicine

## 2018-11-24 ENCOUNTER — Encounter: Payer: Self-pay | Admitting: Family Medicine

## 2018-11-24 ENCOUNTER — Other Ambulatory Visit: Payer: Self-pay

## 2018-11-24 VITALS — BP 126/80 | Ht 61.0 in | Wt 190.0 lb

## 2018-11-24 DIAGNOSIS — R200 Anesthesia of skin: Secondary | ICD-10-CM

## 2018-11-24 DIAGNOSIS — S46811A Strain of other muscles, fascia and tendons at shoulder and upper arm level, right arm, initial encounter: Secondary | ICD-10-CM

## 2018-11-24 MED ORDER — DICLOFENAC SODIUM 75 MG PO TBEC: 75.0000 mg | DELAYED_RELEASE_TABLET | Freq: Two times a day (BID) | ORAL | 1 refills | Status: DC

## 2018-11-24 NOTE — Patient Instructions (Signed)
You have some trapezius spasms with peripheral nerve irritation. Consider prednisone dose pack. Diclofenac 75mg  twice a day with food for pain and inflammation - do not take aleve or ibuprofen while on this. Continue your gabapentin. Start physical therapy for stretching, exercises, traction, and modalities. Heat 15 minutes at a time 3-4 times a day to help with spasms if needed. Watch head position when on computers, texting, when sleeping in bed - should in line with back to prevent further nerve traction and irritation. Consider home traction unit if you get benefit with this in physical therapy. Follow up with me in 5-6 weeks.

## 2018-11-24 NOTE — Progress Notes (Signed)
PCP: Rutherford Guys, MD  Subjective:   HPI: Patient is a 48 y.o. female here for right arm tingling.  Patient reports for about 2 weeks she's had a numbness/tingling in right side of chest and shoulder down arm into all fingers. No acute injury or trauma. Has diabetes but last a1c was 6.1 in January. No neck pain. No bowel/bladder dysfunction. Feels like right arm is weak at times. Pain can be sharp in this distribution up to 10/10 level. No skin changes.  Past Medical History:  Diagnosis Date  . Breast mass, left 07/2017  . Diabetes mellitus without complication ([place])   . Hyperlipidemia   . Migraines     Current Outpatient Medications on File Prior to Visit  Medication Sig Dispense Refill  . atorvastatin (LIPITOR) 10 MG tablet Take 1 tablet (10 mg total) by mouth daily. 90 tablet 3  . estradiol (ESTRACE) 0.5 MG tablet Take 1 tablet (0.5 mg total) by mouth daily. 90 tablet 3  . gabapentin (NEURONTIN) 300 MG capsule Take 1 capsule (300 mg total) by mouth 3 (three) times daily. 90 capsule 3  . losartan (COZAAR) 50 MG tablet Take 1 tablet (50 mg total) by mouth daily. 90 tablet 1  . metFORMIN (GLUCOPHAGE) 1000 MG tablet Take 1 tablet (1,000 mg total) by mouth 2 (two) times daily with a meal. 180 tablet 1  . venlafaxine XR (EFFEXOR-XR) 75 MG 24 hr capsule TAKE 1 CAPSULE (75 MG TOTAL) BY MOUTH DAILY WITH BREAKFAST. 90 capsule 0   No current facility-administered medications on file prior to visit.     Past Surgical History:  Procedure Laterality Date  . ANKLE SURGERY Left 10/15/2017  . BREAST BIOPSY Left 07/24/2017   Procedure: LEFT BREAST MASS EXCISIONAL BIOPSY;  Surgeon: Rolm Bookbinder, MD;  Location: [facility];  Service: General;  Laterality: Left;  . DEBULKING N/A 10/02/2017   Procedure: DEBULKING;  Surgeon: Isabel Caprice, MD;  [facility];  Service: Gynecology;  Laterality: N/A;  . FOOT GANGLION EXCISION Left    age 77  . HYSTERECTOMY ABDOMINAL  WITH SALPINGO-OOPHORECTOMY Bilateral 10/02/2017   Procedure: HYSTERECTOMY ABDOMINAL WITH SALPINGO-OOPHORECTOMY;  Surgeon: Isabel Caprice, MD;  [facility];  Service: Gynecology;  Laterality: Bilateral;  bilateral salpingo-oophorectomy with possible total abdominal hysterectomy  . LAPAROTOMY WITH STAGING N/A 10/02/2017   Procedure: LAPAROTOMY WITH STAGING/OMENTECTOMY;  Surgeon: Isabel Caprice, MD;  [facility];  Service: Gynecology;  Laterality: N/A;  . RE-EXCISION OF BREAST LUMPECTOMY Left 08/25/2017   Procedure: LEFT RE-EXCISION OF BREAST MARGINS ERAS PATHWAY;  Surgeon: Rolm Bookbinder, MD;  Location: [facility];  Service: General;  Laterality: Left;    Allergies  Allergen Reactions  . Percocet [Oxycodone-Acetaminophen] Nausea And Vomiting    Social History   Socioeconomic History  . Marital status: Single    Spouse name: Not on file  . Number of children: Not on file  . Years of education: Not on file  . Highest education level: Not on file  Occupational History  . Not on file  Social Needs  . Financial resource strain: Not on file  . Food insecurity:    Worry: Not on file    Inability: Not on file  . Transportation needs:    Medical: Not on file    Non-medical: Not on file  Tobacco Use  . Smoking status: Current Every Day Smoker    Packs/day: 0.50    Years: 30.00    Pack years: 15.00  Types: Cigarettes  . Smokeless tobacco: Never Used  . Tobacco comment: 1 daily  Substance and Sexual Activity  . Alcohol use: No    Frequency: Never  . Drug use: No  . Sexual activity: Not Currently    Comment: 1st intercourse 48 yo-More than 5 partners  Lifestyle  . Physical activity:    Days per week: Not on file    Minutes per session: Not on file  . Stress: Not on file  Relationships  . Social connections:    Talks on phone: Not on file    Gets together: Not on file    Attends religious service: Not on file    Active member of club or  organization: Not on file    Attends meetings of clubs or organizations: Not on file    Relationship status: Not on file  . Intimate partner violence:    Fear of current or ex partner: Not on file    Emotionally abused: Not on file    Physically abused: Not on file    Forced sexual activity: Not on file  Other Topics Concern  . Not on file  Social History Narrative   Marital status: single; girlfriend x 3 years; from [place]; moved [place] in 2015      Children: none      Lives: with girlfriend      Employment: works at [employer]; runs seasonal department      Tobacco: 1.5ppd x age 73.  Never quit.      Alcohol: None; rare      Drugs: week as teenager      Exercise: none      Sexual activity: females since 38; males prior.  One STD/Gonorrhea.    Family History  Problem Relation Age of Onset  . Hyperlipidemia Mother   . Alcohol abuse Mother   . Cirrhosis Mother   . Diabetes Father   . Hyperlipidemia Father   . Hypertension Father   . Stroke Father   . Heart disease Father 85       CAD with stenting multiple  . Hyperlipidemia Brother   . Hypertension Brother   . Diabetes Sister   . Hyperlipidemia Sister   . Hypertension Sister   . Breast cancer Maternal Grandmother        Post menopausal  . Pancreatic cancer Paternal Aunt   . Pancreatic cancer Paternal Aunt     BP 126/80   Ht 5\' 1"  (1.549 m)   Wt 190 lb (86.2 kg)   LMP 09/21/2017 (Exact Date)   BMI 35.90 kg/m   Review of Systems: See HPI above.     Objective:  Physical Exam:  Gen: NAD, comfortable in exam room  Neck: No gross deformity, swelling, bruising. TTP mildly right trapezius.  No midline/bony TTP. FROM without pain. BUE strength 5/5.   Sensation right hand throughout 'feels different'.   Trace equal reflexes in biceps, brachioradialis tendons, 2+ triceps. 2+ radial pulses. Negative adson's.  Right shoulder: No swelling, ecchymoses.  No gross deformity. No TTP. FROM. Strength 5/5 with empty  can and resisted internal/external rotation. NV intact distally.  Left shoulder: No swelling, ecchymoses.  No gross deformity. No TTP. FROM. Strength 5/5 with empty can and resisted internal/external rotation. NV intact distally.   Assessment & Plan:  1. Right arm numbness - patient's exam is reassuring.  Distribution is somewhat nonanatomic - noted in chest, arm, and all digits of right hand.  She does have some trapezius tenderness/spasms  and may have peripheral nerve irritation related to brachial plexus accounting for symptoms.  Will treat with diclofenac, physical therapy.  Ergonomic issues discussed.  Continue gabapentin.  F/u in 5-6 weeks.

## 2018-12-08 ENCOUNTER — Other Ambulatory Visit: Payer: Self-pay | Admitting: Family Medicine

## 2018-12-11 ENCOUNTER — Ambulatory Visit: Payer: 59

## 2018-12-11 ENCOUNTER — Encounter: Payer: Self-pay | Admitting: Family Medicine

## 2018-12-16 ENCOUNTER — Other Ambulatory Visit: Payer: Self-pay

## 2018-12-16 ENCOUNTER — Encounter: Payer: Self-pay | Admitting: Family Medicine

## 2018-12-16 ENCOUNTER — Ambulatory Visit (INDEPENDENT_AMBULATORY_CARE_PROVIDER_SITE_OTHER): Payer: 59 | Admitting: Family Medicine

## 2018-12-16 ENCOUNTER — Other Ambulatory Visit: Payer: Self-pay | Admitting: Family Medicine

## 2018-12-16 VITALS — BP 120/80 | HR 101 | Temp 98.3°F | Ht 61.0 in | Wt 206.0 lb

## 2018-12-16 DIAGNOSIS — R809 Proteinuria, unspecified: Secondary | ICD-10-CM

## 2018-12-16 DIAGNOSIS — E7800 Pure hypercholesterolemia, unspecified: Secondary | ICD-10-CM

## 2018-12-16 DIAGNOSIS — I10 Essential (primary) hypertension: Secondary | ICD-10-CM | POA: Diagnosis not present

## 2018-12-16 DIAGNOSIS — E1129 Type 2 diabetes mellitus with other diabetic kidney complication: Secondary | ICD-10-CM | POA: Diagnosis not present

## 2018-12-16 LAB — POCT GLYCOSYLATED HEMOGLOBIN (HGB A1C): Hemoglobin A1C: 7 % — AB (ref 4.0–5.6)

## 2018-12-16 MED ORDER — GABAPENTIN 300 MG PO CAPS: 300.0000 mg | ORAL_CAPSULE | Freq: Two times a day (BID) | ORAL | 3 refills | Status: DC

## 2018-12-16 MED ORDER — DAPAGLIFLOZIN PROPANEDIOL 5 MG PO TABS: 5.0000 mg | ORAL_TABLET | Freq: Every day | ORAL | 3 refills | Status: DC

## 2018-12-16 NOTE — Patient Instructions (Signed)
° ° ° °  If you have lab work done today you will be contacted with your lab results within the next 2 weeks.  If you have not heard from us then please contact us. The fastest way to get your results is to register for My Chart. ° ° °IF you received an x-ray today, you will receive an invoice from [facility]. Please contact [facility] at [phone] with questions or concerns regarding your invoice.  ° °IF you received labwork today, you will receive an invoice from [facility]. Please contact [facility] at [phone] with questions or concerns regarding your invoice.  ° °Our billing staff will not be able to assist you with questions regarding bills from these companies. ° °You will be contacted with the lab results as soon as they are available. The fastest way to get your results is to activate your My Chart account. Instructions are located on the last page of this paperwork. If you have not heard from us regarding the results in 2 weeks, please contact this office. °  ° ° ° °

## 2018-12-16 NOTE — Progress Notes (Signed)
6/17/20203:51 PM  Jody Duke 12-13-1970, 48 y.o., female [id number]  Chief Complaint  Patient presents with  . Hypertension  . Diabetes    HPI:   Patient is a 48 y.o. female with past medical history significant for [id number] complications of neuropathy and microalbuminuria, HLP, post-menopausal hot flashes who presents today for routine followup  Last OV May 2020 - virtual visit Restarted metformin 500mg  BID trulicity was causing hypoglycemia Checking cbgs fasting and through out the day 120-180s Denies any lows Has gained weight since stopped trulicity Taking gabapentin QID Trying to eat healthier, using air fryer, coconut oil, more salads  Wt Readings from Last 3 Encounters:  12/16/18 206 lb (93.4 kg)  11/24/18 190 lb (86.2 kg)  07/09/18 190 lb 9.6 oz (86.5 kg)    Having increased stress at work, just had covid positive coworker  Lab Results  Component Value Date   HGBA1C 6.1 (A) 07/09/2018   HGBA1C 7.1 (A) 04/02/2018   HGBA1C 9.1 (A) 12/31/2017   Lab Results  Component Value Date   LDLCALC 72 07/09/2018   CREATININE 0.53 (L) 07/09/2018    Fall Risk  10/26/2018 10/08/2018 07/09/2018 04/02/2018 12/31/2017  Falls in the past year? 0 0 0 No No  Number falls in past yr: 0 0 - - -  Injury with Fall? 0 0 - - -  Follow up - Falls evaluation completed - - -     Depression screen [facility] 2/9 10/26/2018 10/08/2018 07/09/2018  Decreased Interest 0 0 0  Down, Depressed, Hopeless 0 0 0  PHQ - 2 Score 0 0 0    Allergies  Allergen Reactions  . Percocet [Oxycodone-Acetaminophen] Nausea And Vomiting    Prior to Admission medications   Medication Sig Start Date End Date Taking? Authorizing Provider  atorvastatin (LIPITOR) 10 MG tablet Take 1 tablet (10 mg total) by mouth daily. 09/28/18  Yes Rutherford Guys, MD  diclofenac (VOLTAREN) 75 MG EC tablet Take 1 tablet (75 mg total) by mouth 2 (two) times daily. 11/24/18  Yes Hudnall, Sharyn Lull, MD  estradiol (ESTRACE) 0.5 MG tablet Take  1 tablet (0.5 mg total) by mouth daily. 06/26/18  Yes Fontaine, Belinda Block, MD  gabapentin (NEURONTIN) 300 MG capsule Take 1 capsule (300 mg total) by mouth 3 (three) times daily. 11/16/18  Yes Rutherford Guys, MD  losartan (COZAAR) 50 MG tablet Take 1 tablet (50 mg total) by mouth daily. 10/26/18  Yes Rutherford Guys, MD  metFORMIN (GLUCOPHAGE) 1000 MG tablet Take 1 tablet (1,000 mg total) by mouth 2 (two) times daily with a meal. 11/16/18  Yes Rutherford Guys, MD  venlafaxine XR (EFFEXOR-XR) 75 MG 24 hr capsule TAKE 1 CAPSULE (75 MG TOTAL) BY MOUTH DAILY WITH BREAKFAST. 12/08/18  Yes Rutherford Guys, MD    Past Medical History:  Diagnosis Date  . Breast mass, left 07/2017  . Diabetes mellitus without complication ([place])   . Hyperlipidemia   . Migraines     Past Surgical History:  Procedure Laterality Date  . ANKLE SURGERY Left 10/15/2017  . BREAST BIOPSY Left 07/24/2017   Procedure: LEFT BREAST MASS EXCISIONAL BIOPSY;  Surgeon: Rolm Bookbinder, MD;  Location: [place];  Service: General;  Laterality: Left;  . DEBULKING N/A 10/02/2017   Procedure: DEBULKING;  Surgeon: Isabel Caprice, MD;  Location: [facility];  Service: Gynecology;  Laterality: N/A;  . FOOT GANGLION EXCISION Left    age 63  . HYSTERECTOMY ABDOMINAL WITH SALPINGO-OOPHORECTOMY  Bilateral 10/02/2017   Procedure: HYSTERECTOMY ABDOMINAL WITH SALPINGO-OOPHORECTOMY;  Surgeon: Isabel Caprice, MD;  [facility];  Service: Gynecology;  Laterality: Bilateral;  bilateral salpingo-oophorectomy with possible total abdominal hysterectomy  . LAPAROTOMY WITH STAGING N/A 10/02/2017   Procedure: LAPAROTOMY WITH STAGING/OMENTECTOMY;  Surgeon: Isabel Caprice, MD;  [facility];  Service: Gynecology;  Laterality: N/A;  . RE-EXCISION OF BREAST LUMPECTOMY Left 08/25/2017   Procedure: LEFT RE-EXCISION OF BREAST MARGINS ERAS PATHWAY;  Surgeon: Rolm Bookbinder, MD;  Location: [place];  Service: General;   Laterality: Left;    Social History   Tobacco Use  . Smoking status: Current Every Day Smoker    Packs/day: 0.50    Years: 30.00    Pack years: 15.00    Types: Cigarettes  . Smokeless tobacco: Never Used  . Tobacco comment: 1 daily  Substance Use Topics  . Alcohol use: No    Frequency: Never    Family History  Problem Relation Age of Onset  . Hyperlipidemia Mother   . Alcohol abuse Mother   . Cirrhosis Mother   . Diabetes Father   . Hyperlipidemia Father   . Hypertension Father   . Stroke Father   . Heart disease Father 52       CAD with stenting multiple  . Hyperlipidemia Brother   . Hypertension Brother   . Diabetes Sister   . Hyperlipidemia Sister   . Hypertension Sister   . Breast cancer Maternal Grandmother        Post menopausal  . Pancreatic cancer Paternal Aunt   . Pancreatic cancer Paternal Aunt     Review of Systems  Constitutional: Negative for chills and fever.  Respiratory: Negative for cough and shortness of breath.   Cardiovascular: Negative for chest pain, palpitations and leg swelling.  Gastrointestinal: Negative for abdominal pain, nausea and vomiting.     OBJECTIVE:  Today's Vitals   12/16/18 1547  BP: 120/80  Pulse: (!) 101  Temp: 98.3 F (36.8 C)  TempSrc: Oral  SpO2: 97%  Weight: 206 lb (93.4 kg)  Height: 5\' 1"  (1.549 m)   Body mass index is 38.92 kg/m.   Physical Exam Vitals signs and nursing note reviewed.  Constitutional:      Appearance: She is well-developed.  HENT:     Head: Normocephalic and atraumatic.     Mouth/Throat:     Pharynx: No oropharyngeal exudate.  Eyes:     General: No scleral icterus.    Conjunctiva/sclera: Conjunctivae normal.     Pupils: Pupils are equal, round, and reactive to light.  Neck:     Musculoskeletal: Neck supple.  Cardiovascular:     Rate and Rhythm: Normal rate and regular rhythm.     Heart sounds: Normal heart sounds. No murmur. No friction rub. No gallop.   Pulmonary:      Effort: Pulmonary effort is normal.     Breath sounds: Normal breath sounds. No wheezing or rales.  Skin:    General: Skin is warm and dry.  Neurological:     Mental Status: She is alert and oriented to person, place, and time.     Results for orders placed or performed in visit on 12/16/18 (from the past 24 hour(s))  POCT A1C     Status: Abnormal   Collection Time: 12/16/18  4:07 PM  Result Value Ref Range   Hemoglobin A1C 7.0 (A) 4.0 - 5.6 %   HbA1c POC (<> result, manual entry)  HbA1c, POC (prediabetic range)     HbA1c, POC (controlled diabetic range)      No results found.   ASSESSMENT and PLAN  1. Type 2 diabetes mellitus with microalbuminuria, without long-term current use of insulin (HCC) a1c increased. Adding low dose farxiga, reviewed r/se/b. Continue working on [employer]. Advised against use of coconut oil. Decrease gabapentin to BID as might be contributing to weight gain. Declines referral to medical weight loss program at this time.  - TSH - POCT A1C  2. Essential hypertension Controlled. Continue current regime.  - TSH - Comprehensive metabolic panel  3. Pure hypercholesterolemia Checking labs today, medications will be adjusted as needed.  - Comprehensive metabolic panel - Lipid panel  Other orders - dapagliflozin propanediol (FARXIGA) 5 MG TABS tablet; Take 5 mg by mouth daily. - gabapentin (NEURONTIN) 300 MG capsule; Take 1 capsule (300 mg total) by mouth 2 (two) times daily.  Return in about 3 months (around 03/18/2019).    Rutherford Guys, MD [facility] at [facility] [place], [place] [address] Ph.  [phone] Fax [phone]

## 2018-12-17 LAB — COMPREHENSIVE METABOLIC PANEL
ALT: 28 IU/L (ref 0–32)
AST: 18 IU/L (ref 0–40)
Albumin/Globulin Ratio: 1.6 (ref 1.2–2.2)
Albumin: 3.6 g/dL — ABNORMAL LOW (ref 3.8–4.8)
Alkaline Phosphatase: 83 IU/L (ref 39–117)
BUN/Creatinine Ratio: 29 — ABNORMAL HIGH (ref 9–23)
BUN: 16 mg/dL (ref 6–24)
Bilirubin Total: 0.2 mg/dL (ref 0.0–1.2)
CO2: 20 mmol/L (ref 20–29)
Calcium: 9.1 mg/dL (ref 8.7–10.2)
Chloride: 107 mmol/L — ABNORMAL HIGH (ref 96–106)
Creatinine, Ser: 0.56 mg/dL — ABNORMAL LOW (ref 0.57–1.00)
GFR calc Af Amer: 129 mL/min/{1.73_m2} (ref >59)
GFR calc non Af Amer: 112 mL/min/{1.73_m2} (ref >59)
Globulin, Total: 2.2 g/dL (ref 1.5–4.5)
Glucose: 237 mg/dL — ABNORMAL HIGH (ref 65–99)
Potassium: 4.3 mmol/L (ref 3.5–5.2)
Sodium: 142 mmol/L (ref 134–144)
Total Protein: 5.8 g/dL — ABNORMAL LOW (ref 6.0–8.5)

## 2018-12-17 LAB — LIPID PANEL
Chol/HDL Ratio: 5.3 ratio — ABNORMAL HIGH (ref 0.0–4.4)
Cholesterol, Total: 217 mg/dL — ABNORMAL HIGH (ref 100–199)
HDL: 41 mg/dL (ref >39)
Triglycerides: 492 mg/dL — ABNORMAL HIGH (ref 0–149)

## 2018-12-17 LAB — TSH: TSH: 2.93 u[IU]/mL (ref 0.450–4.500)

## 2018-12-18 ENCOUNTER — Other Ambulatory Visit: Payer: Self-pay | Admitting: Family Medicine

## 2018-12-18 MED ORDER — ATORVASTATIN CALCIUM 40 MG PO TABS: 40.0000 mg | ORAL_TABLET | Freq: Every day | ORAL | 3 refills | Status: DC

## 2018-12-20 ENCOUNTER — Encounter: Payer: Self-pay | Admitting: Family Medicine

## 2018-12-22 ENCOUNTER — Ambulatory Visit (INDEPENDENT_AMBULATORY_CARE_PROVIDER_SITE_OTHER): Payer: 59 | Admitting: Family Medicine

## 2018-12-22 ENCOUNTER — Other Ambulatory Visit: Payer: Self-pay

## 2018-12-22 ENCOUNTER — Ambulatory Visit
Admission: RE | Admit: 2018-12-22 | Discharge: 2018-12-22 | Disposition: A | Discharge status: Home / Self Care | Payer: 59 | Source: Ambulatory Visit | Attending: Family Medicine | Admitting: Family Medicine

## 2018-12-22 VITALS — BP 130/80 | Ht 61.0 in | Wt 206.0 lb

## 2018-12-22 DIAGNOSIS — M542 Cervicalgia: Secondary | ICD-10-CM

## 2018-12-22 NOTE — Patient Instructions (Signed)
Get x-rays of your neck after you leave today at [facility]. We will contact you with the results and set up an MRI of your cervical spine. Go ahead with the physical therapy. Continue gabapentin - work with Dr. Pamella Pert to continue titrating this up. Nerve conduction studies are a consideration depending on the MRI results. Follow up with me after the MRI to go over this and next steps in a no charge visit.

## 2018-12-23 ENCOUNTER — Encounter: Payer: Self-pay | Admitting: Family Medicine

## 2018-12-23 NOTE — Progress Notes (Signed)
PCP: Rutherford Guys, MD  Subjective:   HPI: Patient is a 48 y.o. female here for right arm tingling.  5/26: Patient reports for about 2 weeks she's had a numbness/tingling in right side of chest and shoulder down arm into all fingers. No acute injury or trauma. Has diabetes but last a1c was 6.1 in January. No neck pain. No bowel/bladder dysfunction. Feels like right arm is weak at times. Pain can be sharp in this distribution up to 10/10 level. No skin changes.  6/23: Patient reports pain is a little worse compared to last visit. Still up to 10/10 level and sharp from neck radiating into right arm into all fingers. Associated numbness and tingling in same distribution. Doing home exercises/stretches; starts physical therapy this week also. Taking gabapentin, diclofenac without much relief. No skin changes.  Past Medical History:  Diagnosis Date  . Breast mass, left 07/2017  . Diabetes mellitus without complication ([place])   . Hyperlipidemia   . Migraines     Current Outpatient Medications on File Prior to Visit  Medication Sig Dispense Refill  . atorvastatin (LIPITOR) 40 MG tablet Take 1 tablet (40 mg total) by mouth daily. 90 tablet 3  . dapagliflozin propanediol (FARXIGA) 5 MG TABS tablet Take 5 mg by mouth daily. 30 tablet 3  . diclofenac (VOLTAREN) 75 MG EC tablet Take 1 tablet (75 mg total) by mouth 2 (two) times daily. 60 tablet 1  . estradiol (ESTRACE) 0.5 MG tablet Take 1 tablet (0.5 mg total) by mouth daily. 90 tablet 3  . gabapentin (NEURONTIN) 300 MG capsule Take 1 capsule (300 mg total) by mouth 2 (two) times daily. 90 capsule 3  . losartan (COZAAR) 50 MG tablet Take 1 tablet (50 mg total) by mouth daily. 90 tablet 1  . metFORMIN (GLUCOPHAGE) 1000 MG tablet Take 1 tablet (1,000 mg total) by mouth 2 (two) times daily with a meal. 180 tablet 1  . venlafaxine XR (EFFEXOR-XR) 75 MG 24 hr capsule TAKE 1 CAPSULE (75 MG TOTAL) BY MOUTH DAILY WITH BREAKFAST. 90 capsule 0    No current facility-administered medications on file prior to visit.     Past Surgical History:  Procedure Laterality Date  . ANKLE SURGERY Left 10/15/2017  . BREAST BIOPSY Left 07/24/2017   Procedure: LEFT BREAST MASS EXCISIONAL BIOPSY;  Surgeon: Rolm Bookbinder, MD;  Location: [place];  Service: General;  Laterality: Left;  . DEBULKING N/A 10/02/2017   Procedure: DEBULKING;  Surgeon: Isabel Caprice, MD;  [facility];  Service: Gynecology;  Laterality: N/A;  . FOOT GANGLION EXCISION Left    age 9  . HYSTERECTOMY ABDOMINAL WITH SALPINGO-OOPHORECTOMY Bilateral 10/02/2017   Procedure: HYSTERECTOMY ABDOMINAL WITH SALPINGO-OOPHORECTOMY;  Surgeon: Isabel Caprice, MD;  [facility];  Service: Gynecology;  Laterality: Bilateral;  bilateral salpingo-oophorectomy with possible total abdominal hysterectomy  . LAPAROTOMY WITH STAGING N/A 10/02/2017   Procedure: LAPAROTOMY WITH STAGING/OMENTECTOMY;  Surgeon: Isabel Caprice, MD;  [facility];  Service: Gynecology;  Laterality: N/A;  . RE-EXCISION OF BREAST LUMPECTOMY Left 08/25/2017   Procedure: LEFT RE-EXCISION OF BREAST MARGINS ERAS PATHWAY;  Surgeon: Rolm Bookbinder, MD;  Location: [place];  Service: General;  Laterality: Left;    Allergies  Allergen Reactions  . Percocet [Oxycodone-Acetaminophen] Nausea And Vomiting    Social History   Socioeconomic History  . Marital status: Single    Spouse name: Not on file  . Number of children: Not on file  .  Years of education: Not on file  . Highest education level: Not on file  Occupational History  . Not on file  Social Needs  . Financial resource strain: Not on file  . Food insecurity    Worry: Not on file    Inability: Not on file  . Transportation needs    Medical: Not on file    Non-medical: Not on file  Tobacco Use  . Smoking status: Current Every Day Smoker    Packs/day: 0.50    Years: 30.00    Pack years: 15.00    Types:  Cigarettes  . Smokeless tobacco: Never Used  . Tobacco comment: 1 daily  Substance and Sexual Activity  . Alcohol use: No    Frequency: Never  . Drug use: No  . Sexual activity: Not Currently    Comment: 1st intercourse 48 yo-More than 5 partners  Lifestyle  . Physical activity    Days per week: Not on file    Minutes per session: Not on file  . Stress: Not on file  Relationships  . Social Herbalist on phone: Not on file    Gets together: Not on file    Attends religious service: Not on file    Active member of club or organization: Not on file    Attends meetings of clubs or organizations: Not on file    Relationship status: Not on file  . Intimate partner violence    Fear of current or ex partner: Not on file    Emotionally abused: Not on file    Physically abused: Not on file    Forced sexual activity: Not on file  Other Topics Concern  . Not on file  Social History Narrative   Marital status: single; girlfriend x 3 years; from [place]; moved [place] in 2015      Children: none      Lives: with girlfriend      Employment: works at [employer]; runs seasonal department      Tobacco: 1.5ppd x age 68.  Never quit.      Alcohol: None; rare      Drugs: week as teenager      Exercise: none      Sexual activity: females since 36; males prior.  One STD/Gonorrhea.    Family History  Problem Relation Age of Onset  . Hyperlipidemia Mother   . Alcohol abuse Mother   . Cirrhosis Mother   . Diabetes Father   . Hyperlipidemia Father   . Hypertension Father   . Stroke Father   . Heart disease Father 10       CAD with stenting multiple  . Hyperlipidemia Brother   . Hypertension Brother   . Diabetes Sister   . Hyperlipidemia Sister   . Hypertension Sister   . Breast cancer Maternal Grandmother        Post menopausal  . Pancreatic cancer Paternal Aunt   . Pancreatic cancer Paternal Aunt     BP 130/80   Ht 5\' 1"  (1.549 m)   Wt 206 lb (93.4 kg)   LMP 09/21/2017  (Exact Date)   BMI 38.92 kg/m   Review of Systems: See HPI above.     Objective:  Physical Exam:  Gen: NAD, comfortable in exam room  Neck: No gross deformity, swelling, bruising. TTP right paraspinal cervical region, trapezius.  No midline/bony TTP. FROM with pain on lateral rotations. BUE strength 5/5. Sensation diminished throughout right hand, forearm. Trace equal  reflexes in triceps, biceps, brachioradialis tendons. Negative spurlings. 2+ radial pulses.   Assessment & Plan:  1. Neck pain with right arm numbness - Independently reviewed radiographs and noted narrowing at C3-4 on right.  Not improving with home exercises, diclofenac, gabapentin.  Will go ahead with MRI of cervical spine to further assess.  Continue titrating gabapentin.  F/u after MRI to go over results.

## 2018-12-24 ENCOUNTER — Ambulatory Visit: Payer: 59 | Admitting: Physical Therapy

## 2018-12-24 ENCOUNTER — Encounter: Payer: Self-pay | Admitting: Physical Therapy

## 2019-01-07 ENCOUNTER — Ambulatory Visit: Payer: 59 | Attending: Family Medicine

## 2019-01-12 ENCOUNTER — Other Ambulatory Visit: Payer: Self-pay

## 2019-01-12 ENCOUNTER — Ambulatory Visit
Admission: RE | Admit: 2019-01-12 | Discharge: 2019-01-12 | Disposition: A | Discharge status: Home / Self Care | Payer: 59 | Source: Ambulatory Visit | Attending: Family Medicine | Admitting: Family Medicine

## 2019-01-12 DIAGNOSIS — M542 Cervicalgia: Secondary | ICD-10-CM

## 2019-01-13 ENCOUNTER — Other Ambulatory Visit: Payer: Self-pay | Admitting: Family Medicine

## 2019-01-13 ENCOUNTER — Other Ambulatory Visit: Payer: Self-pay | Admitting: *Deleted

## 2019-01-13 DIAGNOSIS — M542 Cervicalgia: Secondary | ICD-10-CM

## 2019-01-21 ENCOUNTER — Other Ambulatory Visit: Payer: Self-pay | Admitting: Family Medicine

## 2019-01-26 ENCOUNTER — Ambulatory Visit
Admission: RE | Admit: 2019-01-26 | Discharge: 2019-01-26 | Disposition: A | Discharge status: Home / Self Care | Payer: 59 | Source: Ambulatory Visit | Attending: Family Medicine | Admitting: Family Medicine

## 2019-01-26 DIAGNOSIS — M542 Cervicalgia: Secondary | ICD-10-CM

## 2019-01-26 MED ORDER — TRIAMCINOLONE ACETONIDE 40 MG/ML IJ SUSP (RADIOLOGY): 60.0000 mg | Freq: Once | INTRAMUSCULAR | Status: DC

## 2019-01-26 MED ORDER — IOPAMIDOL (ISOVUE-M 200) INJECTION 41%: 1.0000 mL | Freq: Once | INTRAMUSCULAR | Status: DC

## 2019-01-26 NOTE — Discharge Instructions (Signed)

## 2019-02-17 ENCOUNTER — Ambulatory Visit (INDEPENDENT_AMBULATORY_CARE_PROVIDER_SITE_OTHER): Payer: 59 | Admitting: Family Medicine

## 2019-02-17 ENCOUNTER — Other Ambulatory Visit: Payer: Self-pay

## 2019-02-17 ENCOUNTER — Encounter: Payer: Self-pay | Admitting: Family Medicine

## 2019-02-17 VITALS — BP 138/80 | Ht 63.0 in | Wt 190.0 lb

## 2019-02-17 DIAGNOSIS — M5010 Cervical disc disorder with radiculopathy, unspecified cervical region: Secondary | ICD-10-CM

## 2019-02-17 DIAGNOSIS — M545 Low back pain, unspecified: Secondary | ICD-10-CM

## 2019-02-17 MED ORDER — CYCLOBENZAPRINE HCL 10 MG PO TABS: 10.0000 mg | ORAL_TABLET | Freq: Every evening | ORAL | 0 refills | Status: DC | PRN

## 2019-02-17 NOTE — Patient Instructions (Signed)
Your low back pain is caused by SI joint dysfunction and muscle spasms - We will send you to physical therapy to help improve the motion of the SI joint as well as strengthen your back and core muscles - While in physical therapy they will also work with you on your neck to help further improve your neck pain after the injection you had - You can follow-up on an as-needed basis -If your neck pain worsens please call us and we can refer you for another injection of your neck

## 2019-02-17 NOTE — Progress Notes (Signed)
PCP: Rutherford Guys, MD  Subjective:   HPI: Patient is a 48 y.o. female here for follow-up of neck pain as well as evaluation of lumbar back pain.  She was last seen 1 month ago for her neck pain.  She had an MRI since her last visit showing C4-C5 mild left cord mass-effect due to disc osteophyte complex and as well as cervical narrowing of C5-C6.  Patient had an injection following her MRI which improved her symptoms by 60%.  She notes she is now sleeping better due to the reduction of her pain.  She still has some radicular symptoms down her right arm but these have also improved.  Patient was previously referred to physical therapy but has not yet had a chance to go.  Patient now notes new lumbar back pain that started within the last several months.  She denies any radicular symptoms.  The pain is aching.  She denies any injury or trauma.  She has no numbness or tingling in her legs.  She denies any bowel or bladder incontinence.  She has no fevers or chills.  Review of Systems: See HPI above.  Past Medical History:  Diagnosis Date  . Breast mass, left 07/2017  . Diabetes mellitus without complication ([place])   . Hyperlipidemia   . Migraines     Current Outpatient Medications on File Prior to Visit  Medication Sig Dispense Refill  . atorvastatin (LIPITOR) 40 MG tablet Take 1 tablet (40 mg total) by mouth daily. 90 tablet 3  . dapagliflozin propanediol (FARXIGA) 5 MG TABS tablet Take 5 mg by mouth daily. 30 tablet 3  . diclofenac (VOLTAREN) 75 MG EC tablet TAKE 1 TABLET BY MOUTH TWICE A DAY 60 tablet 1  . estradiol (ESTRACE) 0.5 MG tablet Take 1 tablet (0.5 mg total) by mouth daily. 90 tablet 3  . gabapentin (NEURONTIN) 300 MG capsule Take 1 capsule (300 mg total) by mouth 2 (two) times daily. 90 capsule 3  . losartan (COZAAR) 50 MG tablet Take 1 tablet (50 mg total) by mouth daily. 90 tablet 1  . metFORMIN (GLUCOPHAGE) 1000 MG tablet Take 1 tablet (1,000 mg total) by mouth 2 (two)  times daily with a meal. 180 tablet 1  . venlafaxine XR (EFFEXOR-XR) 75 MG 24 hr capsule TAKE 1 CAPSULE (75 MG TOTAL) BY MOUTH DAILY WITH BREAKFAST. 90 capsule 0   No current facility-administered medications on file prior to visit.     Past Surgical History:  Procedure Laterality Date  . ANKLE SURGERY Left 10/15/2017  . BREAST BIOPSY Left 07/24/2017   Procedure: LEFT BREAST MASS EXCISIONAL BIOPSY;  Surgeon: Rolm Bookbinder, MD;  Location: [facility];  Service: General;  Laterality: Left;  . DEBULKING N/A 10/02/2017   Procedure: DEBULKING;  Surgeon: Isabel Caprice, MD;  [facility];  Service: Gynecology;  Laterality: N/A;  . FOOT GANGLION EXCISION Left    age 59  . HYSTERECTOMY ABDOMINAL WITH SALPINGO-OOPHORECTOMY Bilateral 10/02/2017   Procedure: HYSTERECTOMY ABDOMINAL WITH SALPINGO-OOPHORECTOMY;  Surgeon: Isabel Caprice, MD;  [facility];  Service: Gynecology;  Laterality: Bilateral;  bilateral salpingo-oophorectomy with possible total abdominal hysterectomy  . LAPAROTOMY WITH STAGING N/A 10/02/2017   Procedure: LAPAROTOMY WITH STAGING/OMENTECTOMY;  Surgeon: Isabel Caprice, MD;  [facility];  Service: Gynecology;  Laterality: N/A;  . RE-EXCISION OF BREAST LUMPECTOMY Left 08/25/2017   Procedure: LEFT RE-EXCISION OF BREAST MARGINS ERAS PATHWAY;  Surgeon: Rolm Bookbinder, MD;  Location: [place];  Service: General;  Laterality: Left;    Allergies  Allergen Reactions  . Percocet [Oxycodone-Acetaminophen] Nausea And Vomiting    Social History   Socioeconomic History  . Marital status: Single    Spouse name: Not on file  . Number of children: Not on file  . Years of education: Not on file  . Highest education level: Not on file  Occupational History  . Not on file  Social Needs  . Financial resource strain: Not on file  . Food insecurity    Worry: Not on file    Inability: Not on file  . Transportation needs    Medical: Not on  file    Non-medical: Not on file  Tobacco Use  . Smoking status: Current Every Day Smoker    Packs/day: 0.50    Years: 30.00    Pack years: 15.00    Types: Cigarettes  . Smokeless tobacco: Never Used  . Tobacco comment: 1 daily  Substance and Sexual Activity  . Alcohol use: No    Frequency: Never  . Drug use: No  . Sexual activity: Not Currently    Comment: 1st intercourse 48 yo-More than 5 partners  Lifestyle  . Physical activity    Days per week: Not on file    Minutes per session: Not on file  . Stress: Not on file  Relationships  . Social Herbalist on phone: Not on file    Gets together: Not on file    Attends religious service: Not on file    Active member of club or organization: Not on file    Attends meetings of clubs or organizations: Not on file    Relationship status: Not on file  . Intimate partner violence    Fear of current or ex partner: Not on file    Emotionally abused: Not on file    Physically abused: Not on file    Forced sexual activity: Not on file  Other Topics Concern  . Not on file  Social History Narrative   Marital status: single; girlfriend x 3 years; from [place]; moved [place] in 2015      Children: none      Lives: with girlfriend      Employment: works at [employer]; runs seasonal department      Tobacco: 1.5ppd x age 21.  Never quit.      Alcohol: None; rare      Drugs: week as teenager      Exercise: none      Sexual activity: females since 12; males prior.  One STD/Gonorrhea.    Family History  Problem Relation Age of Onset  . Hyperlipidemia Mother   . Alcohol abuse Mother   . Cirrhosis Mother   . Diabetes Father   . Hyperlipidemia Father   . Hypertension Father   . Stroke Father   . Heart disease Father 71       CAD with stenting multiple  . Hyperlipidemia Brother   . Hypertension Brother   . Diabetes Sister   . Hyperlipidemia Sister   . Hypertension Sister   . Breast cancer Maternal Grandmother        Post  menopausal  . Pancreatic cancer Paternal Aunt   . Pancreatic cancer Paternal Aunt         Objective:  Physical Exam: BP 138/80   Ht 5\' 3"  (1.6 m)   Wt 190 lb (86.2 kg)   LMP 09/21/2017 (Exact Date)   BMI  33.66 kg/m  Gen: NAD, comfortable in exam room Lungs: Breathing comfortably on room air  Cervical Spine Exam -Inspection: No deformity, no discoloration -Palpation: Mild tenderness palpation over spinous processes and paraspinal muscles in the C4-C5 range -ROM: Normal range of motion with flexion, extension, lateral rotation, lateral bending -Spurlings: Negative -Strength 5/5 bilateral upper extremities -Sensation intact to light touch  Lumbar Exam -Inspection: No deformity, no discoloration -Palpation: Tenderness over the left SI joint left paraspinal muscles -ROM: Normal ROM with forward flexion, extension, lateral bending bilaterally, and rotation bilaterally.  Back extension made her pain worse. -Strength: 5/5 hip flexion bilaterally, 5/5 knee extension bilaterally, 5/5 knee flexion bilaterally, 5/5 foot dorsiflexion bilaterally, 5/5 foot plantarflexion bilaterally -Straight leg: Negative -SLUMP: Negative -FABER: Positive on left side  Imaging MRI of the cervical spine  IMPRESSION: 1. C3-4 disc extrusion with mild ventral cord deformity. Right foraminal impingement at this level due to uncovertebral spurring. 2. C4-5 mild left cord mass effect due to disc osteophyte complex. 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 3. C5-6 moderate right foraminal narrowing 4. C6-7 left paracentral protrusion which could affect the intradural C7 nerve root.  Assessment & Plan:  Patient is a 48 y.o. female here for follow-up of cervical radiculopathy evaluation of low back pain  1.  Cervical radiculopathy - Patient with good improvement in symptoms following injection - Patient to start physical therapy to help with further progression of her neck  pain -Physical therapy does not help her pain or she has worsening of her pain will consider referral for repeat injection  2.  Lumbar back pain -Likely secondary to muscle strain as well as SI joint dysfunction -Patient to be referred to physical therapy - Prescription for Flexeril sent to be taken as needed  Patient follow-up on an as-needed basis

## 2019-02-18 ENCOUNTER — Encounter: Payer: Self-pay | Admitting: Family Medicine

## 2019-02-18 MED ORDER — GABAPENTIN 300 MG PO CAPS: 300.0000 mg | ORAL_CAPSULE | Freq: Two times a day (BID) | ORAL | 3 refills | Status: DC

## 2019-02-19 ENCOUNTER — Encounter: Payer: Self-pay | Admitting: Family Medicine

## 2019-03-02 ENCOUNTER — Other Ambulatory Visit: Payer: Self-pay | Admitting: Family Medicine

## 2019-03-04 ENCOUNTER — Ambulatory Visit: Payer: 59 | Attending: Family Medicine | Admitting: Physical Therapy

## 2019-03-04 ENCOUNTER — Other Ambulatory Visit: Payer: Self-pay

## 2019-03-04 ENCOUNTER — Encounter: Payer: Self-pay | Admitting: Physical Therapy

## 2019-03-04 DIAGNOSIS — R262 Difficulty in walking, not elsewhere classified: Secondary | ICD-10-CM

## 2019-03-04 DIAGNOSIS — M545 Low back pain, unspecified: Secondary | ICD-10-CM

## 2019-03-04 DIAGNOSIS — M25572 Pain in left ankle and joints of left foot: Secondary | ICD-10-CM | POA: Insufficient documentation

## 2019-03-04 DIAGNOSIS — R600 Localized edema: Secondary | ICD-10-CM | POA: Diagnosis present

## 2019-03-04 DIAGNOSIS — G8929 Other chronic pain: Secondary | ICD-10-CM | POA: Diagnosis present

## 2019-03-04 DIAGNOSIS — M542 Cervicalgia: Secondary | ICD-10-CM

## 2019-03-04 NOTE — Therapy (Signed)
[place], [place], [address] Phone: [phone]   Fax:  [phone]  Physical Therapy Evaluation  Patient Details  Name: Jody Duke MRN: [id number] Date of Birth: September 22, 1970 Referring Provider (PT): Dene Gentry, MD   Encounter Date: 03/04/2019  PT End of Session - 03/04/19 1311    Visit Number  1    Number of Visits  12    Date for PT Re-Evaluation  04/15/19   will refer back to MD if not improving with PT in 2-3 weeks   Authorization Type  [employer]    PT Start Time  1005    PT Stop Time  1100    PT Time Calculation (min)  55 min    Activity Tolerance  Patient tolerated treatment well;Patient limited by pain    Behavior During Therapy  [facility] for tasks assessed/performed       Past Medical History:  Diagnosis Date  . Breast mass, left 07/2017  . Diabetes mellitus without complication ([place])   . Hyperlipidemia   . Migraines     Past Surgical History:  Procedure Laterality Date  . ANKLE SURGERY Left 10/15/2017  . BREAST BIOPSY Left 07/24/2017   Procedure: LEFT BREAST MASS EXCISIONAL BIOPSY;  Surgeon: Rolm Bookbinder, MD;  Location: [place];  Service: General;  Laterality: Left;  . DEBULKING N/A 10/02/2017   Procedure: DEBULKING;  Surgeon: Isabel Caprice, MD;  Location: [facility];  Service: Gynecology;  Laterality: N/A;  . FOOT GANGLION EXCISION Left    age 79  . HYSTERECTOMY ABDOMINAL WITH SALPINGO-OOPHORECTOMY Bilateral 10/02/2017   Procedure: HYSTERECTOMY ABDOMINAL WITH SALPINGO-OOPHORECTOMY;  Surgeon: Isabel Caprice, MD;  Location[facility];  Service: Gynecology;  Laterality: Bilateral;  bilateral salpingo-oophorectomy with possible total abdominal hysterectomy  . LAPAROTOMY WITH STAGING N/A 10/02/2017   Procedure: LAPAROTOMY WITH STAGING/OMENTECTOMY;  Surgeon: Isabel Caprice, MD;  [facility];  Service: Gynecology;  Laterality: N/A;  . RE-EXCISION OF BREAST LUMPECTOMY Left  08/25/2017   Procedure: LEFT RE-EXCISION OF BREAST MARGINS ERAS PATHWAY;  Surgeon: Rolm Bookbinder, MD;  Location: [place];  Service: General;  Laterality: Left;    There were no vitals filed for this visit.   Subjective Assessment - 03/04/19 1008    Subjective  She has had chronic LBP and neck pain and finally saw MD about this. She relays N/T down her Rt arm. Patient had an injection following her MRI which improved her symptoms by 60%. Her neck is aggravated by any activity, her back is aggravated by prolonged sitting, standing, bending, moving too fast. She also relays Lt ankle pain and had surgery in 2019 (no MD referral to PT for this). She has pain at work and works at [employer] on her feet most of the time bending and picking up boxes.    Pertinent History  Patient had an injection following her MRI which improved her symptoms by 60%    Limitations  Sitting;Lifting;Standing;Walking    How long can you sit comfortably?  10 min    How long can you stand comfortably?  hour    How long can you walk comfortably?  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 "  No recent imaging for her back    Patient Stated Goals  ease the pain  Currently in Pain?  Yes    Pain Score  8     Pain Location  Neck    Pain Orientation  Right    Pain Descriptors / Indicators  Aching    Pain Type  Chronic pain    Pain Radiating Towards  Rt arm down to her fingers and all fingers get numb    Pain Onset  More than a month ago    Pain Frequency  Intermittent    Aggravating Factors   anything    Pain Relieving Factors  nothing    Multiple Pain Sites  Yes    Pain Score  9    Pain Location  Back    Pain Orientation  Lower    Pain Descriptors / Indicators  Aching    Pain Type  Chronic pain    Pain Radiating Towards  down both legs at times    Pain  Onset  More than a month ago    Pain Frequency  Intermittent    Aggravating Factors   sitting, standing, walking for too long, bending, lifting    Pain Relieving Factors  nothing         [facility] PT Assessment - 03/04/19 0001      Assessment   Medical Diagnosis  neck and back pain, SI joint dysfunction    Referring Provider (PT)  Barbaraann Barthel, Sharyn Lull, MD    Onset Date/Surgical Date  --   Chronic pain for over a year   Hand Dominance  Left    Next MD Visit  PRN    Prior Therapy  For her left ankle after surgery      Precautions   Precautions  None      Restrictions   Weight Bearing Restrictions  No      Balance Screen   Has the patient fallen in the past 6 months  No      [place] residence      Prior Function   Level of Independence  Independent      Cognition   Overall Cognitive Status  Within Functional Limits for tasks assessed      Observation/Other Assessments   Focus on Therapeutic Outcomes (FOTO)   not done, multiple body parts      Sensation   Light Touch  Appears Intact      Coordination   Gross Motor Movements are Fluid and Coordinated  Yes      Posture/Postural Control   Posture Comments  slumped posture with rounded shoulders and Thoracic kyphosis      ROM / Strength   AROM / PROM / Strength  AROM;Strength      AROM   Overall AROM Comments  pain with all movements    AROM Assessment Site  Cervical;Lumbar    Cervical Flexion  WNL    Cervical Extension  50%    Cervical - Right Side Bend  50%    Cervical - Left Side Bend  50%    Cervical - Right Rotation  75%    Cervical - Left Rotation  75%    Lumbar Flexion  75%    Lumbar Extension  50%    Lumbar - Right Side Bend  50%    Lumbar - Left Side Bend  50%    Lumbar - Right Rotation  75%    Lumbar - Left Rotation  75%      Strength   Overall Strength Comments  UE  Rt arm overall 4 to 4+/5, Lt arm 5/5, Lt grip 5/5, Rt grip 4/5, bilat leg strength overall grossly  4+/5 MMT all tested in sititng except Lt ankle 4/5 MMT overall      Flexibility   Soft Tissue Assessment /Muscle Length  --   very tight lumbar-thoracic paraspinals, hamstrings, glutes     Palpation   Palpation comment  very TTP to light touch and pressure on her spine in cervical-thoracolumbar and glutes bilat. Unable to assess spinal mobility due to pain      Special Tests   Other special tests  + slump test Rt worse than Lt, + SLR on Rt neg on Lt, + FABERS bilat, + SI joint tests on Rt. + quadrant tests bilat      Transfers   Transfers  Independent with all Transfers    Comments  increased time needed due to pain      Ambulation/Gait   Gait Comments  WFL gait                Objective measurements completed on examination: See above findings.      [facility] Adult PT Treatment/Exercise - 03/04/19 0001      Modalities   Modalities  Electrical Stimulation;Moist Heat      Moist Heat Therapy   Number Minutes Moist Heat  10 Minutes    Moist Heat Location  Cervical;Lumbar Spine      Electrical Stimulation   Electrical Stimulation Location  neck and back 10 min pt prone    Electrical Stimulation Action  IFC     Electrical Stimulation Parameters  to tolerance    Electrical Stimulation Goals  Pain             PT Education - 03/04/19 1310    Education Details  HEP, POC, TENS, pain science    Person(s) Educated  Patient    Methods  Explanation;Demonstration;Verbal cues;Handout    Comprehension  Verbalized understanding;Need further instruction          PT Long Term Goals - 03/04/19 1325      PT LONG TERM GOAL #1   Title  Pt will be I and compliant with HEP. (Target goal for all goals 6 weeks 04/15/19)    Status  New      PT LONG TERM GOAL #2   Title  Pt will improve lumbar and neck ROM to [facility] to improve mobility    Status  New      PT LONG TERM GOAL #3   Title  Pt will relay less often and more managed radicular symptoms.    Status  New      PT LONG  TERM GOAL #4   Title  Pt will improve UE/LE strength to at least 5-/5 MMT to improve function    Status  New      PT LONG TERM GOAL #5   Title  Pt will reduce pain by overall 50%    Status  New             Plan - 03/04/19 1317    Clinical Impression Statement  Pt presents with signs and symptoms consistent of Chronic neck pain with radiculopathy and chronic low back pain with radiculopathy. 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 "  For her back pain is nonspecific and all special testing positive for disc pathology, SI joint, facet arthorpathy and neural tension.  She is very Tender to palpation and has widespread pain.  She has overall decreased neck and lumbar ROM, decreased strength, decreased activity tolerance particularly with standing, lifting or walking, and increased pain limiting her functional abilities and work abilities. She will benefit from skilled PT to address her deficits.    Personal Factors and Comorbidities  Comorbidity 1;Comorbidity 2;Past/Current Experience    Comorbidities  [id number] disc extrusion, DM, migranes, 2 breast surgeries, Lt ankle surgery    Examination-Activity Limitations  Bend;Carry;Sleep;Squat;Stairs;Lift    Examination-Participation Restrictions  Driving;Community Activity;Meal Prep;Laundry;Shop    Stability/Clinical Decision Making  Evolving/Moderate complexity    Clinical Decision Making  Moderate    Rehab Potential  Fair    PT Frequency  2x / week    PT Duration  6 weeks    PT Treatment/Interventions  Aquatic Therapy;Cryotherapy;Electrical Stimulation;Iontophoresis 4mg /ml Dexamethasone;Moist Heat;Traction;Ultrasound;Therapeutic activities;Therapeutic exercise;Neuromuscular re-education;Manual techniques;Passive range of motion;Dry needling;Spinal Manipulations;Joint Manipulations    PT Next Visit Plan  how was TENS and HEP, needs pain science  education, and gentle exercise to start    PT Home Exercise Plan  Access Code: [email or web address]       Patient will benefit from skilled therapeutic intervention in order to improve the following deficits and impairments:  Decreased endurance, Decreased range of motion, Decreased strength, Difficulty walking, Impaired flexibility, Increased muscle spasms, Increased fascial restricitons, Pain  Visit Diagnosis: Cervicalgia  Chronic bilateral low back pain without sciatica  Difficulty in walking, not elsewhere classified     Problem List Patient Active Problem List   Diagnosis Date Noted  . Hot flashes due to surgical menopause 12/28/2017  . Pure hypercholesterolemia 07/30/2017  . Type 2 diabetes mellitus without complication, without long-term current use of insulin ([place]) 07/30/2017  . Hematuria, microscopic 07/30/2017  . Menometrorrhagia 07/30/2017  . Ankle mass, left 07/30/2017  . Blood pressure elevated without history of HTN 07/30/2017  . Tobacco abuse 07/30/2017  . Breast mass, right 07/30/2017  . Class 1 obesity due to excess calories with serious comorbidity and body mass index (BMI) of 33.0 to 33.9 in adult 07/30/2017    Silvestre Mesi 03/04/2019, 1:28 PM  [facility] [address] [place], [place], [address] Phone: [phone]   Fax:  [phone]  Name: Jody Duke MRN: [id number] Date of Birth: 1971/05/05

## 2019-03-04 NOTE — Patient Instructions (Addendum)
TENS UNIT: This is helpful for muscle pain and spasm.   Search and Purchase a TENS 7000 2nd edition at www.tenspros.com. It should be less than $30.     TENS unit instructions: Do not shower or bathe with the unit on Turn the unit off before removing electrodes or batteries If the electrodes lose stickiness add a drop of water to the electrodes after they are disconnected from the unit and place on plastic sheet. If you continued to have difficulty, call the TENS unit company to purchase more electrodes. Do not apply lotion on the skin area prior to use. Make sure the skin is clean and dry as this will help prolong the life of the electrodes. After use, always check skin for unusual red areas, rash or other skin difficulties. If there are any skin problems, does not apply electrodes to the same area. Never remove the electrodes from the unit by pulling the wires. Do not use the TENS unit or electrodes other than as directed. Do not change electrode placement without consultating your therapist or physician. Keep 2 fingers with between each electrode. Wear time ratio is 2:1, on to off times.    For example on for 30 minutes off for 15 minutes and then on for 30 minutes off for 15 minutes  Access Code: [email or web address]  URL: [email or web address]  Date: 03/04/2019  Prepared by: Elsie Ra   Exercises  Seated Cervical Sidebending Stretch - 3 sets - 30 hold - 2x daily - 6x weekly  Seated Cervical Retraction - 10 reps - 3 sets - 2x daily - 6x weekly  Sidelying Upper Thoracic Rotation - 10 reps - 3 sets - 2x daily - 6x weekly  Cervical Extension AROM with Strap - 10 reps - 1-2 sets - 5 hold - 2x daily - 6x weekly  Supine Piriformis Stretch - 3 reps - 1 sets - 30 hold - 2x daily - 6x weekly  Supine Bridge - 10 reps - 1-2 sets - 5 hold - 2x daily - 6x weekly  Slump Stretch - 10 reps - 1-2 sets - 3 hold - 2x daily - 6x weekly

## 2019-03-11 ENCOUNTER — Ambulatory Visit: Payer: 59 | Admitting: Physical Therapy

## 2019-03-11 ENCOUNTER — Other Ambulatory Visit: Payer: Self-pay

## 2019-03-11 DIAGNOSIS — M542 Cervicalgia: Secondary | ICD-10-CM

## 2019-03-11 DIAGNOSIS — G8929 Other chronic pain: Secondary | ICD-10-CM

## 2019-03-11 DIAGNOSIS — R262 Difficulty in walking, not elsewhere classified: Secondary | ICD-10-CM

## 2019-03-11 DIAGNOSIS — M545 Low back pain, unspecified: Secondary | ICD-10-CM

## 2019-03-13 NOTE — Therapy (Signed)
[place], [place], [address] Phone: [phone]   Fax:  [phone]  Physical Therapy Treatment  Patient Details  Name: Jody Duke MRN: [id number] Date of Birth: [birth year] Referring Provider (PT): Dene Gentry, MD   Encounter Date: 03/11/2019  PT End of Session - 03/13/19 0929    Visit Number  2    Number of Visits  12    Date for PT Re-Evaluation  04/15/19   will refer back to MD if not improving with PT in 2-3 weeks   Authorization Type  [employer]    PT Start Time  1615    PT Stop Time  1700    PT Time Calculation (min)  45 min    Activity Tolerance  Patient tolerated treatment well;Patient limited by pain    Behavior During Therapy  [facility] for tasks assessed/performed       Past Medical History:  Diagnosis Date  . Breast mass, left 07/2017  . Diabetes mellitus without complication ([place])   . Hyperlipidemia   . Migraines     Past Surgical History:  Procedure Laterality Date  . ANKLE SURGERY Left 10/15/2017  . BREAST BIOPSY Left 07/24/2017   Procedure: LEFT BREAST MASS EXCISIONAL BIOPSY;  Surgeon: Rolm Bookbinder, MD;  Location: [place];  Service: General;  Laterality: Left;  . DEBULKING N/A 10/02/2017   Procedure: DEBULKING;  Surgeon: Isabel Caprice, MD;  [facility];  Service: Gynecology;  Laterality: N/A;  . FOOT GANGLION EXCISION Left    age 48  . HYSTERECTOMY ABDOMINAL WITH SALPINGO-OOPHORECTOMY Bilateral 10/02/2017   Procedure: HYSTERECTOMY ABDOMINAL WITH SALPINGO-OOPHORECTOMY;  Surgeon: Isabel Caprice, MD;  [facility];  Service: Gynecology;  Laterality: Bilateral;  bilateral salpingo-oophorectomy with possible total abdominal hysterectomy  . LAPAROTOMY WITH STAGING N/A 10/02/2017   Procedure: LAPAROTOMY WITH STAGING/OMENTECTOMY;  Surgeon: Isabel Caprice, MD;  [facility];  Service: Gynecology;  Laterality: N/A;  . RE-EXCISION OF BREAST LUMPECTOMY Left  08/25/2017   Procedure: LEFT RE-EXCISION OF BREAST MARGINS ERAS PATHWAY;  Surgeon: Rolm Bookbinder, MD;  Location: [place];  Service: General;  Laterality: Left;    There were no vitals filed for this visit.  Subjective Assessment - 03/13/19 0924    Subjective  She has been working everyday and has not had a chance to try her HEP, she says she has new MD prescription to add in her Lt ankle but unable to provide a copy of this presciption and will email it to PT. Her neck is a 9/10 pain and now pain is on the Lt side.    Pertinent History  Patient had an injection following her MRI which improved her symptoms by 60%    Limitations  Sitting;Lifting;Standing;Walking    How long can you sit comfortably?  10 min    How long can you stand comfortably?  hour    How long can you walk comfortably?  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 "  No recent imaging for her back    Patient Stated Goals  ease the pain    Pain Onset  More than a month ago    Pain Onset  More than a month ago  [facility] Adult PT Treatment/Exercise - 03/13/19 0001      Exercises   Exercises  Neck      Modalities   Modalities  Electrical Stimulation;Moist Heat      Moist Heat Therapy   Number Minutes Moist Heat  15 Minutes    Moist Heat Location  Cervical;Lumbar Spine      Electrical Stimulation   Electrical Stimulation Location  neck and back    Electrical Stimulation Action  pre mod    Electrical Stimulation Parameters  tolerance    Electrical Stimulation Goals  Pain      Manual Therapy   Manual therapy comments  cervical distraction, neck PROM, gentle central and unilat on Lt CT spine mobs only able to tolerate grade I      Neck Exercises: Stretches   Upper Trapezius Stretch  Right;Left;2 reps;30 seconds     Levator Stretch  Right;Left;2 reps;30 seconds    Other Neck Stretches  chin tucks X 10, cerv ext with towel and cerv rotation with towel SNAG/MWM X 10 ea                  PT Long Term Goals - 03/04/19 1325      PT LONG TERM GOAL #1   Title  Pt will be I and compliant with HEP. (Target goal for all goals 6 weeks 04/15/19)    Status  New      PT LONG TERM GOAL #2   Title  Pt will improve lumbar and neck ROM to [facility] to improve mobility    Status  New      PT LONG TERM GOAL #3   Title  Pt will relay less often and more managed radicular symptoms.    Status  New      PT LONG TERM GOAL #4   Title  Pt will improve UE/LE strength to at least 5-/5 MMT to improve function    Status  New      PT LONG TERM GOAL #5   Title  Pt will reduce pain by overall 50%    Status  New            Plan - 03/13/19 0929    Clinical Impression Statement  Session limited by pain mostly on the Lt side of her neck, had poor tolerance to stretching and exercises and manual therapy due to high irritability of symptoms. Used heat and TENS in efforts to calm down pain. 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, DM, migranes, 2 breast surgeries, Lt ankle surgery    Examination-Activity Limitations  Bend;Carry;Sleep;Squat;Stairs;Lift    Examination-Participation Restrictions  Driving;Community Activity;Meal Prep;Laundry;Shop    Stability/Clinical Decision Making  Evolving/Moderate complexity    Rehab Potential  Fair    PT Frequency  2x / week    PT Duration  6 weeks    PT Treatment/Interventions  Aquatic Therapy;Cryotherapy;Electrical Stimulation;Iontophoresis 4mg /ml Dexamethasone;Moist Heat;Traction;Ultrasound;Therapeutic activities;Therapeutic exercise;Neuromuscular re-education;Manual techniques;Passive range of motion;Dry needling;Spinal  Manipulations;Joint Manipulations    PT Next Visit Plan  how was TENS and HEP, needs pain science education, and gentle exercise to start    PT Home Exercise Plan  Access Code: [email or web address]       Patient will benefit from skilled therapeutic intervention in order to improve the following deficits and impairments:  Decreased endurance, Decreased range of motion, Decreased strength, Difficulty walking, Impaired  flexibility, Increased muscle spasms, Increased fascial restricitons, Pain  Visit Diagnosis: Cervicalgia  Chronic bilateral low back pain without sciatica  Difficulty in walking, not elsewhere classified     Problem List Patient Active Problem List   Diagnosis Date Noted  . Hot flashes due to surgical menopause 12/28/2017  . Pure hypercholesterolemia 07/30/2017  . Type 2 diabetes mellitus without complication, without long-term current use of insulin ([place]) 07/30/2017  . Hematuria, microscopic 07/30/2017  . Menometrorrhagia 07/30/2017  . Ankle mass, left 07/30/2017  . Blood pressure elevated without history of HTN 07/30/2017  . Tobacco abuse 07/30/2017  . Breast mass, right 07/30/2017  . Class 1 obesity due to excess calories with serious comorbidity and body mass index (BMI) of 33.0 to 33.9 in adult 07/30/2017    Jody Duke 03/13/2019, 9:32 AM  [facility] [address] [place], [place], [address] Phone: [phone]   Fax:  [phone]  Name: Jody Duke MRN: [id number] Date of Birth: 04/11/1971

## 2019-03-16 ENCOUNTER — Other Ambulatory Visit: Payer: Self-pay

## 2019-03-16 ENCOUNTER — Ambulatory Visit: Payer: 59 | Admitting: Physical Therapy

## 2019-03-16 DIAGNOSIS — M25572 Pain in left ankle and joints of left foot: Secondary | ICD-10-CM

## 2019-03-16 DIAGNOSIS — R600 Localized edema: Secondary | ICD-10-CM

## 2019-03-16 DIAGNOSIS — M542 Cervicalgia: Secondary | ICD-10-CM

## 2019-03-16 DIAGNOSIS — M545 Low back pain, unspecified: Secondary | ICD-10-CM

## 2019-03-16 DIAGNOSIS — G8929 Other chronic pain: Secondary | ICD-10-CM

## 2019-03-16 DIAGNOSIS — R262 Difficulty in walking, not elsewhere classified: Secondary | ICD-10-CM

## 2019-03-16 NOTE — Patient Instructions (Addendum)
Access Code: [email or web address]  URL: [email or web address]  Date: 03/16/2019  Prepared by: Elsie Ra   Exercises  Seated Ankle Inversion with Resistance - 10 reps - 3 sets - 2x daily - 6x weekly  Seated Ankle Dorsiflexion with Anchored Resistance - 10 reps - 3 sets - 2x daily - 6x weekly  Seated Ankle Eversion with Resistance - 10 reps - 3 sets - 2x daily - 6x weekly  Seated Eccentric Ankle Plantar Flexion with Resistance - Straight Leg - 10 reps - 3 sets - 2x daily - 6x weekly  Tandem Stance - 30 sec X 2 reps - 2x daily - 6x weekly  Long Sitting Calf Stretch with Strap - 3 sets - 30 hold - 2x daily - 6x weekly  Seated foot extensor stretch pulling toes down 30 sec X 2-3 reps 2X daily.

## 2019-03-16 NOTE — Therapy (Signed)
[place], [place], [address] Phone: [phone]   Fax:  [phone]  Physical Therapy Treatment/RE-evaluation  Patient Details  Name: Jody Duke MRN: [id number] Date of Birth: 25-May-1971 Referring Provider (PT): Dene Gentry, MD   Encounter Date: 03/16/2019  PT End of Session - 03/16/19 1056    Visit Number  3    Number of Visits  12    Date for PT Re-Evaluation  04/15/19    Authorization Type  [employer]    PT Start Time  1000    PT Stop Time  1046    PT Time Calculation (min)  46 min    Activity Tolerance  Patient tolerated treatment well    Behavior During Therapy  [facility] for tasks assessed/performed       Past Medical History:  Diagnosis Date  . Breast mass, left 07/2017  . Diabetes mellitus without complication ([place])   . Hyperlipidemia   . Migraines     Past Surgical History:  Procedure Laterality Date  . ANKLE SURGERY Left 10/15/2017  . BREAST BIOPSY Left 07/24/2017   Procedure: LEFT BREAST MASS EXCISIONAL BIOPSY;  Surgeon: Rolm Bookbinder, MD;  Location: [place];  Service: General;  Laterality: Left;  . DEBULKING N/A 10/02/2017   Procedure: DEBULKING;  Surgeon: Isabel Caprice, MD;  [facility];  Service: Gynecology;  Laterality: N/A;  . FOOT GANGLION EXCISION Left    age 48  . HYSTERECTOMY ABDOMINAL WITH SALPINGO-OOPHORECTOMY Bilateral 10/02/2017   Procedure: HYSTERECTOMY ABDOMINAL WITH SALPINGO-OOPHORECTOMY;  Surgeon: Isabel Caprice, MD;  [facility];  Service: Gynecology;  Laterality: Bilateral;  bilateral salpingo-oophorectomy with possible total abdominal hysterectomy  . LAPAROTOMY WITH STAGING N/A 10/02/2017   Procedure: LAPAROTOMY WITH STAGING/OMENTECTOMY;  Surgeon: Isabel Caprice, MD;  [facility];  Service: Gynecology;  Laterality: N/A;  . RE-EXCISION OF BREAST LUMPECTOMY Left 08/25/2017   Procedure: LEFT RE-EXCISION OF BREAST MARGINS ERAS PATHWAY;   Surgeon: Rolm Bookbinder, MD;  Location: [place];  Service: General;  Laterality: Left;    There were no vitals filed for this visit.  Subjective Assessment - 03/16/19 1010    Subjective  Says MD wants her to bee seen for her Lt foot, she had surgery April 2019 at [facility] for EXCISION, TUMOR, SOFT TISSUE OF FOOT. She  still gets some numbness and burning in her foot with weakness and pain is 6-9 and worse at night. Her foot is aggravated by any standing activity. Pain is centered around the lateral ankle and top of her toes, sometimes in the heel. Of note her neck and back do not bother her much today however she has not yet been to work.    Pertinent History  Patient had an injection following her MRI which improved her symptoms by 60%    Limitations  Sitting;Lifting;Standing;Walking    How long can you sit comfortably?  10 min    How long can you stand comfortably?  hour    How long can you walk comfortably?  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 "  No recent imaging for her back    Patient Stated Goals  ease the pain    Pain Onset  More than a month ago    Pain Onset  More than a month  ago         [facility] PT Assessment - 03/16/19 0001      Assessment   Medical Diagnosis  neck and back pain, SI joint dysfunction, added Lt ankle into POC 03/16/19    Referring Provider (PT)  Dene Gentry, MD      AROM   AROM Assessment Site  Ankle    Right/Left Ankle  Left    Left Ankle Dorsiflexion  3    Left Ankle Plantar Flexion  20    Left Ankle Inversion  15    Left Ankle Eversion  5      Strength   Strength Assessment Site  Ankle    Right/Left Ankle  Left    Left Ankle Dorsiflexion  4/5    Left Ankle Plantar Flexion  4/5    Left Ankle Inversion  4/5    Left Ankle Eversion  4/5      Palpation   Palpation comment  TTP in  peroneals, foot extensors                   [facility] Adult PT Treatment/Exercise - 03/16/19 0001      Exercises   Exercises  Ankle      Modalities   Modalities  Electrical Stimulation;Vasopneumatic      Electrical Stimulation   Electrical Stimulation Location  Lt ankle    Electrical Stimulation Action  IFC    Electrical Stimulation Parameters  tolerance    Electrical Stimulation Goals  Pain      Vasopneumatic   Number Minutes Vasopneumatic   10 minutes    Vasopnuematic Location   Ankle    Vasopneumatic Pressure  Low    Vasopneumatic Temperature   34      Manual Therapy   Manual therapy comments  Lt ankle PROM all planes, DF mobs and distraction mobs, manual heel cord stretching and foot extensor stretching      Ankle Exercises: Stretches   Gastroc Stretch  2 reps;30 seconds    Gastroc Stretch Limitations  supine with strap 2 reps gastroc, 2 reps soleus      Ankle Exercises: Supine   T-Band  4 way with red X 10 ea,    Other Supine Ankle Exercises  alphabet X 1 capital letters             PT Education - 03/16/19 1043    Education Details  updated HEP and POC to add in her Lt ankle    Person(s) Educated  Patient    Methods  Explanation;Demonstration;Verbal cues;Handout    Comprehension  Verbalized understanding          PT Long Term Goals - 03/16/19 1105      PT LONG TERM GOAL #1   Title  Pt will be I and compliant with HEP. (Target goal for all goals 6 weeks 04/15/19)    Baseline  just added HEP for Lt ankle today    Status  On-going      PT LONG TERM GOAL #2   Title  Pt will improve lumbar and neck ROM and Lt ankle ROM to [facility] to improve mobility    Baseline  revised to add in Lt ankle    Status  Revised      PT LONG TERM GOAL #3   Title  Pt will relay less often and more managed radicular symptoms.    Baseline  does relay this is improving some    Status  On-going  PT LONG TERM GOAL #4   Title  Pt will improve UE/LE strength to at  least 5-/5 MMT to improve function    Status  On-going      PT LONG TERM GOAL #5   Title  Pt will reduce pain by overall 50%    Status  On-going            Plan - 03/16/19 1057    Clinical Impression Statement  Re eval performed today as she has new MD prescription for her Lt ankle S/P surgery April 2019 at [facility] for EXCISION, TUMOR, SOFT TISSUE OF FOOT. She has pain and swelling in her Lt ankle and signs of peroneal and foot extensor tendonitis. She has decreased ankle ROM, decreased ankle strength, decreased proprioception, and decreased activity tolerance for standing and walking. She will benefit from skilled PT to adress these deficits along with continued PT for her chronic neck and back pain. She was trialed on vaspopnuematic today for Lt foot pain and swelling and given updated HEP for activites for Lt foot/ankle    Personal Factors and Comorbidities  Comorbidity 1;Comorbidity 2;Past/Current Experience    Comorbidities  [id number] disc extrusion, DM, migranes, 2 breast surgeries, Lt ankle surgery    Examination-Participation Restrictions  Driving;Community Activity;Meal Prep;Laundry;Shop    Stability/Clinical Decision Making  Evolving/Moderate complexity    Rehab Potential  Fair    PT Frequency  2x / week    PT Duration  6 weeks    PT Treatment/Interventions  Aquatic Therapy;Cryotherapy;Electrical Stimulation;Iontophoresis 4mg /ml Dexamethasone;Moist Heat;Traction;Ultrasound;Therapeutic activities;Therapeutic exercise;Neuromuscular re-education;Manual techniques;Passive range of motion;Dry needling;Spinal Manipulations;Joint Manipulations    PT Next Visit Plan  how was TENS and vaso to her Lt ankle,  and HEP, needs pain science education, and gentle exercise to start    PT Home Exercise Plan  Access Code: [email or web address] for neck and back, then added Lt ankle 4way with tband, gastroc stretch, foot extensor stretch, tandem balance       Patient will benefit from skilled therapeutic  intervention in order to improve the following deficits and impairments:  Decreased endurance, Decreased range of motion, Decreased strength, Difficulty walking, Impaired flexibility, Increased muscle spasms, Increased fascial restricitons, Pain  Visit Diagnosis: Cervicalgia  Chronic bilateral low back pain without sciatica  Difficulty in walking, not elsewhere classified  Pain in left ankle and joints of left foot  Localized edema     Problem List Patient Active Problem List   Diagnosis Date Noted  . Hot flashes due to surgical menopause 12/28/2017  . Pure hypercholesterolemia 07/30/2017  . Type 2 diabetes mellitus without complication, without long-term current use of insulin ([place]) 07/30/2017  . Hematuria, microscopic 07/30/2017  . Menometrorrhagia 07/30/2017  . Ankle mass, left 07/30/2017  . Blood pressure elevated without history of HTN 07/30/2017  . Tobacco abuse 07/30/2017  . Breast mass, right 07/30/2017  . Class 1 obesity due to excess calories with serious comorbidity and body mass index (BMI) of 33.0 to 33.9 in adult 07/30/2017    Silvestre Mesi 03/16/2019, [place], [place], [address] Phone: [phone]   Fax:  [phone]  Name: KHAMORA ZASKE MRN: [id number] Date of Birth: 1970-09-20

## 2019-03-18 ENCOUNTER — Ambulatory Visit: Payer: 59 | Admitting: Physical Therapy

## 2019-03-18 ENCOUNTER — Ambulatory Visit (INDEPENDENT_AMBULATORY_CARE_PROVIDER_SITE_OTHER): Payer: 59 | Admitting: Family Medicine

## 2019-03-18 ENCOUNTER — Other Ambulatory Visit: Payer: Self-pay

## 2019-03-18 ENCOUNTER — Encounter: Payer: Self-pay | Admitting: Family Medicine

## 2019-03-18 VITALS — BP 132/83 | HR 93 | Temp 98.8°F | Ht 63.0 in | Wt 208.0 lb

## 2019-03-18 DIAGNOSIS — R809 Proteinuria, unspecified: Secondary | ICD-10-CM

## 2019-03-18 DIAGNOSIS — E1129 Type 2 diabetes mellitus with other diabetic kidney complication: Secondary | ICD-10-CM | POA: Diagnosis not present

## 2019-03-18 DIAGNOSIS — E7800 Pure hypercholesterolemia, unspecified: Secondary | ICD-10-CM

## 2019-03-18 DIAGNOSIS — F418 Other specified anxiety disorders: Secondary | ICD-10-CM

## 2019-03-18 LAB — POCT GLYCOSYLATED HEMOGLOBIN (HGB A1C): Hemoglobin A1C: 7 % — AB (ref 4.0–5.6)

## 2019-03-18 MED ORDER — GABAPENTIN 300 MG PO CAPS: 300.0000 mg | ORAL_CAPSULE | Freq: Three times a day (TID) | ORAL | 3 refills | Status: DC

## 2019-03-18 MED ORDER — VENLAFAXINE HCL ER 150 MG PO CP24: 150.0000 mg | ORAL_CAPSULE | Freq: Every day | ORAL | 1 refills | Status: DC

## 2019-03-18 NOTE — Patient Instructions (Addendum)
[facility] [phone]    Carbohydrate Counting for Diabetes Mellitus, Adult  Carbohydrate counting is a method of keeping track of how many carbohydrates you eat. Eating carbohydrates naturally increases the amount of sugar (glucose) in the blood. Counting how many carbohydrates you eat helps keep your blood glucose within normal limits, which helps you manage your diabetes (diabetes mellitus). It is important to know how many carbohydrates you can safely have in each meal. This is different for every person. A diet and nutrition specialist (registered dietitian) can help you make a meal plan and calculate how many carbohydrates you should have at each meal and snack. Carbohydrates are found in the following foods:  Grains, such as breads and cereals.  Dried beans and soy products.  Starchy vegetables, such as potatoes, peas, and corn.  Fruit and fruit juices.  Milk and yogurt.  Sweets and snack foods, such as cake, cookies, candy, chips, and soft drinks. How do I count carbohydrates? There are two ways to count carbohydrates in food. You can use either of the methods or a combination of both. Reading "Nutrition Facts" on packaged food The "Nutrition Facts" list is included on the labels of almost all packaged foods and beverages in the [place] It includes:  The serving size.  Information about nutrients in each serving, including the grams (g) of carbohydrate per serving. To use the "Nutrition Facts":  Decide how many servings you will have.  Multiply the number of servings by the number of carbohydrates per serving.  The resulting number is the total amount of carbohydrates that you will be having. Learning standard serving sizes of other foods When you eat carbohydrate foods that are not packaged or do not include "Nutrition Facts" on the label, you need to measure the servings in order to count the amount of carbohydrates:  Measure the foods that you will eat  with a food scale or measuring cup, if needed.  Decide how many standard-size servings you will eat.  Multiply the number of servings by 15. Most carbohydrate-rich foods have about 15 g of carbohydrates per serving. ? For example, if you eat 8 oz (170 g) of strawberries, you will have eaten 2 servings and 30 g of carbohydrates (2 servings x 15 g = 30 g).  For foods that have more than one food mixed, such as soups and casseroles, you must count the carbohydrates in each food that is included. The following list contains standard serving sizes of common carbohydrate-rich foods. Each of these servings has about 15 g of carbohydrates:   hamburger bun or  English muffin.   oz (15 mL) syrup.   oz (14 g) jelly.  1 slice of bread.  1 six-inch tortilla.  3 oz (85 g) cooked rice or pasta.  4 oz (113 g) cooked dried beans.  4 oz (113 g) starchy vegetable, such as peas, corn, or potatoes.  4 oz (113 g) hot cereal.  4 oz (113 g) mashed potatoes or  of a large baked potato.  4 oz (113 g) canned or frozen fruit.  4 oz (120 mL) fruit juice.  4-6 crackers.  6 chicken nuggets.  6 oz (170 g) unsweetened dry cereal.  6 oz (170 g) plain fat-free yogurt or yogurt sweetened with artificial sweeteners.  8 oz (240 mL) milk.  8 oz (170 g) fresh fruit or one small piece of fruit.  24 oz (680 g) popped popcorn. Example of carbohydrate counting Sample meal  3 oz (  85 g) chicken breast.  6 oz (170 g) brown rice.  4 oz (113 g) corn.  8 oz (240 mL) milk.  8 oz (170 g) strawberries with sugar-free whipped topping. Carbohydrate calculation 1. Identify the foods that contain carbohydrates: ? Rice. ? Corn. ? Milk. ? Strawberries. 2. Calculate how many servings you have of each food: ? 2 servings rice. ? 1 serving corn. ? 1 serving milk. ? 1 serving strawberries. 3. Multiply each number of servings by 15 g: ? 2 servings rice x 15 g = 30 g. ? 1 serving corn x 15 g = 15 g. ? 1  serving milk x 15 g = 15 g. ? 1 serving strawberries x 15 g = 15 g. 4. Add together all of the amounts to find the total grams of carbohydrates eaten: ? 30 g + 15 g + 15 g + 15 g = 75 g of carbohydrates total. Summary  Carbohydrate counting is a method of keeping track of how many carbohydrates you eat.  Eating carbohydrates naturally increases the amount of sugar (glucose) in the blood.  Counting how many carbohydrates you eat helps keep your blood glucose within normal limits, which helps you manage your diabetes.  A diet and nutrition specialist (registered dietitian) can help you make a meal plan and calculate how many carbohydrates you should have at each meal and snack. This information is not intended to replace advice given to you by your health care provider. Make sure you discuss any questions you have with your health care provider. Document Released: 06/17/2005 Document Revised: 01/09/2017 Document Reviewed: 11/29/2015 Elsevier Patient Education  [employer].   If you have lab work done today you will be contacted with your lab results within the next 2 weeks.  If you have not heard from [place] then please contact us. The fastest way to get your results is to register for My Chart.   IF you received an x-ray today, you will receive an invoice from [facility]. Please contact [facility] Radiology at [phone] with questions or concerns regarding your invoice.   IF you received labwork today, you will receive an invoice from [place]. Please contact [facility] at [phone] with questions or concerns regarding your invoice.   Our billing staff will not be able to assist you with questions regarding bills from these companies.  You will be contacted with the lab results as soon as they are available. The fastest way to get your results is to activate your My Chart account. Instructions are located on the last page of this paperwork. If you have not heard from [place]  regarding the results in 2 weeks, please contact this office.

## 2019-03-18 NOTE — Progress Notes (Signed)
9/17/20209:01 AM  Jody Duke [birth year], 48 y.o., female [id number]  Chief Complaint  Patient presents with  . Follow-up    chronic medical conditions    HPI:   Patient is a 48 y.o. 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, HLP, post-menopausal hot flashes who presents today for routine followup  Last OV June 2020 Added farxiga, increased atorvastatin, decreased gabapentin Seeing sports med for neck pain She is not liking weaning off the effexor, has noticed worsening moods, would like to get back on previous dose She has noticed worsening pain with lower gabapentin dose Tolerating faxiga well, fasting 145-160 postparandial ~ 135 Mood is causing her to stress eats   Depression screen [facility] 2/9 10/26/2018 10/08/2018 07/09/2018  Decreased Interest 0 0 0  Down, Depressed, Hopeless 0 0 0  PHQ - 2 Score 0 0 0    Fall Risk  10/26/2018 10/08/2018 07/09/2018 04/02/2018 12/31/2017  Falls in the past year? 0 0 0 No No  Number falls in past yr: 0 0 - - -  Injury with Fall? 0 0 - - -  Follow up - Falls evaluation completed - - -     Allergies  Allergen Reactions  . Percocet [Oxycodone-Acetaminophen] Nausea And Vomiting    Prior to Admission medications   Medication Sig Start Date End Date Taking? Authorizing Provider  atorvastatin (LIPITOR) 40 MG tablet Take 1 tablet (40 mg total) by mouth daily. 12/18/18   Rutherford Guys, MD  cyclobenzaprine (FLEXERIL) 10 MG tablet Take 1 tablet (10 mg total) by mouth at bedtime as needed for muscle spasms. 02/17/19   Inez Catalina, MD  dapagliflozin propanediol (FARXIGA) 5 MG TABS tablet Take 5 mg by mouth daily. 12/16/18   Rutherford Guys, MD  diclofenac (VOLTAREN) 75 MG EC tablet TAKE 1 TABLET BY MOUTH TWICE A DAY 01/22/19   Hudnall, Sharyn Lull, MD  estradiol (ESTRACE) 0.5 MG tablet Take 1 tablet (0.5 mg total) by mouth daily. 06/26/18   Fontaine, Belinda Block, MD  gabapentin (NEURONTIN) 300  MG capsule Take 1 capsule (300 mg total) by mouth 2 (two) times daily. 02/18/19   Rutherford Guys, MD  losartan (COZAAR) 50 MG tablet Take 1 tablet (50 mg total) by mouth daily. 10/26/18   Rutherford Guys, MD  metFORMIN (GLUCOPHAGE) 1000 MG tablet Take 1 tablet (1,000 mg total) by mouth 2 (two) times daily with a meal. 11/16/18   Rutherford Guys, MD  venlafaxine XR (EFFEXOR-XR) 75 MG 24 hr capsule TAKE 1 CAPSULE (75 MG TOTAL) BY MOUTH DAILY WITH BREAKFAST. 03/02/19   Rutherford Guys, MD    Past Medical History:  Diagnosis Date  . Breast mass, left 07/2017  . Diabetes mellitus without complication ([place])   . Hyperlipidemia   . Migraines     Past Surgical History:  Procedure Laterality Date  . ANKLE SURGERY Left 10/15/2017  . BREAST BIOPSY Left 07/24/2017   Procedure: LEFT BREAST MASS EXCISIONAL BIOPSY;  Surgeon: Rolm Bookbinder, MD;  Location: [place];  Service: General;  Laterality: Left;  . DEBULKING N/A 10/02/2017   Procedure: DEBULKING;  Surgeon: Isabel Caprice, MD;  [facility];  Service: Gynecology;  Laterality: N/A;  . FOOT GANGLION EXCISION Left    age 72  . HYSTERECTOMY ABDOMINAL WITH SALPINGO-OOPHORECTOMY Bilateral 10/02/2017   Procedure: HYSTERECTOMY ABDOMINAL WITH SALPINGO-OOPHORECTOMY;  Surgeon: Isabel Caprice, MD;  [facility];  Service: Gynecology;  Laterality: Bilateral;  bilateral salpingo-oophorectomy with possible total abdominal hysterectomy  . LAPAROTOMY WITH STAGING N/A 10/02/2017   Procedure: LAPAROTOMY WITH STAGING/OMENTECTOMY;  Surgeon: Isabel Caprice, MD;  [facility];  Service: Gynecology;  Laterality: N/A;  . RE-EXCISION OF BREAST LUMPECTOMY Left 08/25/2017   Procedure: LEFT RE-EXCISION OF BREAST MARGINS ERAS PATHWAY;  Surgeon: Rolm Bookbinder, MD;  Location: [place];  Service: General;  Laterality: Left;    Social History   Tobacco Use  . Smoking status: Current Every Day Smoker    Packs/day: 0.50     Years: 30.00    Pack years: 15.00    Types: Cigarettes  . Smokeless tobacco: Never Used  . Tobacco comment: 1 daily  Substance Use Topics  . Alcohol use: No    Frequency: Never    Family History  Problem Relation Age of Onset  . Hyperlipidemia Mother   . Alcohol abuse Mother   . Cirrhosis Mother   . Diabetes Father   . Hyperlipidemia Father   . Hypertension Father   . Stroke Father   . Heart disease Father 40       CAD with stenting multiple  . Hyperlipidemia Brother   . Hypertension Brother   . Diabetes Sister   . Hyperlipidemia Sister   . Hypertension Sister   . Breast cancer Maternal Grandmother        Post menopausal  . Pancreatic cancer Paternal Aunt   . Pancreatic cancer Paternal Aunt     Review of Systems  Constitutional: Negative for chills and fever.  Respiratory: Negative for cough and shortness of breath.   Cardiovascular: Negative for chest pain, palpitations and leg swelling.  Gastrointestinal: Negative for abdominal pain, nausea and vomiting.  Genitourinary: Positive for urgency. Negative for dysuria.  per hpi   OBJECTIVE:  Today's Vitals   03/18/19 0908  BP: 132/83  Pulse: 93  Temp: 98.8 F (37.1 C)  SpO2: 96%  Weight: 208 lb (94.3 kg)  Height: 5\' 3"  (1.6 m)   Body mass index is 36.85 kg/m.   Physical Exam Vitals signs and nursing note reviewed.  Constitutional:      Appearance: She is well-developed.  HENT:     Head: Normocephalic and atraumatic.     Mouth/Throat:     Pharynx: No oropharyngeal exudate.  Eyes:     General: No scleral icterus.    Conjunctiva/sclera: Conjunctivae normal.     Pupils: Pupils are equal, round, and reactive to light.  Neck:     Musculoskeletal: Neck supple.  Cardiovascular:     Rate and Rhythm: Normal rate and regular rhythm.     Heart sounds: Normal heart sounds. No murmur. No friction rub. No gallop.   Pulmonary:     Effort: Pulmonary effort is normal.     Breath sounds: Normal breath sounds. No  wheezing or rales.  Skin:    General: Skin is warm and dry.  Neurological:     Mental Status: She is alert and oriented to person, place, and time.     Results for orders placed or performed in visit on 03/18/19 (from the past 24 hour(s))  POCT A1C     Status: Abnormal   Collection Time: 03/18/19  9:32 AM  Result Value Ref Range   Hemoglobin A1C 7.0 (A) 4.0 - 5.6 %   HbA1c POC (<> result, manual entry)     HbA1c, POC (prediabetic range)     HbA1c, POC (controlled diabetic range)      No  results found.   ASSESSMENT and PLAN  1. Type 2 diabetes mellitus with microalbuminuria, without long-term current use of insulin ([facility]) Controlled. Continue current regime.  Discussed carb counting.  - POCT A1C - Ambulatory referral to Ophthalmology  2. Depression with anxiety Not controlled. Increasing effexor.  3. Pure hypercholesterolemia Controlled. Continue current regime.  - Lipid panel - Comprehensive metabolic panel  Other orders - venlafaxine XR (EFFEXOR-XR) 150 MG 24 hr capsule; Take 1 capsule (150 mg total) by mouth daily with breakfast. - gabapentin (NEURONTIN) 300 MG capsule; Take 1 capsule (300 mg total) by mouth 3 (three) times daily.  Return in about 3 months (around 06/17/2019).    Rutherford Guys, MD Primary [facility] at [place], [place] [address] Ph.  [phone] Fax [phone]

## 2019-03-19 LAB — COMPREHENSIVE METABOLIC PANEL
ALT: 25 IU/L (ref 0–32)
AST: 15 IU/L (ref 0–40)
Albumin/Globulin Ratio: 2.1 (ref 1.2–2.2)
Albumin: 4.2 g/dL (ref 3.8–4.8)
Alkaline Phosphatase: 90 IU/L (ref 39–117)
BUN/Creatinine Ratio: 25 — ABNORMAL HIGH (ref 9–23)
BUN: 16 mg/dL (ref 6–24)
Bilirubin Total: 0.5 mg/dL (ref 0.0–1.2)
CO2: 21 mmol/L (ref 20–29)
Calcium: 9.6 mg/dL (ref 8.7–10.2)
Chloride: 103 mmol/L (ref 96–106)
Creatinine, Ser: 0.63 mg/dL (ref 0.57–1.00)
GFR calc Af Amer: 124 mL/min/{1.73_m2} (ref >59)
GFR calc non Af Amer: 108 mL/min/{1.73_m2} (ref >59)
Globulin, Total: 2 g/dL (ref 1.5–4.5)
Glucose: 176 mg/dL — ABNORMAL HIGH (ref 65–99)
Potassium: 4.4 mmol/L (ref 3.5–5.2)
Sodium: 137 mmol/L (ref 134–144)
Total Protein: 6.2 g/dL (ref 6.0–8.5)

## 2019-03-19 LAB — LIPID PANEL
Chol/HDL Ratio: 4.2 ratio (ref 0.0–4.4)
Cholesterol, Total: 196 mg/dL (ref 100–199)
HDL: 47 mg/dL (ref >39)
LDL Chol Calc (NIH): 72 mg/dL (ref 0–99)
Triglycerides: 491 mg/dL — ABNORMAL HIGH (ref 0–149)
VLDL Cholesterol Cal: 77 mg/dL — ABNORMAL HIGH (ref 5–40)

## 2019-03-22 ENCOUNTER — Other Ambulatory Visit: Payer: Self-pay

## 2019-03-22 MED ORDER — DICLOFENAC SODIUM 75 MG PO TBEC: 75.0000 mg | DELAYED_RELEASE_TABLET | Freq: Two times a day (BID) | ORAL | 1 refills | Status: DC

## 2019-03-23 ENCOUNTER — Other Ambulatory Visit: Payer: Self-pay

## 2019-03-23 ENCOUNTER — Ambulatory Visit: Payer: 59 | Admitting: Physical Therapy

## 2019-03-23 ENCOUNTER — Encounter: Payer: Self-pay | Admitting: Gynecology

## 2019-03-23 DIAGNOSIS — R262 Difficulty in walking, not elsewhere classified: Secondary | ICD-10-CM

## 2019-03-23 DIAGNOSIS — M545 Low back pain, unspecified: Secondary | ICD-10-CM

## 2019-03-23 DIAGNOSIS — M542 Cervicalgia: Secondary | ICD-10-CM | POA: Diagnosis not present

## 2019-03-23 DIAGNOSIS — R600 Localized edema: Secondary | ICD-10-CM

## 2019-03-23 DIAGNOSIS — M25572 Pain in left ankle and joints of left foot: Secondary | ICD-10-CM

## 2019-03-23 DIAGNOSIS — G8929 Other chronic pain: Secondary | ICD-10-CM

## 2019-03-23 NOTE — Therapy (Signed)
[place], [place], [address] Phone: [phone]   Fax:  [phone]  Physical Therapy Treatment  Patient Details  Name: LONNY WIDDOWSON MRN: [id number] Date of Birth: December 17, 1970 Referring Provider (PT): Dene Gentry, MD   Encounter Date: 03/23/2019  PT End of Session - 03/23/19 1248    Visit Number  4    Number of Visits  12    Date for PT Re-Evaluation  04/15/19    Authorization Type  UHC    PT Start Time  1215    PT Stop Time  1257    PT Time Calculation (min)  42 min    Activity Tolerance  Patient tolerated treatment well    Behavior During Therapy  [facility] for tasks assessed/performed       Past Medical History:  Diagnosis Date  . Breast mass, left 07/2017  . Diabetes mellitus without complication ([place])   . Hyperlipidemia   . Migraines     Past Surgical History:  Procedure Laterality Date  . ANKLE SURGERY Left 10/15/2017  . BREAST BIOPSY Left 07/24/2017   Procedure: LEFT BREAST MASS EXCISIONAL BIOPSY;  Surgeon: Rolm Bookbinder, MD;  Location: [place];  Service: General;  Laterality: Left;  . DEBULKING N/A 10/02/2017   Procedure: DEBULKING;  Surgeon: Isabel Caprice, MD;  [facility];  Service: Gynecology;  Laterality: N/A;  . FOOT GANGLION EXCISION Left    age 48  . HYSTERECTOMY ABDOMINAL WITH SALPINGO-OOPHORECTOMY Bilateral 10/02/2017   Procedure: HYSTERECTOMY ABDOMINAL WITH SALPINGO-OOPHORECTOMY;  Surgeon: Isabel Caprice, MD;  [facility];  Service: Gynecology;  Laterality: Bilateral;  bilateral salpingo-oophorectomy with possible total abdominal hysterectomy  . LAPAROTOMY WITH STAGING N/A 10/02/2017   Procedure: LAPAROTOMY WITH STAGING/OMENTECTOMY;  Surgeon: Isabel Caprice, MD;  [facility];  Service: Gynecology;  Laterality: N/A;  . RE-EXCISION OF BREAST LUMPECTOMY Left 08/25/2017   Procedure: LEFT RE-EXCISION OF BREAST MARGINS ERAS PATHWAY;  Surgeon:  Rolm Bookbinder, MD;  Location: [place];  Service: General;  Laterality: Left;    There were no vitals filed for this visit.  Subjective Assessment - 03/23/19 1239    Subjective  Relays the Lt ankle is really bad today and had to wear slippers in, her whole foot hurts and she is having more back pain today. Her neck is doing okay but some pain in her Rt shoulder    Pertinent History  Patient had an injection following her MRI which improved her symptoms by 60%    Limitations  Sitting;Lifting;Standing;Walking    How long can you sit comfortably?  10 min    How long can you stand comfortably?  hour    How long can you walk comfortably?  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 "  No recent imaging for her back    Patient Stated Goals  ease the pain    Currently in Pain?  Other (Comment)   does not rate intensity   Pain Onset  More than a month ago    Pain Onset  More than a month ago                       [facility] Adult PT Treatment/Exercise - 03/23/19 0001      Exercises  Exercises  Neck;Lumbar;Ankle      Neck Exercises: Supine   Neck Retraction  15 reps    Other Supine Exercise  Horizontal abd and bilat ER with red band X 15 ea      Lumbar Exercises: Stretches   Single Knee to Chest Stretch  Right;Left;2 reps;30 seconds    Lower Trunk Rotation Limitations  10 reps X 3 sec hold    Piriformis Stretch  Right;Left;2 reps;30 seconds      Lumbar Exercises: Supine   Clam  20 reps    Clam Limitations  red    Bridge  20 reps      Modalities   Modalities  Ultrasound      Ultrasound   Ultrasound Location  Lt foot    Ultrasound Parameters  1.0 w/cm2, 100%, 3.45mhz with biofreeze    Ultrasound Goals  Pain      Manual Therapy   Manual therapy comments  Lt ankle PROM all planes, DF mobs and distraction  mobs, lumbar long axis distraction.       Ankle Exercises: Supine   T-Band  4 way with red X 15 ea,                  PT Long Term Goals - 03/16/19 1105      PT LONG TERM GOAL #1   Title  Pt will be I and compliant with HEP. (Target goal for all goals 6 weeks 04/15/19)    Baseline  just added HEP for Lt ankle today    Status  On-going      PT LONG TERM GOAL #2   Title  Pt will improve lumbar and neck ROM and Lt ankle ROM to [facility] to improve mobility    Baseline  revised to add in Lt ankle    Status  Revised      PT LONG TERM GOAL #3   Title  Pt will relay less often and more managed radicular symptoms.    Baseline  does relay this is improving some    Status  On-going      PT LONG TERM GOAL #4   Title  Pt will improve UE/LE strength to at least 5-/5 MMT to improve function    Status  On-going      PT LONG TERM GOAL #5   Title  Pt will reduce pain by overall 50%    Status  On-going            Plan - 03/23/19 1258    Clinical Impression Statement  She was in overall more pain today that started after working which caused more Lt foot/ankle swelling. She requested not to do vasopneumatic, she said it felt good while it was on but had some throbbing in her foot later on. Some of her foot pain may be lumbar radiculopathy. She was treated with stretching and strengthening for neck, ankle, back with lumbar decompression manual therapy techniques. Trialed U.S with biofreeeze to her Lt foot in efforts to decreased pain and inflammaiton    Personal Factors and Comorbidities  Comorbidity 1;Comorbidity 2;Past/Current Experience    Comorbidities  [id number] disc extrusion, DM, migranes, 2 breast surgeries, Lt ankle surgery    Examination-Participation Restrictions  Driving;Community Activity;Meal Prep;Laundry;Shop    Stability/Clinical Decision Making  Evolving/Moderate complexity    Rehab Potential  Fair    PT Frequency  2x / week    PT Duration  6 weeks    PT  Treatment/Interventions  Aquatic Therapy;Cryotherapy;Dealer  Stimulation;Iontophoresis 4mg /ml Dexamethasone;Moist Heat;Traction;Ultrasound;Therapeutic activities;Therapeutic exercise;Neuromuscular re-education;Manual techniques;Passive range of motion;Dry needling;Spinal Manipulations;Joint Manipulations    PT Next Visit Plan  how was U.S to her Lt ankle,  needs pain science education, and gentle exercise to start    PT Home Exercise Plan  Access Code: [email or web address] for neck and back, then added Lt ankle 4way with tband, gastroc stretch, foot extensor stretch, tandem balance       Patient will benefit from skilled therapeutic intervention in order to improve the following deficits and impairments:  Decreased endurance, Decreased range of motion, Decreased strength, Difficulty walking, Impaired flexibility, Increased muscle spasms, Increased fascial restricitons, Pain  Visit Diagnosis: Cervicalgia  Chronic bilateral low back pain without sciatica  Difficulty in walking, not elsewhere classified  Pain in left ankle and joints of left foot  Localized edema     Problem List Patient Active Problem List   Diagnosis Date Noted  . Hot flashes due to surgical menopause 12/28/2017  . Pure hypercholesterolemia 07/30/2017  . Type 2 diabetes mellitus without complication, without long-term current use of insulin ([place]) 07/30/2017  . Hematuria, microscopic 07/30/2017  . Menometrorrhagia 07/30/2017  . Ankle mass, left 07/30/2017  . Blood pressure elevated without history of HTN 07/30/2017  . Tobacco abuse 07/30/2017  . Breast mass, right 07/30/2017  . Class 1 obesity due to excess calories with serious comorbidity and body mass index (BMI) of 33.0 to 33.9 in adult 07/30/2017    Silvestre Mesi 03/23/2019, 1:02 PM  [facility] [address] [place], [place], [address] Phone: [phone]   Fax:  [phone]  Name: KADIE SEISER MRN:  [id number] Date of Birth: 02-19-1971

## 2019-03-25 ENCOUNTER — Other Ambulatory Visit: Payer: Self-pay

## 2019-03-25 ENCOUNTER — Ambulatory Visit: Payer: 59 | Admitting: Physical Therapy

## 2019-03-25 DIAGNOSIS — R600 Localized edema: Secondary | ICD-10-CM

## 2019-03-25 DIAGNOSIS — M542 Cervicalgia: Secondary | ICD-10-CM | POA: Diagnosis not present

## 2019-03-25 DIAGNOSIS — R262 Difficulty in walking, not elsewhere classified: Secondary | ICD-10-CM

## 2019-03-25 DIAGNOSIS — M545 Low back pain, unspecified: Secondary | ICD-10-CM

## 2019-03-25 DIAGNOSIS — M25572 Pain in left ankle and joints of left foot: Secondary | ICD-10-CM

## 2019-03-25 DIAGNOSIS — G8929 Other chronic pain: Secondary | ICD-10-CM

## 2019-03-25 NOTE — Therapy (Signed)
[place], [place], [address] Phone: [phone]   Fax:  [phone]  Physical Therapy Treatment  Patient Details  Name: Jody Duke MRN: [id number] Date of Birth: 1970-09-27 Referring Provider (PT): Dene Gentry, MD   Encounter Date: 03/25/2019  PT End of Session - 03/25/19 1043    Visit Number  5    Number of Visits  12    Date for PT Re-Evaluation  04/15/19    Authorization Type  UHC    PT Start Time  1000    PT Stop Time  1038    PT Time Calculation (min)  38 min    Activity Tolerance  Patient tolerated treatment well    Behavior During Therapy  [facility] for tasks assessed/performed       Past Medical History:  Diagnosis Date  . Breast mass, left 07/2017  . Diabetes mellitus without complication ([place])   . Hyperlipidemia   . Migraines     Past Surgical History:  Procedure Laterality Date  . ANKLE SURGERY Left 10/15/2017  . BREAST BIOPSY Left 07/24/2017   Procedure: LEFT BREAST MASS EXCISIONAL BIOPSY;  Surgeon: Rolm Bookbinder, MD;  Location: [place];  Service: General;  Laterality: Left;  . DEBULKING N/A 10/02/2017   Procedure: DEBULKING;  Surgeon: Isabel Caprice, MD;  [facility];  Service: Gynecology;  Laterality: N/A;  . FOOT GANGLION EXCISION Left    age 48  . HYSTERECTOMY ABDOMINAL WITH SALPINGO-OOPHORECTOMY Bilateral 10/02/2017   Procedure: HYSTERECTOMY ABDOMINAL WITH SALPINGO-OOPHORECTOMY;  Surgeon: Isabel Caprice, MD;  [facility];  Service: Gynecology;  Laterality: Bilateral;  bilateral salpingo-oophorectomy with possible total abdominal hysterectomy  . LAPAROTOMY WITH STAGING N/A 10/02/2017   Procedure: LAPAROTOMY WITH STAGING/OMENTECTOMY;  Surgeon: Isabel Caprice, MD;  [facility];  Service: Gynecology;  Laterality: N/A;  . RE-EXCISION OF BREAST LUMPECTOMY Left 08/25/2017   Procedure: LEFT RE-EXCISION OF BREAST MARGINS ERAS PATHWAY;  Surgeon:  Rolm Bookbinder, MD;  Location: [place];  Service: General;  Laterality: Left;    There were no vitals filed for this visit.  Subjective Assessment - 03/25/19 1038    Subjective  Lt ankle and back are feeling better after last session, but having more neck and Rt shoulder pain with radiculopathy in her Rt shoulder.    Pertinent History  Patient had an injection following her MRI which improved her symptoms by 60%    Limitations  Sitting;Lifting;Standing;Walking    How long can you sit comfortably?  10 min    How long can you stand comfortably?  hour    How long can you walk comfortably?  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 "  No recent imaging for her back    Patient Stated Goals  ease the pain    Currently in Pain?  Yes    Pain Score  7     Pain Location  Shoulder    Pain Orientation  Right    Pain Descriptors / Indicators  Aching;Tingling    Pain Type  Chronic pain    Pain Onset  More than a month ago    Pain Onset  More than a month ago  [place] Adult PT Treatment/Exercise - 03/25/19 0001      Neck Exercises: Supine   Neck Retraction  15 reps    Other Supine Exercise  supine wand flexon AAROM X 15      Modalities   Modalities  Traction      Traction   Type of Traction  Cervical    Min (lbs)  10    Max (lbs)  15    Time  15 min total pre set program      Manual Therapy   Manual therapy comments  Rt shoulder PROM, GH mobs (decent GH mobility but significant firm end feel going into flexion)             PT Education - 03/25/19 1042    Education Details  add supine AAROM for flexion into HEP due to limited ROM    Person(s) Educated  Patient    Methods  Explanation;Demonstration;Verbal cues    Comprehension  Verbalized understanding          PT Long Term  Goals - 03/16/19 1105      PT LONG TERM GOAL #1   Title  Pt will be I and compliant with HEP. (Target goal for all goals 6 weeks 04/15/19)    Baseline  just added HEP for Lt ankle today    Status  On-going      PT LONG TERM GOAL #2   Title  Pt will improve lumbar and neck ROM and Lt ankle ROM to [facility] to improve mobility    Baseline  revised to add in Lt ankle    Status  Revised      PT LONG TERM GOAL #3   Title  Pt will relay less often and more managed radicular symptoms.    Baseline  does relay this is improving some    Status  On-going      PT LONG TERM GOAL #4   Title  Pt will improve UE/LE strength to at least 5-/5 MMT to improve function    Status  On-going      PT LONG TERM GOAL #5   Title  Pt will reduce pain by overall 50%    Status  On-going            Plan - 03/25/19 1043    Clinical Impression Statement  her back and Lt ankle were doing good today and responded well to treatment last time. Session today focused on her neck and Rt shoulder as this was her biggest complaint. She lacks Rt shoulder ROM and has significant more firm end feel with flexion ROM. She was shown AAROM with wand to add at home to increase her Rt shoulder ROM and the importance of this was stressed so she does not get frozen shoulder. Trialed cervical traction today to decreased neck pain and radiculopathy.    Personal Factors and Comorbidities  Comorbidity 1;Comorbidity 2;Past/Current Experience    Comorbidities  [id number] disc extrusion, DM, migranes, 2 breast surgeries, Lt ankle surgery    Examination-Participation Restrictions  Driving;Community Activity;Meal Prep;Laundry;Shop    Stability/Clinical Decision Making  Evolving/Moderate complexity    Rehab Potential  Fair    PT Frequency  2x / week    PT Duration  6 weeks    PT Treatment/Interventions  Aquatic Therapy;Cryotherapy;Electrical Stimulation;Iontophoresis 4mg /ml Dexamethasone;Moist Heat;Traction;Ultrasound;Therapeutic  activities;Therapeutic exercise;Neuromuscular re-education;Manual techniques;Passive range of motion;Dry needling;Spinal Manipulations;Joint Manipulations    PT Next Visit Plan  how was U.S to her Lt ankle,  needs pain  science education, and gentle exercise to start    PT Home Exercise Plan  Access Code: [email or web address] for neck and back, then added Lt ankle 4way with tband, gastroc stretch, foot extensor stretch, tandem balance       Patient will benefit from skilled therapeutic intervention in order to improve the following deficits and impairments:  Decreased endurance, Decreased range of motion, Decreased strength, Difficulty walking, Impaired flexibility, Increased muscle spasms, Increased fascial restricitons, Pain  Visit Diagnosis: Cervicalgia  Chronic bilateral low back pain without sciatica  Difficulty in walking, not elsewhere classified  Pain in left ankle and joints of left foot  Localized edema     Problem List Patient Active Problem List   Diagnosis Date Noted  . Hot flashes due to surgical menopause 12/28/2017  . Pure hypercholesterolemia 07/30/2017  . Type 2 diabetes mellitus without complication, without long-term current use of insulin ([place]) 07/30/2017  . Hematuria, microscopic 07/30/2017  . Menometrorrhagia 07/30/2017  . Ankle mass, left 07/30/2017  . Blood pressure elevated without history of HTN 07/30/2017  . Tobacco abuse 07/30/2017  . Breast mass, right 07/30/2017  . Class 1 obesity due to excess calories with serious comorbidity and body mass index (BMI) of 33.0 to 33.9 in adult 07/30/2017    Jody Duke 03/25/2019, 10:46 AM  [facility] [address] [place], [place], [address] Phone: [phone]   Fax:  [phone]  Name: Jody Duke MRN: [id number] Date of Birth: 1971-04-06

## 2019-03-26 ENCOUNTER — Other Ambulatory Visit: Payer: Self-pay | Admitting: Sports Medicine

## 2019-03-30 ENCOUNTER — Ambulatory Visit: Payer: 59 | Admitting: Physical Therapy

## 2019-03-30 ENCOUNTER — Other Ambulatory Visit: Payer: Self-pay

## 2019-03-30 DIAGNOSIS — M25572 Pain in left ankle and joints of left foot: Secondary | ICD-10-CM

## 2019-03-30 DIAGNOSIS — M542 Cervicalgia: Secondary | ICD-10-CM | POA: Diagnosis not present

## 2019-03-30 DIAGNOSIS — G8929 Other chronic pain: Secondary | ICD-10-CM

## 2019-03-30 DIAGNOSIS — R600 Localized edema: Secondary | ICD-10-CM

## 2019-03-30 DIAGNOSIS — M545 Low back pain, unspecified: Secondary | ICD-10-CM

## 2019-03-30 DIAGNOSIS — R262 Difficulty in walking, not elsewhere classified: Secondary | ICD-10-CM

## 2019-03-30 LAB — HM DIABETES EYE EXAM

## 2019-03-30 NOTE — Therapy (Signed)
[place], [place], [address] Phone: [phone]   Fax:  [phone]  Physical Therapy Treatment  Patient Details  Name: Jody Duke MRN: [id number] Date of Birth: Sep 24, 1970 Referring Provider (PT): Dene Gentry, MD   Encounter Date: 03/30/2019  PT End of Session - 03/30/19 1311    Visit Number  6    Number of Visits  12    Date for PT Re-Evaluation  04/15/19    Authorization Type  UHC    PT Start Time  [id number]    PT Stop Time  1135    PT Time Calculation (min)  50 min    Activity Tolerance  Patient tolerated treatment well    Behavior During Therapy  [facility] for tasks assessed/performed       Past Medical History:  Diagnosis Date  . Breast mass, left 07/2017  . Diabetes mellitus without complication ([place])   . Hyperlipidemia   . Migraines     Past Surgical History:  Procedure Laterality Date  . ANKLE SURGERY Left 10/15/2017  . BREAST BIOPSY Left 07/24/2017   Procedure: LEFT BREAST MASS EXCISIONAL BIOPSY;  Surgeon: Rolm Bookbinder, MD;  Location: [place];  Service: General;  Laterality: Left;  . DEBULKING N/A 10/02/2017   Procedure: DEBULKING;  Surgeon: Isabel Caprice, MD;  [facility];  Service: Gynecology;  Laterality: N/A;  . FOOT GANGLION EXCISION Left    age 48  . HYSTERECTOMY ABDOMINAL WITH SALPINGO-OOPHORECTOMY Bilateral 10/02/2017   Procedure: HYSTERECTOMY ABDOMINAL WITH SALPINGO-OOPHORECTOMY;  Surgeon: Isabel Caprice, MD;  [facility];  Service: Gynecology;  Laterality: Bilateral;  bilateral salpingo-oophorectomy with possible total abdominal hysterectomy  . LAPAROTOMY WITH STAGING N/A 10/02/2017   Procedure: LAPAROTOMY WITH STAGING/OMENTECTOMY;  Surgeon: Isabel Caprice, MD;  [facility];  Service: Gynecology;  Laterality: N/A;  . RE-EXCISION OF BREAST LUMPECTOMY Left 08/25/2017   Procedure: LEFT RE-EXCISION OF BREAST MARGINS ERAS PATHWAY;  Surgeon:  Rolm Bookbinder, MD;  Location: [place];  Service: General;  Laterality: Left;    There were no vitals filed for this visit.  Subjective Assessment - 03/30/19 1129    Subjective  neck and shoulder and back doing some better but still with having some pain and Rt hand radiculopathy. Her Lt ankle is biggest complaint with pain and swelling after being on her feet at work          [facility] Adult PT Treatment/Exercise - 03/30/19 0001      Modalities   Modalities  Traction      Ultrasound   Ultrasound Location  Lt ankle    Ultrasound Parameters  3.67mhz, 100%, 1.0 w/cm2 with biofreeze 8 min total    Ultrasound Goals  Edema;Pain      Traction   Type of Traction  Cervical    Min (lbs)  10    Max (lbs)  15    Time  15 min total pre set program      Vasopneumatic   Number Minutes Vasopneumatic   15 minutes    Vasopnuematic Location   Ankle    Vasopneumatic Pressure  Medium    Vasopneumatic Temperature   34      Manual Therapy   Manual therapy comments  long axis distraction for lumbar, Lt ankle PROM and DF mobs and passive heel cord stretching      Ankle Exercises: Supine   T-Band  4 way with green X 15 ea,  PT Long Term Goals - 03/16/19 1105      PT LONG TERM GOAL #1   Title  Pt will be I and compliant with HEP. (Target goal for all goals 6 weeks 04/15/19)    Baseline  just added HEP for Lt ankle today    Status  On-going      PT LONG TERM GOAL #2   Title  Pt will improve lumbar and neck ROM and Lt ankle ROM to [facility] to improve mobility    Baseline  revised to add in Lt ankle    Status  Revised      PT LONG TERM GOAL #3   Title  Pt will relay less often and more managed radicular symptoms.    Baseline  does relay this is improving some    Status  On-going      PT LONG TERM GOAL #4   Title  Pt will improve UE/LE strength to at least 5-/5 MMT to improve function    Status  On-going      PT LONG TERM GOAL #5   Title  Pt will reduce pain by  overall 50%    Status  On-going            Plan - 03/30/19 1311    Clinical Impression Statement  Lt ankle pain was biggest complaint and had more edema today so session focused more on ankle with stretching, mobilizations, strengthening, and modalaties. Cervical traction resumed per her request for neck pain and Rt arm radiculopathy. Continue POC    Personal Factors and Comorbidities  Comorbidity 1;Comorbidity 2;Past/Current Experience    Comorbidities  [id number] disc extrusion, DM, migranes, 2 breast surgeries, Lt ankle surgery    Examination-Participation Restrictions  Driving;Community Activity;Meal Prep;Laundry;Shop    Stability/Clinical Decision Making  Evolving/Moderate complexity    Rehab Potential  Fair    PT Frequency  2x / week    PT Duration  6 weeks    PT Treatment/Interventions  Aquatic Therapy;Cryotherapy;Electrical Stimulation;Iontophoresis 4mg /ml Dexamethasone;Moist Heat;Traction;Ultrasound;Therapeutic activities;Therapeutic exercise;Neuromuscular re-education;Manual techniques;Passive range of motion;Dry needling;Spinal Manipulations;Joint Manipulations    PT Next Visit Plan  how was U.S to her Lt ankle,  needs pain science education, and gentle exercise to start    PT Home Exercise Plan  Access Code: [email or web address] for neck and back, then added Lt ankle 4way with tband, gastroc stretch, foot extensor stretch, tandem balance       Patient will benefit from skilled therapeutic intervention in order to improve the following deficits and impairments:  Decreased endurance, Decreased range of motion, Decreased strength, Difficulty walking, Impaired flexibility, Increased muscle spasms, Increased fascial restricitons, Pain  Visit Diagnosis: Cervicalgia  Chronic bilateral low back pain without sciatica  Difficulty in walking, not elsewhere classified  Pain in left ankle and joints of left foot  Localized edema     Problem List Patient Active Problem List   Diagnosis  Date Noted  . Hot flashes due to surgical menopause 12/28/2017  . Pure hypercholesterolemia 07/30/2017  . Type 2 diabetes mellitus without complication, without long-term current use of insulin ([place]) 07/30/2017  . Hematuria, microscopic 07/30/2017  . Menometrorrhagia 07/30/2017  . Ankle mass, left 07/30/2017  . Blood pressure elevated without history of HTN 07/30/2017  . Tobacco abuse 07/30/2017  . Breast mass, right 07/30/2017  . Class 1 obesity due to excess calories with serious comorbidity and body mass index (BMI) of 33.0 to 33.9 in adult 07/30/2017    Silvestre Mesi 03/30/2019, 1:14 PM  [place], [place], [address] Phone: [phone]   Fax:  [phone]  Name: Jody Duke MRN: [id number] Date of Birth: Apr 20, 1971

## 2019-04-01 ENCOUNTER — Ambulatory Visit: Payer: 59 | Attending: Family Medicine | Admitting: Physical Therapy

## 2019-04-01 ENCOUNTER — Other Ambulatory Visit: Payer: Self-pay

## 2019-04-01 DIAGNOSIS — R262 Difficulty in walking, not elsewhere classified: Secondary | ICD-10-CM | POA: Diagnosis present

## 2019-04-01 DIAGNOSIS — M25572 Pain in left ankle and joints of left foot: Secondary | ICD-10-CM

## 2019-04-01 DIAGNOSIS — M545 Low back pain, unspecified: Secondary | ICD-10-CM

## 2019-04-01 DIAGNOSIS — G8929 Other chronic pain: Secondary | ICD-10-CM

## 2019-04-01 DIAGNOSIS — R600 Localized edema: Secondary | ICD-10-CM | POA: Insufficient documentation

## 2019-04-01 DIAGNOSIS — M542 Cervicalgia: Secondary | ICD-10-CM | POA: Diagnosis not present

## 2019-04-01 NOTE — Therapy (Signed)
[place], [place], [address] Phone: [phone]   Fax:  [phone]  Physical Therapy Treatment  Patient Details  Name: Jody Duke MRN: [id number] Date of Birth: [birth year] Referring Provider (PT): Dene Gentry, MD   Encounter Date: 04/01/2019  PT End of Session - 04/01/19 1105    Visit Number  7    Number of Visits  12    Date for PT Re-Evaluation  04/15/19    Authorization Type  [employer]    PT Start Time  1005    PT Stop Time  1045    PT Time Calculation (min)  40 min    Activity Tolerance  Patient tolerated treatment well    Behavior During Therapy  [facility] for tasks assessed/performed       Past Medical History:  Diagnosis Date  . Breast mass, left 07/2017  . Diabetes mellitus without complication ([place])   . Hyperlipidemia   . Migraines     Past Surgical History:  Procedure Laterality Date  . ANKLE SURGERY Left 10/15/2017  . BREAST BIOPSY Left 07/24/2017   Procedure: LEFT BREAST MASS EXCISIONAL BIOPSY;  Surgeon: Rolm Bookbinder, MD;  Location: [place];  Service: General;  Laterality: Left;  . DEBULKING N/A 10/02/2017   Procedure: DEBULKING;  Surgeon: Isabel Caprice, MD;  Location[facility];  Service: Gynecology;  Laterality: N/A;  . FOOT GANGLION EXCISION Left    age 48  . HYSTERECTOMY ABDOMINAL WITH SALPINGO-OOPHORECTOMY Bilateral 10/02/2017   Procedure: HYSTERECTOMY ABDOMINAL WITH SALPINGO-OOPHORECTOMY;  Surgeon: Isabel Caprice, MD;  Location[facility];  Service: Gynecology;  Laterality: Bilateral;  bilateral salpingo-oophorectomy with possible total abdominal hysterectomy  . LAPAROTOMY WITH STAGING N/A 10/02/2017   Procedure: LAPAROTOMY WITH STAGING/OMENTECTOMY;  Surgeon: Isabel Caprice, MD;  [facility];  Service: Gynecology;  Laterality: N/A;  . RE-EXCISION OF BREAST LUMPECTOMY Left 08/25/2017   Procedure: LEFT RE-EXCISION OF BREAST MARGINS ERAS PATHWAY;  Surgeon:  Rolm Bookbinder, MD;  Location: [place];  Service: General;  Laterality: Left;    There were no vitals filed for this visit.  Subjective Assessment - 04/01/19 1101    Subjective  ankle doing better but back and neck are bothering her with some N/T in her first 3 fingers in Rt hand    Pertinent History  Patient had an injection following her MRI which improved her symptoms by 60%    Limitations  Sitting;Lifting;Standing;Walking    How long can you sit comfortably?  10 min    How long can you stand comfortably?  hour    How long can you walk comfortably?  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 "  No recent imaging for her back    Patient Stated Goals  ease the pain    Currently in Pain?  Yes    Pain Score  6     Pain Location  Back   and neck and Rt shoulder   Pain Onset  More than a month ago    Pain Onset  More than a month ago                       [facility] Adult PT Treatment/Exercise - 04/01/19 0001      Exercises   Exercises  Other Exercises    Other Exercises   performed supine chin tucks X 20 then 15 reps of median nerve glide and radial nerve glides all in supine      Modalities   Modalities  Traction;Moist Heat;Electrical Stimulation      Moist Heat Therapy   Number Minutes Moist Heat  15 Minutes    Moist Heat Location  Lumbar Spine      Electrical Stimulation   Electrical Stimulation Location  lumbar    Electrical Stimulation Action  IFC    Electrical Stimulation Parameters  supine with legs elevated    Electrical Stimulation Goals  Pain      Traction   Type of Traction  Cervical    Min (lbs)  11    Max (lbs)  16    Time  15 min total pre set program      Manual Therapy   Manual therapy comments  cervical-thoracic standing mobilization and attempted manipulation but unable to get  cavitation.             PT Education - 04/01/19 1105    Education Details  added upper limb nerve glides into HEP for radiculopathy    Person(s) Educated  Patient    Methods  Explanation;Demonstration;Verbal cues;Handout    Comprehension  Verbalized understanding;Returned demonstration          PT Long Term Goals - 03/16/19 1105      PT LONG TERM GOAL #1   Title  Pt will be I and compliant with HEP. (Target goal for all goals 6 weeks 04/15/19)    Baseline  just added HEP for Lt ankle today    Status  On-going      PT LONG TERM GOAL #2   Title  Pt will improve lumbar and neck ROM and Lt ankle ROM to [facility] to improve mobility    Baseline  revised to add in Lt ankle    Status  Revised      PT LONG TERM GOAL #3   Title  Pt will relay less often and more managed radicular symptoms.    Baseline  does relay this is improving some    Status  On-going      PT LONG TERM GOAL #4   Title  Pt will improve UE/LE strength to at least 5-/5 MMT to improve function    Status  On-going      PT LONG TERM GOAL #5   Title  Pt will reduce pain by overall 50%    Status  On-going            Plan - 04/01/19 1106    Clinical Impression Statement  Session focused more on back and neck pain today as this was her biggest complaint. She had some Rt UE radicular symptoms and N/T in her first 3 fingers so was given median and radial nerve glides to trial at home and this combined with traction helped ease radiculopathy some but still present. Continue POC    Personal Factors and Comorbidities  Comorbidity 1;Comorbidity 2;Past/Current Experience    Comorbidities  [id number] disc extrusion, DM, migranes, 2 breast surgeries, Lt ankle surgery    Examination-Participation Restrictions  Driving;Community Activity;Meal Prep;Laundry;Shop    Stability/Clinical Decision Making  Evolving/Moderate complexity    Rehab Potential  Fair    PT Frequency  2x / week    PT Duration  6 weeks    PT  Treatment/Interventions  Aquatic Therapy;Cryotherapy;Electrical Stimulation;Iontophoresis 4mg /ml Dexamethasone;Moist Heat;Traction;Ultrasound;Therapeutic activities;Therapeutic exercise;Neuromuscular  re-education;Manual techniques;Passive range of motion;Dry needling;Spinal Manipulations;Joint Manipulations    PT Next Visit Plan  how was nerve glides,  needs pain science education, and gentle exercise to start    PT Home Exercise Plan  Access Code: [email or web address] for neck and back, then added Lt ankle 4way with tband, gastroc stretch, foot extensor stretch, tandem balance, added radial and medial n glides    Consulted and Agree with Plan of Care  Patient       Patient will benefit from skilled therapeutic intervention in order to improve the following deficits and impairments:  Decreased endurance, Decreased range of motion, Decreased strength, Difficulty walking, Impaired flexibility, Increased muscle spasms, Increased fascial restricitons, Pain  Visit Diagnosis: Cervicalgia  Chronic bilateral low back pain without sciatica  Difficulty in walking, not elsewhere classified  Pain in left ankle and joints of left foot  Localized edema     Problem List Patient Active Problem List   Diagnosis Date Noted  . Hot flashes due to surgical menopause 12/28/2017  . Pure hypercholesterolemia 07/30/2017  . Type 2 diabetes mellitus without complication, without long-term current use of insulin ([place]) 07/30/2017  . Hematuria, microscopic 07/30/2017  . Menometrorrhagia 07/30/2017  . Ankle mass, left 07/30/2017  . Blood pressure elevated without history of HTN 07/30/2017  . Tobacco abuse 07/30/2017  . Breast mass, right 07/30/2017  . Class 1 obesity due to excess calories with serious comorbidity and body mass index (BMI) of 33.0 to 33.9 in adult 07/30/2017    Debbe Odea ,PT,DPT 04/01/2019, 11:09 AM  [place], [place],  [address] Phone: [phone]   Fax:  [phone]  Name: ALYSAH VIAL MRN: [id number] Date of Birth: Nov 07, 1970

## 2019-04-01 NOTE — Patient Instructions (Signed)
Access Code: [email or web address]  URL: [email or web address]  Date: 04/01/2019  Prepared by: Elsie Ra   Exercises  Standing Median Nerve Glide - 10 reps - 1-2 sets - 2x daily - 6x weekly  Radial Nerve Flossing - 10 reps - 1-2 sets - 2x daily - 6x weekly

## 2019-04-01 NOTE — Progress Notes (Signed)
[facility] Note  04/02/2019     CHIEF COMPLAINT Patient presents for Retina Evaluation   HISTORY OF PRESENT ILLNESS: Jody Duke is a 48 y.o. female who presents to the clinic today for:   HPI    Retina Evaluation    In left eye.  This started weeks ago.  Duration of weeks.  Associated Symptoms Floaters.  Context:  distance vision and near vision.  I, the attending physician,  performed the HPI with the patient and updated documentation appropriately.          Comments    BS this AM: 150 Last HgA1c: 7.0 (Was as high as 9 per patient) Patient states her vision is changing for reading and hasnt been able to read small print over the last several months.  Patient complains of new floaters OU but especially OS.  Patient denies eye pain or discomfort and denies flashes of light.  Patient reports that her Father and his siblings have all had a history of diabetic retinopathy with history of laser and surgery with Father.       Last edited by Bernarda Caffey, MD on 04/03/2019  9:42 PM. (History)    pt states she saw Shirleen Schirmer for routine eye exam, she states she is having trouble seeing up close, pt endorses being hypertensive and diabetic  Referring physician: Shirleen Schirmer, PA-C [address] [place],  [place] [address]  HISTORICAL INFORMATION:   Selected notes from the [facility] Patient being referred by Shirleen Schirmer, PA for retinal break OS.  Last eye exam: 09.29.20  BCVA: 20/20 OU  Medical history: DM, HTN  Last a1c: 7.0 on 09.17.20   CURRENT MEDICATIONS: Current Outpatient Medications (Ophthalmic Drugs)  Medication Sig  . prednisoLONE acetate (PRED FORTE) 1 % ophthalmic suspension Place 1 drop into the right eye 4 (four) times daily for 7 days.   No current facility-administered medications for this visit.  (Ophthalmic Drugs)   Current Outpatient Medications (Other)  Medication Sig  . atorvastatin  (LIPITOR) 40 MG tablet Take 1 tablet (40 mg total) by mouth daily.  . cyclobenzaprine (FLEXERIL) 10 MG tablet Take 1 tablet (10 mg total) by mouth at bedtime as needed for muscle spasms.  . dapagliflozin propanediol (FARXIGA) 5 MG TABS tablet Take 5 mg by mouth daily.  . diclofenac (VOLTAREN) 75 MG EC tablet Take 1 tablet (75 mg total) by mouth 2 (two) times daily.  Marland Kitchen estradiol (ESTRACE) 0.5 MG tablet Take 1 tablet (0.5 mg total) by mouth daily.  Marland Kitchen gabapentin (NEURONTIN) 300 MG capsule Take 1 capsule (300 mg total) by mouth 3 (three) times daily.  Marland Kitchen losartan (COZAAR) 50 MG tablet Take 1 tablet (50 mg total) by mouth daily.  . metFORMIN (GLUCOPHAGE) 1000 MG tablet Take 1 tablet (1,000 mg total) by mouth 2 (two) times daily with a meal.  . venlafaxine XR (EFFEXOR-XR) 150 MG 24 hr capsule Take 1 capsule (150 mg total) by mouth daily with breakfast.   No current facility-administered medications for this visit.  (Other)      REVIEW OF SYSTEMS: ROS    Positive for: Endocrine, Eyes   Negative for: Constitutional, Gastrointestinal, Neurological, Skin, Genitourinary, Musculoskeletal, HENT, Cardiovascular, Respiratory, Psychiatric, Allergic/Imm, Heme/Lymph   Last edited by Doneen Poisson on 04/02/2019  8:43 AM. (History)       ALLERGIES Allergies  Allergen Reactions  . Percocet [Oxycodone-Acetaminophen] Nausea And Vomiting    PAST MEDICAL HISTORY  Past Medical History:  Diagnosis Date  . Breast mass, left 07/2017  . Diabetes mellitus without complication ([place])   . Hyperlipidemia   . Migraines    Past Surgical History:  Procedure Laterality Date  . ANKLE SURGERY Left 10/15/2017  . BREAST BIOPSY Left 07/24/2017   Procedure: LEFT BREAST MASS EXCISIONAL BIOPSY;  Surgeon: Rolm Bookbinder, MD;  Location: [place];  Service: General;  Laterality: Left;  . DEBULKING N/A 10/02/2017   Procedure: DEBULKING;  Surgeon: Isabel Caprice, MD;  [facility];  Service:  Gynecology;  Laterality: N/A;  . FOOT GANGLION EXCISION Left    age 28  . HYSTERECTOMY ABDOMINAL WITH SALPINGO-OOPHORECTOMY Bilateral 10/02/2017   Procedure: HYSTERECTOMY ABDOMINAL WITH SALPINGO-OOPHORECTOMY;  Surgeon: Isabel Caprice, MD;  [facility];  Service: Gynecology;  Laterality: Bilateral;  bilateral salpingo-oophorectomy with possible total abdominal hysterectomy  . LAPAROTOMY WITH STAGING N/A 10/02/2017   Procedure: LAPAROTOMY WITH STAGING/OMENTECTOMY;  Surgeon: Isabel Caprice, MD;  [facility];  Service: Gynecology;  Laterality: N/A;  . RE-EXCISION OF BREAST LUMPECTOMY Left 08/25/2017   Procedure: LEFT RE-EXCISION OF BREAST MARGINS ERAS PATHWAY;  Surgeon: Rolm Bookbinder, MD;  Location: [facility] [facility];  Service: General;  Laterality: Left;    FAMILY HISTORY Family History  Problem Relation Age of Onset  . Hyperlipidemia Mother   . Alcohol abuse Mother   . Cirrhosis Mother   . Diabetes Father   . Hyperlipidemia Father   . Hypertension Father   . Stroke Father   . Heart disease Father 27       CAD with stenting multiple  . Hyperlipidemia Brother   . Hypertension Brother   . Diabetes Sister   . Hyperlipidemia Sister   . Hypertension Sister   . Breast cancer Maternal Grandmother        Post menopausal  . Pancreatic cancer Paternal Aunt   . Pancreatic cancer Paternal Aunt     SOCIAL HISTORY Social History   Tobacco Use  . Smoking status: Current Every Day Smoker    Packs/day: 0.50    Years: 30.00    Pack years: 15.00    Types: Cigarettes  . Smokeless tobacco: Never Used  . Tobacco comment: 1 daily  Substance Use Topics  . Alcohol use: No    Frequency: Never  . Drug use: No         OPHTHALMIC EXAM:  Base Eye Exam    Visual Acuity (Snellen - Linear)      Right Left   Dist [place] 20/20 20/25 -2   Dist ph [place]  NI       Tonometry (Tonopen, 8:49 AM)      Right Left   Pressure 14 16       Pupils      Dark Light Shape React APD    Right 3 2 Round Brisk 0   Left 3 2 Round Brisk 0       Visual Fields      Left Right    Full Full       Extraocular Movement      Right Left    Full Full       Neuro/Psych    Oriented x3: Yes   Mood/Affect: Normal       Dilation    Both eyes: 1.0% Mydriacyl, 2.5% Phenylephrine @ 8:50 AM        Slit Lamp and Fundus Exam    Slit Lamp Exam  Right Left   Lids/Lashes Dermatochalasis - upper lid Dermatochalasis - upper lid   Conjunctiva/Sclera White and quiet Mild nasal and temporal Pinguecula   Cornea Trace Punctate epithelial erosions, Debris in tear film 3-4+Punctate epithelial erosions inferiorly, Debris in tear film   Anterior Chamber Deep and quiet Deep and quiet   Iris Round and dilated Round and dilated   Lens 1+ Nuclear sclerosis, 1+ Cortical cataract 1+ Nuclear sclerosis, 1+ Cortical cataract   Vitreous Mild Vitreous syneresis Mild Vitreous syneresis       Fundus Exam      Right Left   Disc Pink and Sharp, temporal PPP Pink and Sharp, temporal PPP   C/D Ratio 0.1 0.1   Macula Flat, Good foveal reflex, mild RPE mottling and clumping Flat, good foveal reflex, mild RPE mottling and clumping, No heme or edema   Vessels Vascular attenuation, Tortuousity Vascular attenuation, Tortuousity   Periphery Attached, lattice with atrophic holes at 1200 and from 0730-0800 Attached, mild patch of lattice at 0130, pigmented lattice with atrophic hole at 0300 and from 0530-0600        Refraction    Manifest Refraction      Sphere Cylinder Axis Dist VA   Right -0.25 +0.25 050 20/20   Left -0.75 Sphere  20/25+2          IMAGING AND PROCEDURES  Imaging and Procedures for @TODAY @  OCT, Retina - OU - Both Eyes       Right Eye Quality was good. Central Foveal Thickness: 292. Progression has no prior data. Findings include normal foveal contour, no IRF, no SRF.   Left Eye Quality was good. Central Foveal Thickness: 258. Progression has no prior data. Findings include  normal foveal contour, no IRF, no SRF.   Notes *Images captured and stored on drive  Diagnosis / Impression:  NFP; no IRF/SRF  Clinical management:  See below  Abbreviations: NFP - Normal foveal profile. CME - cystoid macular edema. PED - pigment epithelial detachment. IRF - intraretinal fluid. SRF - subretinal fluid. EZ - ellipsoid zone. ERM - epiretinal membrane. ORA - outer retinal atrophy. ORT - outer retinal tubulation. SRHM - subretinal hyper-reflective material                 ASSESSMENT/PLAN:    ICD-10-CM   1. Bilateral retinal lattice degeneration  H35.413   2. Retinal hole of both eyes  H33.323   3. Diabetes mellitus type 2 without retinopathy ([place])  E11.9   4. Essential hypertension  I10   5. Hypertensive retinopathy of both eyes  H35.033   6. Retinal edema  H35.81 OCT, Retina - OU - Both Eyes  7. Combined forms of age-related cataract of both eyes  H25.813     1,2. Lattice degeneration w/ atrophic holes, both eyes  - OD: lattice with atrophic holes at 1200 and from 0730-0800  - OS: mild patch of lattice at 0130, pigmented lattice with atrophic hole at 0300 and from 0530-0600  - no SRF or RD OU  - discussed findings, prognosis, and treatment options including observation  - recommend laser retinopexy OU -- OD first  - pt wishes to proceed with laser next week  - RBA of procedure discussed, questions answered  - f/u in 1-2 wks for laser retinopexy OD, sooner prn  3. Diabetes mellitus, type 2 without retinopathy  - The incidence, risk factors for progression, natural history and treatment options for diabetic retinopathy  were discussed with patient.    -  The need for close monitoring of blood glucose, blood pressure, and serum lipids, avoiding cigarette or any type of tobacco, and the need for long term follow up was also discussed with patient.  - f/u in 1 year, sooner prn  4,5. Hypertensive retinopathy OU  - discussed importance of tight BP control  -  monitor  6. No retinal edema on exam or OCT  7. Mild Mixed form age related cataract OU  - The symptoms of cataract, surgical options, and treatments and risks were discussed with patient.  - discussed diagnosis and progression  - not yet visually significant  - monitor for now    Ophthalmic Meds Ordered this visit:  Meds ordered this encounter  Medications  . prednisoLONE acetate (PRED FORTE) 1 % ophthalmic suspension    Sig: Place 1 drop into the right eye 4 (four) times daily for 7 days.    Dispense:  10 mL    Refill:  0       Return for 1-2 wks -- laser retinopexy for lattice degen.  There are no Patient Instructions on file for this visit.   This document serves as a record of services personally performed by Gardiner Sleeper, MD, PhD. It was created on their behalf by Roselee Nova, COMT. The creation of this record is the provider's dictation and/or activities during the visit.  Electronically signed by: Roselee Nova, COMT 04/03/19 10:17 PM   This document serves as a record of services personally performed by Gardiner Sleeper, MD, PhD. It was created on their behalf by Ernest Mallick, OA, an ophthalmic assistant. The creation of this record is the provider's dictation and/or activities during the visit.    Electronically signed by: Ernest Mallick, OA  10.02.2020 10:17 PM    Explained the diagnoses, plan, and follow up with the patient and they expressed understanding.  Patient expressed understanding of the importance of proper follow up care.   Gardiner Sleeper, M.D., Ph.D. Diseases & Surgery of the Retina and Vitreous [facility]  I have reviewed the above documentation for accuracy and completeness, and I agree with the above. Gardiner Sleeper, M.D., Ph.D. 04/03/19 10:17 PM    Abbreviations: M myopia (nearsighted); A astigmatism; H hyperopia (farsighted); P presbyopia; Mrx spectacle prescription;  CTL contact lenses; OD right eye; OS left eye;  OU both eyes  XT exotropia; ET esotropia; PEK punctate epithelial keratitis; PEE punctate epithelial erosions; DES dry eye syndrome; MGD meibomian gland dysfunction; ATs artificial tears; PFAT's preservative free artificial tears; Espy nuclear sclerotic cataract; PSC posterior subcapsular cataract; ERM epi-retinal membrane; PVD posterior vitreous detachment; RD retinal detachment; DM diabetes mellitus; DR diabetic retinopathy; NPDR non-proliferative diabetic retinopathy; PDR proliferative diabetic retinopathy; CSME clinically significant macular edema; DME diabetic macular edema; dbh dot blot hemorrhages; CWS cotton wool spot; POAG primary open angle glaucoma; C/D cup-to-disc ratio; HVF humphrey visual field; GVF goldmann visual field; OCT optical coherence tomography; IOP intraocular pressure; BRVO Branch retinal vein occlusion; CRVO central retinal vein occlusion; CRAO central retinal artery occlusion; BRAO branch retinal artery occlusion; RT retinal tear; SB scleral buckle; PPV pars plana vitrectomy; VH Vitreous hemorrhage; PRP panretinal laser photocoagulation; IVK intravitreal kenalog; VMT vitreomacular traction; MH Macular hole;  NVD neovascularization of the disc; NVE neovascularization elsewhere; AREDS age related eye disease study; ARMD age related macular degeneration; POAG primary open angle glaucoma; EBMD epithelial/anterior basement membrane dystrophy; ACIOL anterior chamber intraocular lens; IOL intraocular lens; PCIOL posterior chamber intraocular lens; Phaco/IOL phacoemulsification  with intraocular lens placement; [place] photorefractive keratectomy; LASIK laser assisted in situ keratomileusis; HTN hypertension; DM diabetes mellitus; COPD chronic obstructive pulmonary disease

## 2019-04-02 ENCOUNTER — Ambulatory Visit (INDEPENDENT_AMBULATORY_CARE_PROVIDER_SITE_OTHER): Payer: 59 | Admitting: Ophthalmology

## 2019-04-02 DIAGNOSIS — I10 Essential (primary) hypertension: Secondary | ICD-10-CM

## 2019-04-02 DIAGNOSIS — H3581 Retinal edema: Secondary | ICD-10-CM

## 2019-04-02 DIAGNOSIS — H35413 Lattice degeneration of retina, bilateral: Secondary | ICD-10-CM

## 2019-04-02 DIAGNOSIS — E119 Type 2 diabetes mellitus without complications: Secondary | ICD-10-CM

## 2019-04-02 DIAGNOSIS — H35033 Hypertensive retinopathy, bilateral: Secondary | ICD-10-CM

## 2019-04-02 DIAGNOSIS — H25813 Combined forms of age-related cataract, bilateral: Secondary | ICD-10-CM

## 2019-04-02 DIAGNOSIS — H33323 Round hole, bilateral: Secondary | ICD-10-CM | POA: Diagnosis not present

## 2019-04-02 MED ORDER — PREDNISOLONE ACETATE 1 % OP SUSP: 1.0000 [drp] | Freq: Four times a day (QID) | OPHTHALMIC | 0 refills | Status: AC

## 2019-04-03 ENCOUNTER — Encounter (INDEPENDENT_AMBULATORY_CARE_PROVIDER_SITE_OTHER): Payer: Self-pay | Admitting: Ophthalmology

## 2019-04-03 IMAGING — MG 2D DIGITAL DIAGNOSTIC BILATERAL MAMMOGRAM WITH CAD AND ADJUNCT T
8 of 15 series · 8 of 35 positions shown · non-contrast
Comparison: None

CLINICAL DATA: Patient presents for palpable left breast
abnormality.

EXAM:
2D DIGITAL DIAGNOSTIC BILATERAL MAMMOGRAM WITH CAD AND ADJUNCT TOMO
ULTRASOUND BILATERAL BREAST

[L MLO]
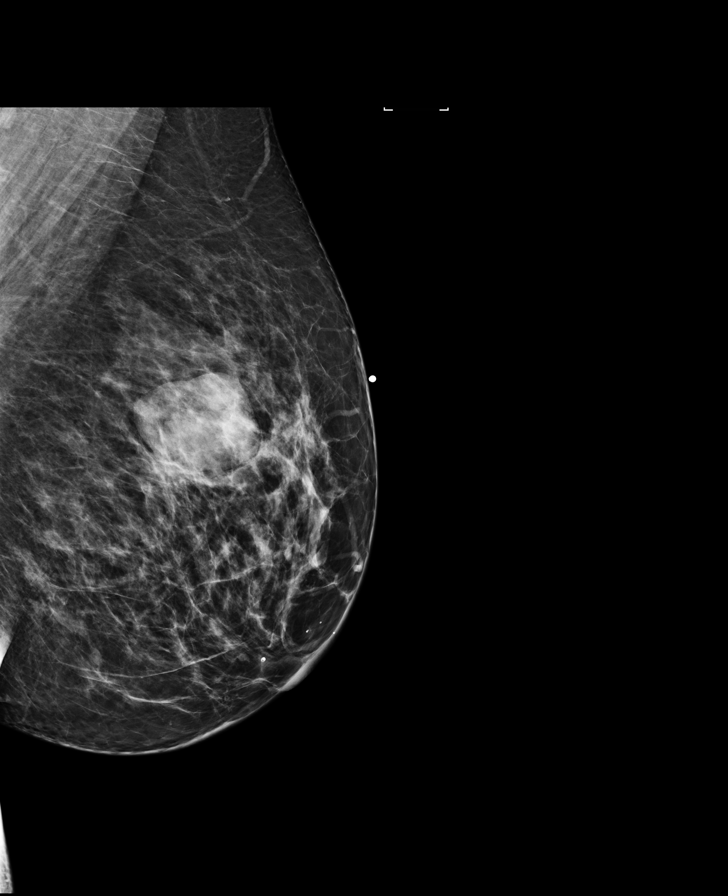

[L TAN]
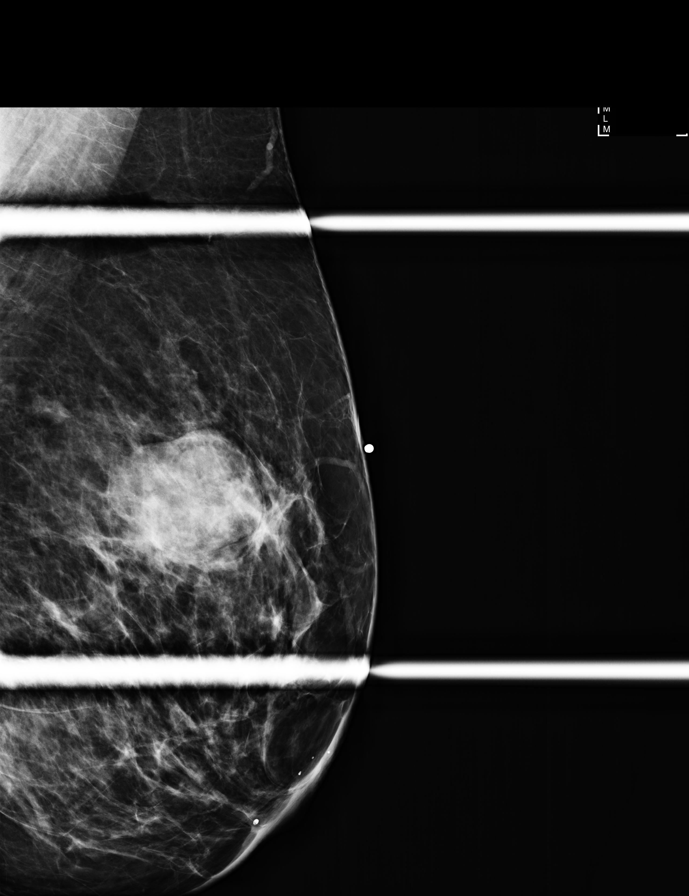

[R CC]
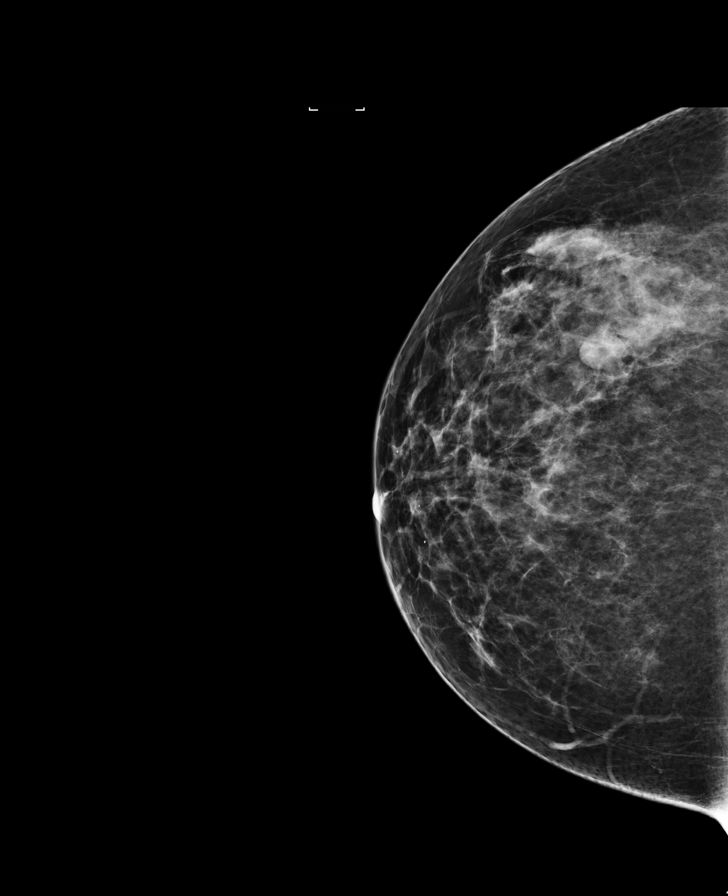

[L MLO synth-2D]
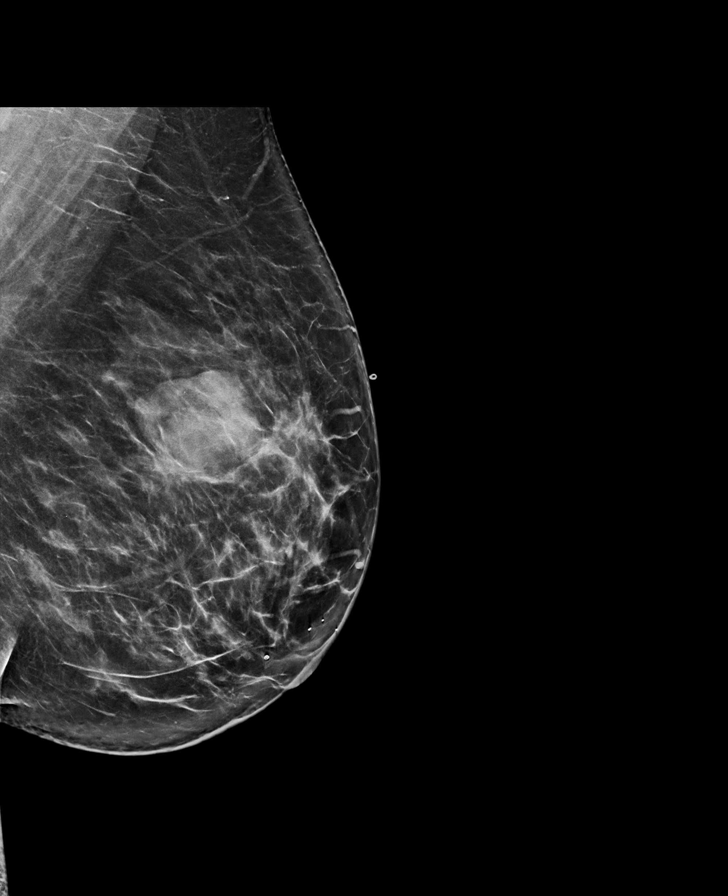

[R CC synth-2D]
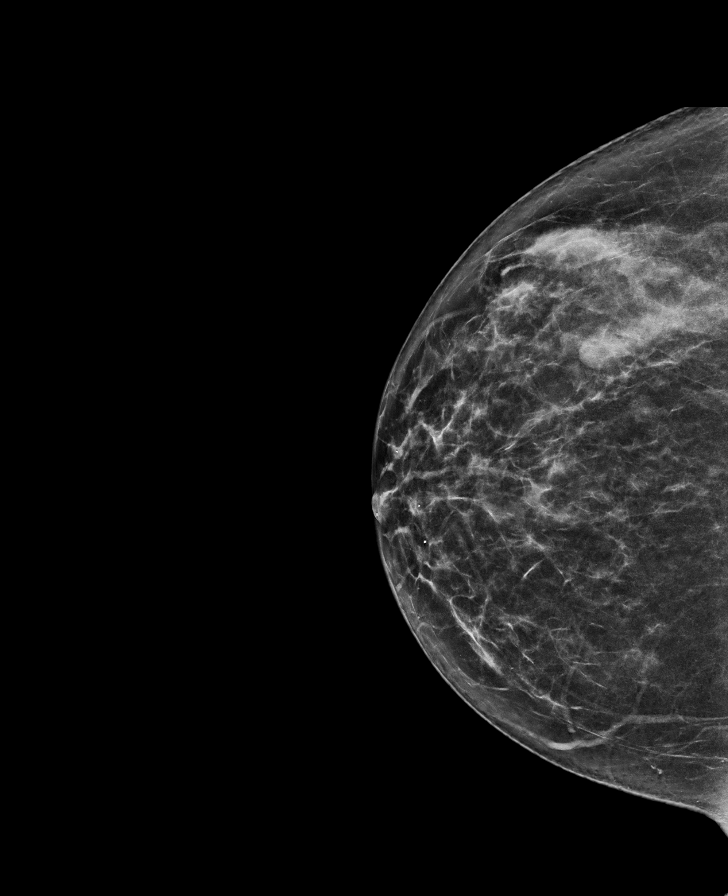

[L TAN synth-2D]
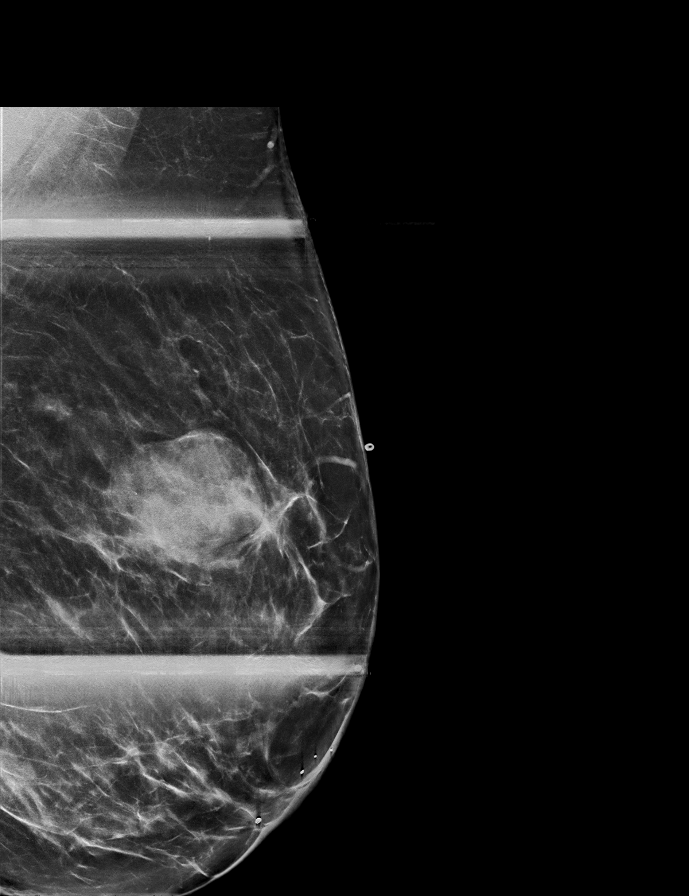

[L CC synth-2D]
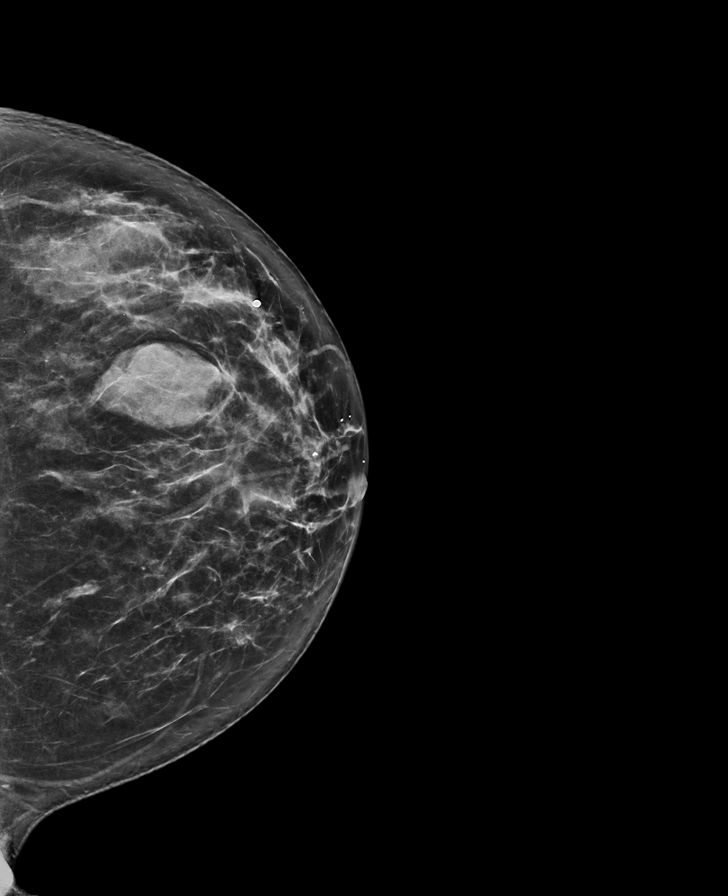

[R MLO]
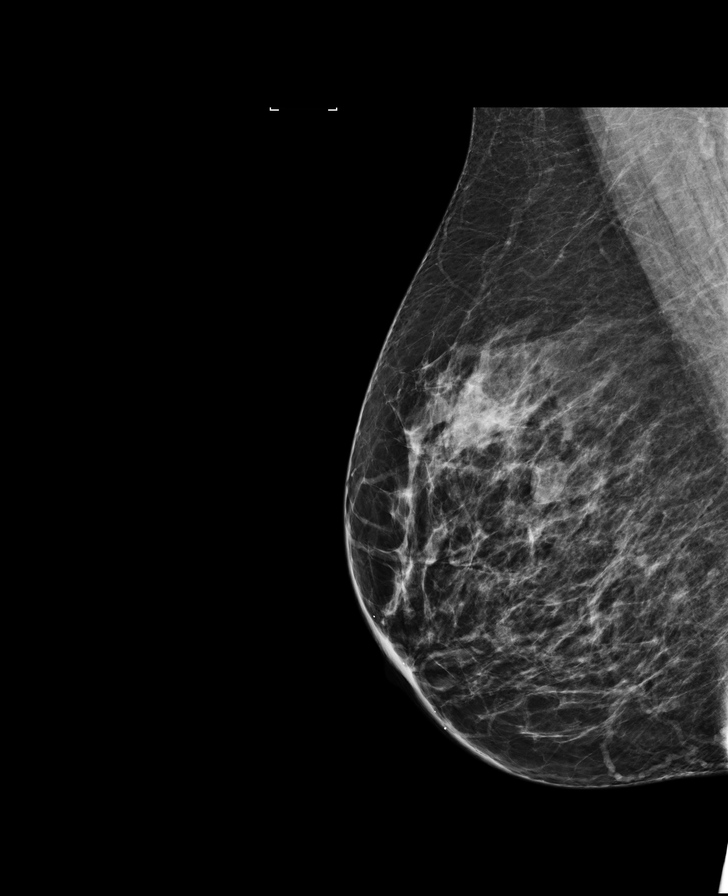

[8 of 35 positions shown; findings below may reference images not displayed]

ACR Breast Density Category c: The breast tissue is heterogeneously
dense, which may obscure small masses.
FINDINGS: Underlying the palpable marker is a large lobular mass within the
upper-outer left breast.

Within the upper inner left breast there is an additional
low-density lobular mass.

Within the upper-outer right breast middle depth there is an oval
circumscribed mass.

Mammographic images were processed with CAD.

On physical exam, there is a palpable mass within the upper-outer
left breast. No discrete mass is palpated within the upper-outer
right breast.

Targeted ultrasound is performed, showing a 3.3 x 2.5 x 3.3 cm
lobular hypoechoic mass left breast 1 o'clock position 6 cm from the
nipple, corresponding with palpable abnormality.

Within the left breast 0242 o'clock 7 cm from the nipple there is a
4 x 8 x 5 mm probable cluster of cysts.

No left axillary adenopathy.

Within the right breast 10 o'clock position 5 cm from nipple there
is a 1.2 x 0.7 x 1.3 cm oval circumscribed hypoechoic mass.

No right axillary adenopathy.
IMPRESSION: 1. Indeterminate palpable left breast mass 1 o'clock position.
2. Indeterminate right breast mass 10 o'clock position.
3. Probable cluster of cysts left breast 0242 o'clock 7 cm from the
nipple.

RECOMMENDATION:
1. Ultrasound-guided core needle biopsy palpable left breast mass 1
o'clock position.
2. Ultrasound-guided core needle biopsy right breast mass 10 o'clock
position.
3. If these demonstrate benign pathology, recommend six-month
follow-up mammogram and ultrasound to ensure stability of probably
benign left breast mass 0242 o'clock. If either of the prior
biopsies are malignant, recommend ultrasound-guided core needle
biopsy or stereotactic guided biopsy of the left breast mass 0242
o'clock.

I have discussed the findings and recommendations with the patient.
Results were also provided in writing at the conclusion of the
visit. If applicable, a reminder letter will be sent to the patient
regarding the next appointment.

BI-RADS CATEGORY  4: Suspicious.

## 2019-04-07 NOTE — Progress Notes (Signed)
[facility] Note  04/08/2019     CHIEF COMPLAINT Patient presents for Retina Follow Up   HISTORY OF PRESENT ILLNESS: Jody Duke is a 48 y.o. female who presents to the clinic today for:   HPI    Retina Follow Up    Patient presents with  Other.  In both eyes.  This started 1 week ago.  Severity is moderate.  I, the attending physician,  performed the HPI with the patient and updated documentation appropriately.          Comments    Patient here for 1 week retina follow up for lattice ou retinal hole ou. Patient states vision blurry. About the same. No eye pain.        Last edited by Bernarda Caffey, MD on 04/08/2019  8:29 AM. (History)    pt here for laser retinopexy OD today   Referring physician: Shirleen Schirmer, PA-C [address] [place],  [place] [address]  HISTORICAL INFORMATION:   Selected notes from the [facility] Patient being referred by Shirleen Schirmer, PA for retinal break OS.  Last eye exam: 09.29.20  BCVA: 20/20 OU  Medical history: DM, HTN  Last a1c: 7.0 on 09.17.20   CURRENT MEDICATIONS: Current Outpatient Medications (Ophthalmic Drugs)  Medication Sig  . prednisoLONE acetate (PRED FORTE) 1 % ophthalmic suspension Place 1 drop into the right eye 4 (four) times daily for 7 days.   No current facility-administered medications for this visit.  (Ophthalmic Drugs)   Current Outpatient Medications (Other)  Medication Sig  . atorvastatin (LIPITOR) 40 MG tablet Take 1 tablet (40 mg total) by mouth daily.  . cyclobenzaprine (FLEXERIL) 10 MG tablet Take 1 tablet (10 mg total) by mouth at bedtime as needed for muscle spasms.  . dapagliflozin propanediol (FARXIGA) 5 MG TABS tablet Take 5 mg by mouth daily.  . diclofenac (VOLTAREN) 75 MG EC tablet Take 1 tablet (75 mg total) by mouth 2 (two) times daily.  Marland Kitchen estradiol (ESTRACE) 0.5 MG tablet Take 1 tablet (0.5 mg total) by mouth daily.  Marland Kitchen gabapentin (NEURONTIN)  300 MG capsule Take 1 capsule (300 mg total) by mouth 3 (three) times daily.  Marland Kitchen losartan (COZAAR) 50 MG tablet Take 1 tablet (50 mg total) by mouth daily.  . metFORMIN (GLUCOPHAGE) 1000 MG tablet Take 1 tablet (1,000 mg total) by mouth 2 (two) times daily with a meal.  . venlafaxine XR (EFFEXOR-XR) 150 MG 24 hr capsule Take 1 capsule (150 mg total) by mouth daily with breakfast.   No current facility-administered medications for this visit.  (Other)      REVIEW OF SYSTEMS: ROS    Positive for: Endocrine, Eyes   Negative for: Constitutional, Gastrointestinal, Neurological, Skin, Genitourinary, Musculoskeletal, HENT, Cardiovascular, Respiratory, Psychiatric, Allergic/Imm, Heme/Lymph   Last edited by Theodore Demark, COA on 04/08/2019  8:16 AM. (History)       ALLERGIES Allergies  Allergen Reactions  . Percocet [Oxycodone-Acetaminophen] Nausea And Vomiting    PAST MEDICAL HISTORY Past Medical History:  Diagnosis Date  . Breast mass, left 07/2017  . Diabetes mellitus without complication ([place])   . Hyperlipidemia   . Migraines    Past Surgical History:  Procedure Laterality Date  . ANKLE SURGERY Left 10/15/2017  . BREAST BIOPSY Left 07/24/2017   Procedure: LEFT BREAST MASS EXCISIONAL BIOPSY;  Surgeon: Rolm Bookbinder, MD;  Location: [facility];  Service: General;  Laterality: Left;  .  DEBULKING N/A 10/02/2017   Procedure: DEBULKING;  Surgeon: Isabel Caprice, MD;  Location: [facility];  Service: Gynecology;  Laterality: N/A;  . FOOT GANGLION EXCISION Left    age 61  . HYSTERECTOMY ABDOMINAL WITH SALPINGO-OOPHORECTOMY Bilateral 10/02/2017   Procedure: HYSTERECTOMY ABDOMINAL WITH SALPINGO-OOPHORECTOMY;  Surgeon: Isabel Caprice, MD;  Location: [facility];  Service: Gynecology;  Laterality: Bilateral;  bilateral salpingo-oophorectomy with possible total abdominal hysterectomy  . LAPAROTOMY WITH STAGING N/A 10/02/2017   Procedure: LAPAROTOMY WITH STAGING/OMENTECTOMY;   Surgeon: Isabel Caprice, MD;  Location: [facility];  Service: Gynecology;  Laterality: N/A;  . RE-EXCISION OF BREAST LUMPECTOMY Left 08/25/2017   Procedure: LEFT RE-EXCISION OF BREAST MARGINS ERAS PATHWAY;  Surgeon: Rolm Bookbinder, MD;  Location: [facility];  Service: General;  Laterality: Left;    FAMILY HISTORY Family History  Problem Relation Age of Onset  . Hyperlipidemia Mother   . Alcohol abuse Mother   . Cirrhosis Mother   . Diabetes Father   . Hyperlipidemia Father   . Hypertension Father   . Stroke Father   . Heart disease Father 39       CAD with stenting multiple  . Hyperlipidemia Brother   . Hypertension Brother   . Diabetes Sister   . Hyperlipidemia Sister   . Hypertension Sister   . Breast cancer Maternal Grandmother        Post menopausal  . Pancreatic cancer Paternal Aunt   . Pancreatic cancer Paternal Aunt     SOCIAL HISTORY Social History   Tobacco Use  . Smoking status: Current Every Day Smoker    Packs/day: 0.50    Years: 30.00    Pack years: 15.00    Types: Cigarettes  . Smokeless tobacco: Never Used  . Tobacco comment: 1 daily  Substance Use Topics  . Alcohol use: No    Frequency: Never  . Drug use: No         OPHTHALMIC EXAM:  Base Eye Exam    Visual Acuity (Snellen - Linear)      Right Left   Dist Calcium 20/20 -1 20/25 -1   Dist ph [place]  NI       Tonometry (Tonopen, 8:12 AM)      Right Left   Pressure 13 14       Pupils      Dark Light Shape React APD   Right 3 2 Round Brisk None   Left 3 2 Round Brisk None       Visual Fields (Counting fingers)      Left Right    Full Full       Extraocular Movement      Right Left    Full, Ortho Full, Ortho       Neuro/Psych    Oriented x3: Yes   Mood/Affect: Normal       Dilation    Both eyes: 1.0% Mydriacyl, 2.5% Phenylephrine @ 8:12 AM        Slit Lamp and Fundus Exam    Slit Lamp Exam      Right Left   Lids/Lashes Dermatochalasis - upper lid  Dermatochalasis - upper lid   Conjunctiva/Sclera White and quiet Mild nasal and temporal Pinguecula   Cornea Trace Punctate epithelial erosions, Debris in tear film 3-4+Punctate epithelial erosions inferiorly, Debris in tear film   Anterior Chamber Deep and quiet Deep and quiet   Iris Round and dilated Round and dilated   Lens 1+ Nuclear sclerosis, 1+  Cortical cataract 1+ Nuclear sclerosis, 1+ Cortical cataract   Vitreous Mild Vitreous syneresis Mild Vitreous syneresis       Fundus Exam      Right Left   Disc Pink and Sharp, temporal PPP Pink and Sharp, temporal PPP   C/D Ratio 0.1 0.1   Macula Flat, Good foveal reflex, mild RPE mottling and clumping Flat, good foveal reflex, mild RPE mottling and clumping, No heme or edema   Vessels Vascular attenuation, Tortuousity Vascular attenuation, Tortuousity   Periphery Attached, lattice with atrophic holes at 1030, 1200 and from 0730-0800 Attached, mild patch of lattice at 0130, pigmented lattice with atrophic hole at 0300 and from 0530-0600          IMAGING AND PROCEDURES  Imaging and Procedures for @TODAY @  OCT, Retina - OU - Both Eyes       Right Eye Quality was good. Central Foveal Thickness: 256. Progression has been stable. Findings include normal foveal contour, no IRF, no SRF.   Left Eye Quality was good. Central Foveal Thickness: 251. Progression has been stable. Findings include normal foveal contour, no IRF, no SRF (Focal druse inferior to fovea).   Notes *Images captured and stored on drive  Diagnosis / Impression:  NFP; no IRF/SRF OS: Focal druse inferior to fovea  Clinical management:  See below  Abbreviations: NFP - Normal foveal profile. CME - cystoid macular edema. PED - pigment epithelial detachment. IRF - intraretinal fluid. SRF - subretinal fluid. EZ - ellipsoid zone. ERM - epiretinal membrane. ORA - outer retinal atrophy. ORT - outer retinal tubulation. SRHM - subretinal hyper-reflective material         Repair Retinal Breaks, Laser - OD - Right Eye       LASER PROCEDURE NOTE  Procedure:  Barrier laser retinopexy using slit lamp laser, RIGHT eye   Diagnosis:   Lattice degeneration w/ atrophic holes, RIGHT eye                     Patches of lattice: 0830, 1200 superiorly; 0730-0800 inferiorly  Surgeon: Bernarda Caffey, MD, PhD  Anesthesia: Topical  Informed consent obtained, operative eye marked, and time out performed prior to initiation of laser.   Laser settings:  Lumenis Smart532 laser, slit lamp Lens: Mainster PRP 165 Power: 280 mW Spot size: 200 microns Duration: 30 msec  # spots: 673  Placement of laser: Using a Mainster PRP 165 contact lens at the slit lamp, laser was placed in three confluent rows around patches of lattice w/ atrophic holes at 1030, 1200 and 0730-0800 oclock anterior to equator with additional rows anteriorly.  Complications: None.  Patient tolerated the procedure well and received written and verbal post-procedure care information/education.                 ASSESSMENT/PLAN:    ICD-10-CM   1. Bilateral retinal lattice degeneration  H35.413 Repair Retinal Breaks, Laser - OD - Right Eye  2. Retinal hole of both eyes  H33.323 Repair Retinal Breaks, Laser - OD - Right Eye  3. Diabetes mellitus type 2 without retinopathy ([place])  E11.9   4. Essential hypertension  I10   5. Hypertensive retinopathy of both eyes  H35.033   6. Retinal edema  H35.81 OCT, Retina - OU - Both Eyes  7. Combined forms of age-related cataract of both eyes  H25.813     1,2. Lattice degeneration w/ atrophic holes, both eyes  - OD: lattice with atrophic holes at 1030, 1200 and  from 0730-0800  - OS: mild patch of lattice at 0130, pigmented lattice with atrophic hole at 0300 and from 0530-0600  - no SRF or RD OU  - discussed findings, prognosis, and treatment options including observation  - recommend laser retinopexy OD, today (10.08.20)  - pt wishes to proceed  - RBA of  procedure discussed, questions answered  - start PF qid OD x7 days  - f/u in 2-3 wks for POV OD, laser retinopexy OS, sooner prn  3. Diabetes mellitus, type 2 without retinopathy  - The incidence, risk factors for progression, natural history and treatment options for diabetic retinopathy  were discussed with patient.    - The need for close monitoring of blood glucose, blood pressure, and serum lipids, avoiding cigarette or any type of tobacco, and the need for long term follow up was also discussed with patient.  - f/u in 1 year, sooner prn  4,5. Hypertensive retinopathy OU  - discussed importance of tight BP control  - monitor  6. No retinal edema on exam or OCT  7. Mild Mixed form age related cataract OU  - The symptoms of cataract, surgical options, and treatments and risks were discussed with patient.  - discussed diagnosis and progression  - not yet visually significant  - monitor for now    Ophthalmic Meds Ordered this visit:  No orders of the defined types were placed in this encounter.      Return for f/u 2-3 weeks, POV OD, Laser OS.  There are no Patient Instructions on file for this visit.   This document serves as a record of services personally performed by Gardiner Sleeper, MD, PhD. It was created on their behalf by Roselee Nova, COMT. The creation of this record is the provider's dictation and/or activities during the visit.  Electronically signed by: Roselee Nova, COMT 04/08/19 9:11 AM   This document serves as a record of services personally performed by Gardiner Sleeper, MD, PhD. It was created on their behalf by Ernest Mallick, OA, an ophthalmic assistant. The creation of this record is the provider's dictation and/or activities during the visit.    Electronically signed by: Ernest Mallick, OA 10.08.2020 9:11 AM    Explained the diagnoses, plan, and follow up with the patient and they expressed understanding.  Patient expressed understanding of the importance  of proper follow up care.   Gardiner Sleeper, M.D., Ph.D. Diseases & Surgery of the Retina and Vitreous [facility]  I have reviewed the above documentation for accuracy and completeness, and I agree with the above. Gardiner Sleeper, M.D., Ph.D. 04/08/19 9:11 AM     Abbreviations: M myopia (nearsighted); A astigmatism; H hyperopia (farsighted); P presbyopia; Mrx spectacle prescription;  CTL contact lenses; OD right eye; OS left eye; OU both eyes  XT exotropia; ET esotropia; PEK punctate epithelial keratitis; PEE punctate epithelial erosions; DES dry eye syndrome; MGD meibomian gland dysfunction; ATs artificial tears; PFAT's preservative free artificial tears; [place] nuclear sclerotic cataract; PSC posterior subcapsular cataract; ERM epi-retinal membrane; PVD posterior vitreous detachment; RD retinal detachment; DM diabetes mellitus; DR diabetic retinopathy; NPDR non-proliferative diabetic retinopathy; PDR proliferative diabetic retinopathy; CSME clinically significant macular edema; DME diabetic macular edema; dbh dot blot hemorrhages; CWS cotton wool spot; POAG primary open angle glaucoma; C/D cup-to-disc ratio; HVF humphrey visual field; GVF goldmann visual field; OCT optical coherence tomography; IOP intraocular pressure; BRVO Branch retinal vein occlusion; CRVO central retinal vein occlusion; CRAO central retinal artery  occlusion; BRAO branch retinal artery occlusion; RT retinal tear; SB scleral buckle; PPV pars plana vitrectomy; VH Vitreous hemorrhage; PRP panretinal laser photocoagulation; IVK intravitreal kenalog; VMT vitreomacular traction; MH Macular hole;  NVD neovascularization of the disc; NVE neovascularization elsewhere; AREDS age related eye disease study; ARMD age related macular degeneration; POAG primary open angle glaucoma; EBMD epithelial/anterior basement membrane dystrophy; ACIOL anterior chamber intraocular lens; IOL intraocular lens; PCIOL posterior chamber  intraocular lens; Phaco/IOL phacoemulsification with intraocular lens placement; [place] photorefractive keratectomy; LASIK laser assisted in situ keratomileusis; HTN hypertension; DM diabetes mellitus; COPD chronic obstructive pulmonary disease

## 2019-04-08 ENCOUNTER — Ambulatory Visit (INDEPENDENT_AMBULATORY_CARE_PROVIDER_SITE_OTHER): Payer: 59 | Admitting: Ophthalmology

## 2019-04-08 ENCOUNTER — Encounter (INDEPENDENT_AMBULATORY_CARE_PROVIDER_SITE_OTHER): Payer: Self-pay | Admitting: Ophthalmology

## 2019-04-08 DIAGNOSIS — H25813 Combined forms of age-related cataract, bilateral: Secondary | ICD-10-CM

## 2019-04-08 DIAGNOSIS — I10 Essential (primary) hypertension: Secondary | ICD-10-CM | POA: Diagnosis not present

## 2019-04-08 DIAGNOSIS — H35413 Lattice degeneration of retina, bilateral: Secondary | ICD-10-CM | POA: Diagnosis not present

## 2019-04-08 DIAGNOSIS — E119 Type 2 diabetes mellitus without complications: Secondary | ICD-10-CM

## 2019-04-08 DIAGNOSIS — H33323 Round hole, bilateral: Secondary | ICD-10-CM | POA: Diagnosis not present

## 2019-04-08 DIAGNOSIS — H35033 Hypertensive retinopathy, bilateral: Secondary | ICD-10-CM

## 2019-04-08 DIAGNOSIS — H3581 Retinal edema: Secondary | ICD-10-CM | POA: Diagnosis not present

## 2019-04-12 ENCOUNTER — Other Ambulatory Visit: Payer: Self-pay

## 2019-04-12 ENCOUNTER — Ambulatory Visit: Payer: 59 | Admitting: Physical Therapy

## 2019-04-12 DIAGNOSIS — R262 Difficulty in walking, not elsewhere classified: Secondary | ICD-10-CM

## 2019-04-12 DIAGNOSIS — M545 Low back pain, unspecified: Secondary | ICD-10-CM

## 2019-04-12 DIAGNOSIS — M25572 Pain in left ankle and joints of left foot: Secondary | ICD-10-CM

## 2019-04-12 DIAGNOSIS — R600 Localized edema: Secondary | ICD-10-CM

## 2019-04-12 DIAGNOSIS — M542 Cervicalgia: Secondary | ICD-10-CM | POA: Diagnosis not present

## 2019-04-12 DIAGNOSIS — G8929 Other chronic pain: Secondary | ICD-10-CM

## 2019-04-12 NOTE — Therapy (Addendum)
[place], [place], [address] Phone: [phone]   Fax:  [phone]  Physical Therapy Treatment/Discharge addendum PHYSICAL THERAPY DISCHARGE SUMMARY  Visits from Start of Care: 8  Current functional level related to goals / functional outcomes: See below   Remaining deficits: See below   Education / Equipment: HEP Plan: Patient agrees to discharge.  Patient goals were partially met. Patient is being discharged due to not returning since the last visit.  ?????     Elsie Ra, PT, DPT 06/11/19 9:15 AM    Patient Details  Name: Jody Duke MRN: [id number] Date of Birth: 10/03/1970 Referring Provider (PT): Dene Gentry, MD   Encounter Date: 04/12/2019  PT End of Session - 04/12/19 1703    Visit Number  8    Number of Visits  12    Date for PT Re-Evaluation  04/15/19    Authorization Type  [employer]    PT Start Time  1103   18 min late for her appointment   PT Stop Time  1130    PT Time Calculation (min)  27 min    Activity Tolerance  Patient tolerated treatment well    Behavior During Therapy  [facility] for tasks assessed/performed       Past Medical History:  Diagnosis Date  . Breast mass, left 07/2017  . Diabetes mellitus without complication ([place])   . Hyperlipidemia   . Migraines     Past Surgical History:  Procedure Laterality Date  . ANKLE SURGERY Left 10/15/2017  . BREAST BIOPSY Left 07/24/2017   Procedure: LEFT BREAST MASS EXCISIONAL BIOPSY;  Surgeon: Rolm Bookbinder, MD;  Location: [facility];  Service: General;  Laterality: Left;  . DEBULKING N/A 10/02/2017   Procedure: DEBULKING;  Surgeon: Isabel Caprice, MD;  [facility];  Service: Gynecology;  Laterality: N/A;  . FOOT GANGLION EXCISION Left    age 48  . HYSTERECTOMY ABDOMINAL WITH SALPINGO-OOPHORECTOMY Bilateral 10/02/2017   Procedure: HYSTERECTOMY ABDOMINAL WITH SALPINGO-OOPHORECTOMY;  Surgeon: Isabel Caprice, MD;  Location[facility];  Service: Gynecology;  Laterality: Bilateral;  bilateral salpingo-oophorectomy with possible total abdominal hysterectomy  . LAPAROTOMY WITH STAGING N/A 10/02/2017   Procedure: LAPAROTOMY WITH STAGING/OMENTECTOMY;  Surgeon: Isabel Caprice, MD;  [facility];  Service: Gynecology;  Laterality: N/A;  . RE-EXCISION OF BREAST LUMPECTOMY Left 08/25/2017   Procedure: LEFT RE-EXCISION OF BREAST MARGINS ERAS PATHWAY;  Surgeon: Rolm Bookbinder, MD;  Location: [facility];  Service: General;  Laterality: Left;    There were no vitals filed for this visit.  Subjective Assessment - 04/12/19 1651    Subjective  7/10 neck and Rt arm pain with tingling into all of her fingers.    Pertinent History  Patient had an injection following her MRI which improved her symptoms by 60%    Limitations  Sitting;Lifting;Standing;Walking    How long can you sit comfortably?  10 min    How long can you stand comfortably?  hour    How long can you walk comfortably?  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 "  No recent imaging for her back    Patient Stated Goals  ease the pain    Pain Onset  More than a month ago    Pain Onset  More than a month ago                       [facility] Adult PT Treatment/Exercise - 04/12/19 0001      Exercises   Other Exercises   performed supine chin tucks  X20, seated cervical ext SNAG X 15, seated cerv rotation SNAG X 15, seated thoracic extension  x15      Manual Therapy   Manual therapy comments  Rt shoulder PROM and GH mobs, Attempted cervical mobs in prone but pt very TTP and unable to tolerate much pressure and this caused pain down her Rt arm                  PT Long Term Goals - 03/16/19 1105      PT LONG TERM GOAL #1   Title  Pt will be I and compliant  with HEP. (Target goal for all goals 6 weeks 04/15/19)    Baseline  just added HEP for Lt ankle today    Status  On-going      PT LONG TERM GOAL #2   Title  Pt will improve lumbar and neck ROM and Lt ankle ROM to [facility] to improve mobility    Baseline  revised to add in Lt ankle    Status  Revised      PT LONG TERM GOAL #3   Title  Pt will relay less often and more managed radicular symptoms.    Baseline  does relay this is improving some    Status  On-going      PT LONG TERM GOAL #4   Title  Pt will improve UE/LE strength to at least 5-/5 MMT to improve function    Status  On-going      PT LONG TERM GOAL #5   Title  Pt will reduce pain by overall 50%    Status  On-going            Plan - 04/12/19 1704    Clinical Impression Statement  She was having more radiculopathy into her Rt arm and hand so session focused on neck and shoulder today. Radiculopathy appears to be coming from neck as it was provoked with Cervical mobilizations. She may benefit from NCV test or surgical consult in does not improve with a couple more weeks of PT.    Personal Factors and Comorbidities  Comorbidity 1;Comorbidity 2;Past/Current Experience    Comorbidities  WNI:[id number] disc extrusion, DM, migranes, 2 breast surgeries, Lt ankle surgery    Examination-Participation Restrictions  Driving;Community Activity;Meal Prep;Laundry;Shop    Stability/Clinical Decision Making  Evolving/Moderate complexity    Rehab Potential  Fair    PT Frequency  2x / week    PT Duration  6 weeks    PT Treatment/Interventions  Aquatic Therapy;Cryotherapy;Electrical Stimulation;Iontophoresis 75m/ml Dexamethasone;Moist Heat;Traction;Ultrasound;Therapeutic activities;Therapeutic exercise;Neuromuscular re-education;Manual techniques;Passive range of motion;Dry needling;Spinal Manipulations;Joint Manipulations    PT Next Visit Plan  how was nerve glides,  needs pain science education, and gentle exercise to start    PT Home Exercise  Plan  Access Code: [email or web address] neck and back, then added Lt ankle 4way with tband, gastroc stretch, foot extensor stretch, tandem balance, added radial and medial n glides    Consulted and Agree with Plan of Care  Patient       Patient will benefit from skilled therapeutic intervention in order to improve the following deficits and impairments:  Decreased endurance, Decreased range of motion, Decreased strength,  Difficulty walking, Impaired flexibility, Increased muscle spasms, Increased fascial restricitons, Pain  Visit Diagnosis: Cervicalgia  Chronic bilateral low back pain without sciatica  Difficulty in walking, not elsewhere classified  Pain in left ankle and joints of left foot  Localized edema     Problem List Patient Active Problem List   Diagnosis Date Noted  . Hot flashes due to surgical menopause 12/28/2017  . Pure hypercholesterolemia 07/30/2017  . Type 2 diabetes mellitus without complication, without long-term current use of insulin ([place]) 07/30/2017  . Hematuria, microscopic 07/30/2017  . Menometrorrhagia 07/30/2017  . Ankle mass, left 07/30/2017  . Blood pressure elevated without history of HTN 07/30/2017  . Tobacco abuse 07/30/2017  . Breast mass, right 07/30/2017  . Class 1 obesity due to excess calories with serious comorbidity and body mass index (BMI) of 33.0 to 33.9 in adult 07/30/2017    Silvestre Mesi 04/12/2019, 5:07 PM  [facility] [address] [place], [place], [address] Phone: [phone]   Fax:  [phone]  Name: Jody Duke MRN: [id number] Date of Birth: 1971/06/26

## 2019-04-14 ENCOUNTER — Ambulatory Visit: Payer: 59 | Admitting: Physical Therapy

## 2019-04-14 NOTE — Telephone Encounter (Signed)
Please set her up to see neurosurgery for right cervical radiculopathy and also set up a repeat ESI for this.  Thanks!

## 2019-04-15 ENCOUNTER — Other Ambulatory Visit: Payer: Self-pay

## 2019-04-15 DIAGNOSIS — M5010 Cervical disc disorder with radiculopathy, unspecified cervical region: Secondary | ICD-10-CM

## 2019-04-20 ENCOUNTER — Other Ambulatory Visit: Payer: Self-pay | Admitting: Family Medicine

## 2019-04-24 ENCOUNTER — Other Ambulatory Visit: Payer: Self-pay | Admitting: Family Medicine

## 2019-04-29 ENCOUNTER — Encounter (INDEPENDENT_AMBULATORY_CARE_PROVIDER_SITE_OTHER): Payer: 59 | Admitting: Ophthalmology

## 2019-05-02 ENCOUNTER — Other Ambulatory Visit: Payer: Self-pay | Admitting: Family Medicine

## 2019-05-11 ENCOUNTER — Other Ambulatory Visit: Payer: Self-pay | Admitting: *Deleted

## 2019-05-11 DIAGNOSIS — M5010 Cervical disc disorder with radiculopathy, unspecified cervical region: Secondary | ICD-10-CM

## 2019-05-12 ENCOUNTER — Encounter (INDEPENDENT_AMBULATORY_CARE_PROVIDER_SITE_OTHER): Payer: 59 | Admitting: Ophthalmology

## 2019-05-12 ENCOUNTER — Other Ambulatory Visit: Payer: Self-pay | Admitting: Family Medicine

## 2019-05-12 DIAGNOSIS — M5010 Cervical disc disorder with radiculopathy, unspecified cervical region: Secondary | ICD-10-CM

## 2019-05-19 MED ORDER — CYCLOBENZAPRINE HCL 10 MG PO TABS: 10.0000 mg | ORAL_TABLET | Freq: Every evening | ORAL | 1 refills | Status: DC | PRN

## 2019-05-19 NOTE — Addendum Note (Signed)
Addended by: Dene Gentry on: 05/19/2019 08:26 AM   Modules accepted: Orders

## 2019-05-21 ENCOUNTER — Other Ambulatory Visit: Payer: Self-pay | Admitting: Family Medicine

## 2019-05-21 ENCOUNTER — Other Ambulatory Visit: Payer: Self-pay

## 2019-05-21 ENCOUNTER — Ambulatory Visit
Admission: RE | Admit: 2019-05-21 | Discharge: 2019-05-21 | Disposition: A | Discharge status: Home / Self Care | Payer: 59 | Source: Ambulatory Visit | Attending: Family Medicine | Admitting: Family Medicine

## 2019-05-21 DIAGNOSIS — M5010 Cervical disc disorder with radiculopathy, unspecified cervical region: Secondary | ICD-10-CM

## 2019-05-21 MED ORDER — IOPAMIDOL (ISOVUE-M 300) INJECTION 61%
1.0000 mL | Freq: Once | INTRAMUSCULAR | Status: AC | PRN
  Administered 2019-05-21: 1 mL via EPIDURAL

## 2019-05-21 MED ORDER — TRIAMCINOLONE ACETONIDE 40 MG/ML IJ SUSP (RADIOLOGY)
60.0000 mg | Freq: Once | INTRAMUSCULAR | Status: AC
  Administered 2019-05-21: 60 mg via EPIDURAL

## 2019-05-21 NOTE — Discharge Instructions (Signed)

## 2019-05-21 NOTE — Telephone Encounter (Signed)
Forwarding medication refill request to the clinical pool for review. 

## 2019-05-24 ENCOUNTER — Other Ambulatory Visit: Payer: Self-pay | Admitting: *Deleted

## 2019-05-24 MED ORDER — ESTRADIOL 0.5 MG PO TABS: 0.5000 mg | ORAL_TABLET | Freq: Every day | ORAL | 0 refills | Status: DC

## 2019-05-24 NOTE — Telephone Encounter (Signed)
Annual scheduled on 06/21/19

## 2019-05-31 ENCOUNTER — Encounter (INDEPENDENT_AMBULATORY_CARE_PROVIDER_SITE_OTHER): Payer: Self-pay | Admitting: Ophthalmology

## 2019-06-07 ENCOUNTER — Encounter (INDEPENDENT_AMBULATORY_CARE_PROVIDER_SITE_OTHER): Payer: 59 | Admitting: Ophthalmology

## 2019-06-14 ENCOUNTER — Encounter (INDEPENDENT_AMBULATORY_CARE_PROVIDER_SITE_OTHER): Payer: Self-pay | Admitting: Ophthalmology

## 2019-06-14 DIAGNOSIS — H25813 Combined forms of age-related cataract, bilateral: Secondary | ICD-10-CM

## 2019-06-14 DIAGNOSIS — H35033 Hypertensive retinopathy, bilateral: Secondary | ICD-10-CM

## 2019-06-14 DIAGNOSIS — H3581 Retinal edema: Secondary | ICD-10-CM

## 2019-06-14 DIAGNOSIS — H35413 Lattice degeneration of retina, bilateral: Secondary | ICD-10-CM

## 2019-06-14 DIAGNOSIS — H33323 Round hole, bilateral: Secondary | ICD-10-CM

## 2019-06-14 DIAGNOSIS — I10 Essential (primary) hypertension: Secondary | ICD-10-CM

## 2019-06-14 DIAGNOSIS — E119 Type 2 diabetes mellitus without complications: Secondary | ICD-10-CM

## 2019-06-17 ENCOUNTER — Ambulatory Visit: Payer: 59 | Admitting: Family Medicine

## 2019-06-17 ENCOUNTER — Encounter: Payer: Self-pay | Admitting: Family Medicine

## 2019-06-18 ENCOUNTER — Other Ambulatory Visit: Payer: Self-pay

## 2019-06-18 ENCOUNTER — Encounter: Payer: Self-pay | Admitting: Family Medicine

## 2019-06-21 ENCOUNTER — Encounter: Payer: Self-pay | Admitting: Family Medicine

## 2019-06-21 ENCOUNTER — Ambulatory Visit (INDEPENDENT_AMBULATORY_CARE_PROVIDER_SITE_OTHER): Payer: 59 | Admitting: Family Medicine

## 2019-06-21 ENCOUNTER — Ambulatory Visit (INDEPENDENT_AMBULATORY_CARE_PROVIDER_SITE_OTHER): Payer: 59 | Admitting: Gynecology

## 2019-06-21 ENCOUNTER — Other Ambulatory Visit: Payer: Self-pay

## 2019-06-21 ENCOUNTER — Encounter: Payer: Self-pay | Admitting: Gynecology

## 2019-06-21 VITALS — BP 136/89 | HR 88 | Temp 98.0°F | Ht 62.0 in | Wt 205.0 lb

## 2019-06-21 VITALS — BP 130/84 | Ht 62.0 in | Wt 205.0 lb

## 2019-06-21 DIAGNOSIS — E1129 Type 2 diabetes mellitus with other diabetic kidney complication: Secondary | ICD-10-CM

## 2019-06-21 DIAGNOSIS — E8941 Symptomatic postprocedural ovarian failure: Secondary | ICD-10-CM

## 2019-06-21 DIAGNOSIS — Z79890 Hormone replacement therapy (postmenopausal): Secondary | ICD-10-CM

## 2019-06-21 DIAGNOSIS — R809 Proteinuria, unspecified: Secondary | ICD-10-CM

## 2019-06-21 DIAGNOSIS — I10 Essential (primary) hypertension: Secondary | ICD-10-CM

## 2019-06-21 DIAGNOSIS — Z01419 Encounter for gynecological examination (general) (routine) without abnormal findings: Secondary | ICD-10-CM | POA: Diagnosis not present

## 2019-06-21 HISTORY — DX: Essential (primary) hypertension: I10

## 2019-06-21 MED ORDER — ESTRADIOL 1 MG PO TABS: 1.0000 mg | ORAL_TABLET | Freq: Every day | ORAL | 4 refills | Status: DC

## 2019-06-21 NOTE — Progress Notes (Signed)
12/21/20204:24 PM  Jody Duke 08-17-1970, 48 y.o., female [id number]  Chief Complaint  Patient presents with  . Diabetes  . Hypertension    dealing with high bp, monitors bp at home with arm cuff.     HPI:   Patient is a 48 y.o. 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, HLP, post-menopausal hot flashes on HRT who presents today for routine followup  Last OV Sept 2020 Increased effexor for mood/irritability - did not help Saw obgyn earlier today - increased to estradiol 1mg   Had a day of very elevated BP with vertigo for a day several weeks ago, has now completely resolved  Having elevated glucose, 160-180s Has seen renal - no changes    Lab Results  Component Value Date   HGBA1C 7.0 (A) 03/18/2019   HGBA1C 7.0 (A) 12/16/2018   HGBA1C 6.1 (A) 07/09/2018   Lab Results  Component Value Date   LDLCALC 72 03/18/2019   CREATININE 0.63 03/18/2019    Depression screen PHQ 2/9 03/18/2019 10/26/2018 10/08/2018  Decreased Interest 0 0 0  Down, Depressed, Hopeless 0 0 0  PHQ - 2 Score 0 0 0    Fall Risk  03/18/2019 10/26/2018 10/08/2018 07/09/2018 04/02/2018  Falls in the past year? 0 0 0 0 No  Number falls in past yr: 0 0 0 - -  Injury with Fall? 0 0 0 - -  Follow up - - Falls evaluation completed - -     Allergies  Allergen Reactions  . Percocet [Oxycodone-Acetaminophen] Nausea And Vomiting    Prior to Admission medications   Medication Sig Start Date End Date Taking? Authorizing Provider  atorvastatin (LIPITOR) 40 MG tablet Take 1 tablet (40 mg total) by mouth daily. 12/18/18   Rutherford Guys, MD  cyclobenzaprine (FLEXERIL) 10 MG tablet Take 1 tablet (10 mg total) by mouth at bedtime as needed for muscle spasms. 05/19/19   Hudnall, Sharyn Lull, MD  diclofenac (VOLTAREN) 75 MG EC tablet TAKE 1 TABLET BY MOUTH TWICE A DAY 05/24/19   Hudnall, Sharyn Lull, MD  estradiol (ESTRACE) 1 MG tablet Take 1 tablet (1 mg  total) by mouth daily. 06/21/19   Fontaine, Belinda Block, MD  FARXIGA 5 MG TABS tablet TAKE 1 TABLET BY MOUTH EVERY DAY 05/03/19   Rutherford Guys, MD  gabapentin (NEURONTIN) 300 MG capsule Take 1 capsule (300 mg total) by mouth 3 (three) times daily. 03/18/19   Rutherford Guys, MD  losartan (COZAAR) 50 MG tablet TAKE 1 TABLET BY MOUTH EVERY DAY 04/20/19   Rutherford Guys, MD  metFORMIN (GLUCOPHAGE) 1000 MG tablet TAKE 1 TABLET (1,000 MG TOTAL) BY MOUTH 2 (TWO) TIMES DAILY WITH A MEAL. 04/25/19   Rutherford Guys, MD  venlafaxine XR (EFFEXOR-XR) 150 MG 24 hr capsule Take 1 capsule (150 mg total) by mouth daily with breakfast. 03/18/19   Rutherford Guys, MD    Past Medical History:  Diagnosis Date  . Breast mass, left 07/2017  . Diabetes mellitus without complication ([place])   . Hyperlipidemia   . Migraines     Past Surgical History:  Procedure Laterality Date  . ANKLE SURGERY Left 10/15/2017  . BREAST BIOPSY Left 07/24/2017   Procedure: LEFT BREAST MASS EXCISIONAL BIOPSY;  Surgeon: Rolm Bookbinder, MD;  Location: [facility];  Service: General;  Laterality: Left;  . DEBULKING N/A 10/02/2017   Procedure: DEBULKING;  Surgeon: Isabel Caprice, MD;  Location:  [facility];  Service: Gynecology;  Laterality: N/A;  . FOOT GANGLION EXCISION Left    age 60  . HYSTERECTOMY ABDOMINAL WITH SALPINGO-OOPHORECTOMY Bilateral 10/02/2017   Procedure: HYSTERECTOMY ABDOMINAL WITH SALPINGO-OOPHORECTOMY;  Surgeon: Isabel Caprice, MD;  Location: [facility];  Service: Gynecology;  Laterality: Bilateral;  bilateral salpingo-oophorectomy with possible total abdominal hysterectomy  . LAPAROTOMY WITH STAGING N/A 10/02/2017   Procedure: LAPAROTOMY WITH STAGING/OMENTECTOMY;  Surgeon: Isabel Caprice, MD;  Location: [facility];  Service: Gynecology;  Laterality: N/A;  . RE-EXCISION OF BREAST LUMPECTOMY Left 08/25/2017   Procedure: LEFT RE-EXCISION OF BREAST MARGINS ERAS PATHWAY;  Surgeon: Rolm Bookbinder, MD;   Location: [facility];  Service: General;  Laterality: Left;    Social History   Tobacco Use  . Smoking status: Current Every Day Smoker    Packs/day: 0.50    Years: 30.00    Pack years: 15.00    Types: Cigarettes  . Smokeless tobacco: Never Used  . Tobacco comment: 1 daily  Substance Use Topics  . Alcohol use: No    Family History  Problem Relation Age of Onset  . Hyperlipidemia Mother   . Alcohol abuse Mother   . Cirrhosis Mother   . Diabetes Father   . Hyperlipidemia Father   . Hypertension Father   . Stroke Father   . Heart disease Father 9       CAD with stenting multiple  . Hyperlipidemia Brother   . Hypertension Brother   . Diabetes Sister   . Hyperlipidemia Sister   . Hypertension Sister   . Cancer Sister        Stomach  . Diabetes Sister   . Hyperlipidemia Sister   . Hypertension Sister   . Breast cancer Maternal Grandmother        Post menopausal  . Pancreatic cancer Paternal Aunt   . Pancreatic cancer Paternal Aunt     Review of Systems  Constitutional: Negative for chills and fever.  Respiratory: Negative for cough and shortness of breath.   Cardiovascular: Negative for chest pain, palpitations and leg swelling.  Gastrointestinal: Negative for abdominal pain, nausea and vomiting.   Per hpi  OBJECTIVE:  Today's Vitals   06/21/19 1616  BP: 136/89  Pulse: 88  Temp: 98 F (36.7 C)  SpO2: 97%  Weight: 205 lb (93 kg)  Height: 5\' 2"  (1.575 m)   Body mass index is 37.49 kg/m.  Wt Readings from Last 3 Encounters:  06/21/19 205 lb (93 kg)  06/21/19 205 lb (93 kg)  03/18/19 208 lb (94.3 kg)    Physical Exam Vitals and nursing note reviewed.  Constitutional:      Appearance: She is well-developed.  HENT:     Head: Normocephalic and atraumatic.     Mouth/Throat:     Pharynx: No oropharyngeal exudate.  Eyes:     General: No scleral icterus.    Conjunctiva/sclera: Conjunctivae normal.     Pupils: Pupils are equal, round,  and reactive to light.  Cardiovascular:     Rate and Rhythm: Normal rate and regular rhythm.     Heart sounds: Normal heart sounds. No murmur. No friction rub. No gallop.   Pulmonary:     Effort: Pulmonary effort is normal.     Breath sounds: Normal breath sounds. No wheezing or rales.  Musculoskeletal:     Cervical back: Neck supple.  Skin:    General: Skin is warm and dry.  Neurological:     Mental Status: She  is alert and oriented to person, place, and time.     No results found for this or any previous visit (from the past 24 hour(s)).  No results found.   ASSESSMENT and PLAN  1. Type 2 diabetes mellitus with microalbuminuria, without long-term current use of insulin ([place]) Checking labs today, medications will be adjusted as needed. (incrase farxiga) - Hemoglobin A1c - Comprehensive metabolic panel - TSH  2. Hot flashes due to surgical menopause Recent increase in estradiol. Managed by obgyn  3. Essential hypertension Controlled. Continue current regime.   Return in about 3 months (around 09/19/2019).    Rutherford Guys, MD [facility] [address], [place] [address] Ph.  [phone] Fax [phone]

## 2019-06-21 NOTE — Progress Notes (Signed)
    EMELYN NAKANISHI 01-18-1971 [id number]        48 y.o.  G0P0000 for annual gynecologic exam.  Was started on ERT last year at 0.5 mg estradiol.  Still notices significant hot flushes and sweats.  Past medical history,surgical history, problem list, medications, allergies, family history and social history were all reviewed and documented as reviewed in the EPIC chart.  ROS:  Performed with pertinent positives and negatives included in the history, assessment and plan.   Additional significant findings : None   Exam: Caryn Bee assistant Vitals:   06/21/19 0821  BP: 130/84  Weight: 205 lb (93 kg)  Height: 5\' 2"  (1.575 m)   Body mass index is 37.49 kg/m.  General appearance:  Normal affect, orientation and appearance. Skin: Grossly normal HEENT: Without gross lesions.  No cervical or supraclavicular adenopathy. Thyroid normal.  Lungs:  Clear without wheezing, rales or rhonchi Cardiac: RR, without RMG Abdominal:  Soft, nontender, without masses, guarding, rebound, organomegaly or hernia Breasts:  Examined lying and sitting without masses, retractions, discharge or axillary adenopathy. Pelvic:  Ext, BUS, Vagina: Normal  Adnexa: Without masses or tenderness    Anus and perineum: Normal   Rectovaginal: Normal sphincter tone without palpated masses or tenderness.    Assessment/Plan:  48 y.o. G0P0000 female for annual gynecologic exam.  Status post TAH/BSO  1. HRT.  On estradiol 0.5 mg.  Still notices significant hot flushes and sweats.  We again discussed the risks versus benefits to include increased risk of thrombosis such as stroke heart attack DVT and the breast cancer issue.  Currently on 0.5 mg.  Will increase to 1 mg and see how she does with this.  She will call if she continues to have significant hot flashes. 2. Mammography 07/2017.  Overdue and patient will schedule.  Breast exam normal today. 3. Pap smear/HPV 2018.  No Pap smear done today.  No history of significant  abnormal Pap smears. 4. Health maintenance.  No routine lab work done as patient does this elsewhere.  Follow-up 1 year, sooner as needed.   Anastasio Auerbach MD, 8:40 AM 06/21/2019

## 2019-06-21 NOTE — Patient Instructions (Signed)
Try the new dose of estrogen.  If the hot flushes continue to be an issue call the office.  Schedule your mammogram  Follow-up in 1 year for annual exam

## 2019-06-21 NOTE — Patient Instructions (Addendum)
   Please send me a mychart message regarding mood once you have been on higher dose of estradiol for the next 2-4 weeks.   If you have lab work done today you will be contacted with your lab results within the next 2 weeks.  If you have not heard from [place] then please contact us. The fastest way to get your results is to register for My Chart.   IF you received an x-ray today, you will receive an invoice from [facility]. Please contact [facility] at [phone] with questions or concerns regarding your invoice.   IF you received labwork today, you will receive an invoice from [facility]. Please contact [facility] at [phone] with questions or concerns regarding your invoice.   Our billing staff will not be able to assist you with questions regarding bills from these companies.  You will be contacted with the lab results as soon as they are available. The fastest way to get your results is to activate your My Chart account. Instructions are located on the last page of this paperwork. If you have not heard from [place] regarding the results in 2 weeks, please contact this office.

## 2019-06-22 LAB — COMPREHENSIVE METABOLIC PANEL
ALT: 40 IU/L — ABNORMAL HIGH (ref 0–32)
AST: 29 IU/L (ref 0–40)
Albumin/Globulin Ratio: 1.7 (ref 1.2–2.2)
Albumin: 4 g/dL (ref 3.8–4.8)
Alkaline Phosphatase: 92 IU/L (ref 39–117)
BUN/Creatinine Ratio: 23 (ref 9–23)
BUN: 17 mg/dL (ref 6–24)
Bilirubin Total: 0.3 mg/dL (ref 0.0–1.2)
CO2: 24 mmol/L (ref 20–29)
Calcium: 9.2 mg/dL (ref 8.7–10.2)
Chloride: 104 mmol/L (ref 96–106)
Creatinine, Ser: 0.74 mg/dL (ref 0.57–1.00)
GFR calc Af Amer: 112 mL/min/{1.73_m2} (ref >59)
GFR calc non Af Amer: 97 mL/min/{1.73_m2} (ref >59)
Globulin, Total: 2.3 g/dL (ref 1.5–4.5)
Glucose: 151 mg/dL — ABNORMAL HIGH (ref 65–99)
Potassium: 4.3 mmol/L (ref 3.5–5.2)
Sodium: 141 mmol/L (ref 134–144)
Total Protein: 6.3 g/dL (ref 6.0–8.5)

## 2019-06-22 LAB — TSH: TSH: 1.68 u[IU]/mL (ref 0.450–4.500)

## 2019-06-22 LAB — HEMOGLOBIN A1C
Est. average glucose Bld gHb Est-mCnc: 166 mg/dL
Hgb A1c MFr Bld: 7.4 % — ABNORMAL HIGH (ref 4.8–5.6)

## 2019-06-28 ENCOUNTER — Other Ambulatory Visit: Payer: Self-pay | Admitting: Family Medicine

## 2019-06-28 MED ORDER — DAPAGLIFLOZIN PROPANEDIOL 10 MG PO TABS: 10.0000 mg | ORAL_TABLET | Freq: Every day | ORAL | 1 refills | Status: DC

## 2019-07-30 ENCOUNTER — Other Ambulatory Visit: Payer: Self-pay | Admitting: Family Medicine

## 2019-08-03 ENCOUNTER — Other Ambulatory Visit: Payer: Self-pay | Admitting: Family Medicine

## 2019-08-03 ENCOUNTER — Other Ambulatory Visit: Payer: Self-pay

## 2019-08-03 DIAGNOSIS — M5010 Cervical disc disorder with radiculopathy, unspecified cervical region: Secondary | ICD-10-CM

## 2019-08-03 MED ORDER — DICLOFENAC SODIUM 75 MG PO TBEC: 75.0000 mg | DELAYED_RELEASE_TABLET | Freq: Two times a day (BID) | ORAL | 0 refills | Status: DC

## 2019-08-03 NOTE — Telephone Encounter (Signed)
See note - ok to go ahead with a third cervical ESI.  She has symptoms on both sides and arms so would leave it to IR's discretion to do one side or both.  Thanks!

## 2019-08-04 ENCOUNTER — Ambulatory Visit
Admission: RE | Admit: 2019-08-04 | Discharge: 2019-08-04 | Disposition: A | Discharge status: Home / Self Care | Payer: 59 | Source: Ambulatory Visit | Attending: Family Medicine | Admitting: Family Medicine

## 2019-08-04 DIAGNOSIS — M5010 Cervical disc disorder with radiculopathy, unspecified cervical region: Secondary | ICD-10-CM

## 2019-08-04 MED ORDER — TRIAMCINOLONE ACETONIDE 40 MG/ML IJ SUSP (RADIOLOGY)
60.0000 mg | Freq: Once | INTRAMUSCULAR | Status: AC
  Administered 2019-08-04: 60 mg via EPIDURAL

## 2019-08-04 MED ORDER — IOPAMIDOL (ISOVUE-M 300) INJECTION 61%
1.0000 mL | Freq: Once | INTRAMUSCULAR | Status: AC | PRN
  Administered 2019-08-04: 1 mL via EPIDURAL

## 2019-08-04 NOTE — Discharge Instructions (Signed)

## 2019-08-12 ENCOUNTER — Encounter: Payer: Self-pay | Admitting: Family Medicine

## 2019-08-21 ENCOUNTER — Encounter: Payer: Self-pay | Admitting: Family Medicine

## 2019-08-25 ENCOUNTER — Encounter: Payer: Self-pay | Admitting: Family Medicine

## 2019-08-26 ENCOUNTER — Other Ambulatory Visit: Payer: Self-pay

## 2019-08-26 ENCOUNTER — Other Ambulatory Visit (HOSPITAL_COMMUNITY)
Admission: RE | Admit: 2019-08-26 | Discharge: 2019-08-26 | Disposition: A | Discharge status: Home / Self Care | Payer: 59 | Source: Ambulatory Visit | Attending: Adult Health Nurse Practitioner | Admitting: Adult Health Nurse Practitioner

## 2019-08-26 ENCOUNTER — Ambulatory Visit (INDEPENDENT_AMBULATORY_CARE_PROVIDER_SITE_OTHER): Payer: 59 | Admitting: Adult Health Nurse Practitioner

## 2019-08-26 VITALS — BP 144/84 | HR 102 | Temp 98.2°F | Ht 62.0 in | Wt 200.2 lb

## 2019-08-26 DIAGNOSIS — B373 Candidiasis of vulva and vagina: Secondary | ICD-10-CM

## 2019-08-26 DIAGNOSIS — F418 Other specified anxiety disorders: Secondary | ICD-10-CM

## 2019-08-26 DIAGNOSIS — N898 Other specified noninflammatory disorders of vagina: Secondary | ICD-10-CM | POA: Insufficient documentation

## 2019-08-26 DIAGNOSIS — E1165 Type 2 diabetes mellitus with hyperglycemia: Secondary | ICD-10-CM

## 2019-08-26 DIAGNOSIS — B3731 Acute candidiasis of vulva and vagina: Secondary | ICD-10-CM

## 2019-08-26 DIAGNOSIS — E8941 Symptomatic postprocedural ovarian failure: Secondary | ICD-10-CM

## 2019-08-26 DIAGNOSIS — Z720 Tobacco use: Secondary | ICD-10-CM

## 2019-08-26 DIAGNOSIS — I10 Essential (primary) hypertension: Secondary | ICD-10-CM

## 2019-08-26 HISTORY — DX: Other specified noninflammatory disorders of vagina: N89.8

## 2019-08-26 HISTORY — DX: Type 2 diabetes mellitus with hyperglycemia: E11.65

## 2019-08-26 LAB — POCT URINALYSIS DIP (MANUAL ENTRY)
Bilirubin, UA: NEGATIVE
Glucose, UA: 500 mg/dL — AB
Ketones, POC UA: NEGATIVE mg/dL
Leukocytes, UA: NEGATIVE
Nitrite, UA: NEGATIVE
Protein Ur, POC: >=300 mg/dL — AB
Spec Grav, UA: 1.025 (ref 1.010–1.025)
Urobilinogen, UA: 1 E.U./dL
pH, UA: 7 (ref 5.0–8.0)

## 2019-08-26 MED ORDER — LOSARTAN POTASSIUM-HCTZ 50-12.5 MG PO TABS: 1.0000 | ORAL_TABLET | Freq: Every day | ORAL | 3 refills | Status: AC

## 2019-08-26 MED ORDER — BUSPIRONE HCL 5 MG PO TABS: ORAL_TABLET | ORAL | 0 refills | Status: DC

## 2019-08-26 MED ORDER — FLUCONAZOLE 150 MG PO TABS: 150.0000 mg | ORAL_TABLET | Freq: Every day | ORAL | 3 refills | Status: AC

## 2019-08-26 MED ORDER — TRULICITY 0.75 MG/0.5ML ~~LOC~~ SOAJ: 0.7500 mg | SUBCUTANEOUS | 3 refills | Status: DC

## 2019-08-26 NOTE — Patient Instructions (Addendum)
Diabetes Mellitus Autoantibody Panel Test Why am I having this test? A diabetes mellitus autoantibody panel may be used:  To evaluate insulin resistance and your risk for developing diabetes (diabetes mellitus). Insulin resistance occurs when cells in the body do not respond properly to insulin made by the pancreas and are not able to absorb sugar (glucose) from the bloodstream. This makes you more likely to develop diabetes.  To help diagnose type 1 diabetes.  To determine if your diabetes is type 1 or type 2.  To monitor your condition after receiving a pancreatic islet cell transplant. What is being tested? This test checks for antibodies in the blood that are related to type 1 diabetes. Antibodies are proteins that the body makes in response to insulin and other related chemicals. What kind of sample is taken?  A blood sample is required for this test. It is usually collected by inserting a needle into a blood vessel. Tell a health care provider about:  Any allergies you have.  All medicines you are taking, including vitamins, herbs, eye drops, creams, and over-the-counter medicines.  Any blood disorders you have.  Any surgeries you have had.  Any medical conditions you have.  Whether you are pregnant or may be pregnant. How are the results reported? Your results will be reported as a ratio, which is referred to as a titer. The ratio is an indicator of the amount of antibodies in your blood. Your health care provider will compare your results to normal ranges that were established after testing a large group of people (reference ranges). Reference ranges may vary among labs and hospitals. For this test, a common reference range is less than 1:4 titer. What do the results mean? Results of less than 1:4 titer are considered normal, meaning that you have none of these antibodies in your blood, and you do not have insulin resistance. Results higher than the reference range may  mean that you may have:  Insulin resistance.  Type 1 diabetes. You may need more tests to confirm this diagnosis.  Low blood sugar caused by giving yourself too much insulin.  A higher risk of rejection after pancreatic islet cell transplant. Talk with your health care provider about what your results mean. Questions to ask your health care provider Ask your health care provider, or the department that is doing the test:  When will my results be ready?  How will I get my results?  What are my treatment options?  What other tests do I need?  What are my next steps? Summary  This test checks for antibodies in the blood that are related to type 1 diabetes. Antibodies are proteins that the body makes in response to insulin and other related chemicals.  You may have this test to help determine if you have type 1 or type 2 diabetes. You may need more tests to confirm a diagnosis of type 1 diabetes.  Your results will be reported as a ratio, which is referred to as a titer. The ratio is an indicator of the amount of antibodies in your blood. This information is not intended to replace advice given to you by your health care provider. Make sure you discuss any questions you have with your health care provider. Document Revised: 05/30/2017 Document Reviewed: 02/04/2017 Elsevier Patient Education  2020 [employer] for Diabetes Mellitus, Adult  Carbohydrate counting is a method of keeping track of how many carbohydrates you eat. Eating carbohydrates naturally increases the  amount of sugar (glucose) in the blood. Counting how many carbohydrates you eat helps keep your blood glucose within normal limits, which helps you manage your diabetes (diabetes mellitus). It is important to know how many carbohydrates you can safely have in each meal. This is different for every person. A diet and nutrition specialist (registered dietitian) can help you make a meal plan and  calculate how many carbohydrates you should have at each meal and snack. Carbohydrates are found in the following foods:  Grains, such as breads and cereals.  Dried beans and soy products.  Starchy vegetables, such as potatoes, peas, and corn.  Fruit and fruit juices.  Milk and yogurt.  Sweets and snack foods, such as cake, cookies, candy, chips, and soft drinks. How do I count carbohydrates? There are two ways to count carbohydrates in food. You can use either of the methods or a combination of both. Reading "Nutrition Facts" on packaged food The "Nutrition Facts" list is included on the labels of almost all packaged foods and beverages in the [place] It includes:  The serving size.  Information about nutrients in each serving, including the grams (g) of carbohydrate per serving. To use the "Nutrition Facts":  Decide how many servings you will have.  Multiply the number of servings by the number of carbohydrates per serving.  The resulting number is the total amount of carbohydrates that you will be having. Learning standard serving sizes of other foods When you eat carbohydrate foods that are not packaged or do not include "Nutrition Facts" on the label, you need to measure the servings in order to count the amount of carbohydrates:  Measure the foods that you will eat with a food scale or measuring cup, if needed.  Decide how many standard-size servings you will eat.  Multiply the number of servings by 15. Most carbohydrate-rich foods have about 15 g of carbohydrates per serving. ? For example, if you eat 8 oz (170 g) of strawberries, you will have eaten 2 servings and 30 g of carbohydrates (2 servings x 15 g = 30 g).  For foods that have more than one food mixed, such as soups and casseroles, you must count the carbohydrates in each food that is included. The following list contains standard serving sizes of common carbohydrate-rich foods. Each of these servings has about 15  g of carbohydrates:   hamburger bun or  English muffin.   oz (15 mL) syrup.   oz (14 g) jelly.  1 slice of bread.  1 six-inch tortilla.  3 oz (85 g) cooked rice or pasta.  4 oz (113 g) cooked dried beans.  4 oz (113 g) starchy vegetable, such as peas, corn, or potatoes.  4 oz (113 g) hot cereal.  4 oz (113 g) mashed potatoes or  of a large baked potato.  4 oz (113 g) canned or frozen fruit.  4 oz (120 mL) fruit juice.  4-6 crackers.  6 chicken nuggets.  6 oz (170 g) unsweetened dry cereal.  6 oz (170 g) plain fat-free yogurt or yogurt sweetened with artificial sweeteners.  8 oz (240 mL) milk.  8 oz (170 g) fresh fruit or one small piece of fruit.  24 oz (680 g) popped popcorn. Example of carbohydrate counting Sample meal  3 oz (85 g) chicken breast.  6 oz (170 g) brown rice.  4 oz (113 g) corn.  8 oz (240 mL) milk.  8 oz (170 g) strawberries with sugar-free whipped topping. Carbohydrate  calculation 1. Identify the foods that contain carbohydrates: ? Rice. ? Corn. ? Milk. ? Strawberries. 2. Calculate how many servings you have of each food: ? 2 servings rice. ? 1 serving corn. ? 1 serving milk. ? 1 serving strawberries. 3. Multiply each number of servings by 15 g: ? 2 servings rice x 15 g = 30 g. ? 1 serving corn x 15 g = 15 g. ? 1 serving milk x 15 g = 15 g. ? 1 serving strawberries x 15 g = 15 g. 4. Add together all of the amounts to find the total grams of carbohydrates eaten: ? 30 g + 15 g + 15 g + 15 g = 75 g of carbohydrates total. Summary  Carbohydrate counting is a method of keeping track of how many carbohydrates you eat.  Eating carbohydrates naturally increases the amount of sugar (glucose) in the blood.  Counting how many carbohydrates you eat helps keep your blood glucose within normal limits, which helps you manage your diabetes.  A diet and nutrition specialist (registered dietitian) can help you make a meal plan and  calculate how many carbohydrates you should have at each meal and snack. This information is not intended to replace advice given to you by your health care provider. Make sure you discuss any questions you have with your health care provider. Document Revised: 01/09/2017 Document Reviewed: 11/29/2015 Elsevier Patient Education  [place].  Diabetes Mellitus and Nutrition, Adult When you have diabetes (diabetes mellitus), it is very important to have healthy eating habits because your blood sugar (glucose) levels are greatly affected by what you eat and drink. Eating healthy foods in the appropriate amounts, at about the same times every day, can help you:  Control your blood glucose.  Lower your risk of heart disease.  Improve your blood pressure.  Reach or maintain a healthy weight. Every person with diabetes is different, and each person has different needs for a meal plan. Your health care provider may recommend that you work with a diet and nutrition specialist (dietitian) to make a meal plan that is best for you. Your meal plan may vary depending on factors such as:  The calories you need.  The medicines you take.  Your weight.  Your blood glucose, blood pressure, and cholesterol levels.  Your activity level.  Other health conditions you have, such as heart or kidney disease. How do carbohydrates affect me? Carbohydrates, also called carbs, affect your blood glucose level more than any other type of food. Eating carbs naturally raises the amount of glucose in your blood. Carb counting is a method for keeping track of how many carbs you eat. Counting carbs is important to keep your blood glucose at a healthy level, especially if you use insulin or take certain oral diabetes medicines. It is important to know how many carbs you can safely have in each meal. This is different for every person. Your dietitian can help you calculate how many carbs you should have at each meal  and for each snack. Foods that contain carbs include:  Bread, cereal, rice, pasta, and crackers.  Potatoes and corn.  Peas, beans, and lentils.  Milk and yogurt.  Fruit and juice.  Desserts, such as cakes, cookies, ice cream, and candy. How does alcohol affect me? Alcohol can cause a sudden decrease in blood glucose (hypoglycemia), especially if you use insulin or take certain oral diabetes medicines. Hypoglycemia can be a life-threatening condition. Symptoms of hypoglycemia (sleepiness, dizziness, and  confusion) are similar to symptoms of having too much alcohol. If your health care provider says that alcohol is safe for you, follow these guidelines:  Limit alcohol intake to no more than 1 drink per day for nonpregnant women and 2 drinks per day for men. One drink equals 12 oz of beer, 5 oz of wine, or 1 oz of hard liquor.  Do not drink on an empty stomach.  Keep yourself hydrated with water, diet soda, or unsweetened iced tea.  Keep in mind that regular soda, juice, and other mixers may contain a lot of sugar and must be counted as carbs. What are tips for following this plan?  Reading food labels  Start by checking the serving size on the "Nutrition Facts" label of packaged foods and drinks. The amount of calories, carbs, fats, and other nutrients listed on the label is based on one serving of the item. Many items contain more than one serving per package.  Check the total grams (g) of carbs in one serving. You can calculate the number of servings of carbs in one serving by dividing the total carbs by 15. For example, if a food has 30 g of total carbs, it would be equal to 2 servings of carbs.  Check the number of grams (g) of saturated and trans fats in one serving. Choose foods that have low or no amount of these fats.  Check the number of milligrams (mg) of salt (sodium) in one serving. Most people should limit total sodium intake to less than 2,300 mg per day.  Always  check the nutrition information of foods labeled as "low-fat" or "nonfat". These foods may be higher in added sugar or refined carbs and should be avoided.  Talk to your dietitian to identify your daily goals for nutrients listed on the label. Shopping  Avoid buying canned, premade, or processed foods. These foods tend to be high in fat, sodium, and added sugar.  Shop around the outside edge of the grocery store. This includes fresh fruits and vegetables, bulk grains, fresh meats, and fresh dairy. Cooking  Use low-heat cooking methods, such as baking, instead of high-heat cooking methods like deep frying.  Cook using healthy oils, such as olive, canola, or sunflower oil.  Avoid cooking with butter, cream, or high-fat meats. Meal planning  Eat meals and snacks regularly, preferably at the same times every day. Avoid going long periods of time without eating.  Eat foods high in fiber, such as fresh fruits, vegetables, beans, and whole grains. Talk to your dietitian about how many servings of carbs you can eat at each meal.  Eat 4-6 ounces (oz) of lean protein each day, such as lean meat, chicken, fish, eggs, or tofu. One oz of lean protein is equal to: ? 1 oz of meat, chicken, or fish. ? 1 egg. ?  cup of tofu.  Eat some foods each day that contain healthy fats, such as avocado, nuts, seeds, and fish. Lifestyle  Check your blood glucose regularly.  Exercise regularly as told by your health care provider. This may include: ? 150 minutes of moderate-intensity or vigorous-intensity exercise each week. This could be brisk walking, biking, or water aerobics. ? Stretching and doing strength exercises, such as yoga or weightlifting, at least 2 times a week.  Take medicines as told by your health care provider.  Do not use any products that contain nicotine or tobacco, such as cigarettes and e-cigarettes. If you need help quitting, ask your health care  provider.  Work with a Social worker or  diabetes educator to identify strategies to manage stress and any emotional and social challenges. Questions to ask a health care provider  Do I need to meet with a diabetes educator?  Do I need to meet with a dietitian?  What number can I call if I have questions?  When are the best times to check my blood glucose? Where to find more information:  American Diabetes Association: diabetes.org  Academy of Nutrition and Dietetics: www.eatright.[employer] of Diabetes and Digestive and Kidney Diseases (NIH): [employer] Summary  A healthy meal plan will help you control your blood glucose and maintain a healthy lifestyle.  Working with a diet and nutrition specialist (dietitian) can help you make a meal plan that is best for you.  Keep in mind that carbohydrates (carbs) and alcohol have immediate effects on your blood glucose levels. It is important to count carbs and to use alcohol carefully. This information is not intended to replace advice given to you by your health care provider. Make sure you discuss any questions you have with your health care provider. Document Revised: 05/30/2017 Document Reviewed: 07/22/2016 Elsevier Patient Education  2020 [employer].

## 2019-08-27 ENCOUNTER — Other Ambulatory Visit: Payer: Self-pay

## 2019-08-27 ENCOUNTER — Telehealth: Payer: Self-pay | Admitting: Adult Health Nurse Practitioner

## 2019-08-27 DIAGNOSIS — E1165 Type 2 diabetes mellitus with hyperglycemia: Secondary | ICD-10-CM

## 2019-08-27 LAB — CERVICOVAGINAL ANCILLARY ONLY
Bacterial Vaginitis (gardnerella): NEGATIVE
Candida Glabrata: NEGATIVE
Candida Vaginitis: POSITIVE — AB
Chlamydia: NEGATIVE
Comment: NEGATIVE
Comment: NEGATIVE
Comment: NEGATIVE
Comment: NEGATIVE
Comment: NEGATIVE
Comment: NORMAL
Neisseria Gonorrhea: NEGATIVE
Trichomonas: NEGATIVE

## 2019-08-27 MED ORDER — TRULICITY 0.75 MG/0.5ML ~~LOC~~ SOAJ: 0.7500 mg | SUBCUTANEOUS | 3 refills | Status: AC

## 2019-08-27 NOTE — Telephone Encounter (Signed)
Patient calling to inform that the pharmacy needs clarification on the dosage for Trulicity   Please advise

## 2019-08-27 NOTE — Telephone Encounter (Signed)
Done

## 2019-08-28 ENCOUNTER — Other Ambulatory Visit: Payer: Self-pay | Admitting: Family Medicine

## 2019-08-31 ENCOUNTER — Telehealth: Payer: 59 | Admitting: Family Medicine

## 2019-09-07 ENCOUNTER — Encounter: Payer: Self-pay | Admitting: Adult Health Nurse Practitioner

## 2019-09-07 NOTE — Progress Notes (Signed)
Chief Complaint  Patient presents with  . Vaginitis    Pt stated that she thinks she has a yeast infection. She has been burning and itching for the paast 4 days    HPI   Patient presents for a couple days of itching, discharge, discomfort in the vaginal area.  She is due for medications as well and she has a moderate amount of blood and protein in her urine.  Needs refills of her medications.  Blood pressure today is elevated.  A1c has not been recently checked.  Blood sugars have not been very controlled.  We discussed better control and when to take blood sugars.  She does note some depression that the Effexor is not quite covering.  Effexor was initially started for hot flashes.    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  Medications    Current Outpatient Medications:  .  atorvastatin (LIPITOR) 40 MG tablet, Take 1 tablet (40 mg total) by mouth daily., Disp: 90 tablet, Rfl: 3 .  cyclobenzaprine (FLEXERIL) 10 MG tablet, Take 1 tablet (10 mg total) by mouth at bedtime as needed for muscle spasms., Disp: 60 tablet, Rfl: 1 .  dapagliflozin propanediol (FARXIGA) 10 MG TABS tablet, Take 10 mg by mouth daily before breakfast., Disp: 90 tablet, Rfl: 1 .  diclofenac (VOLTAREN) 75 MG EC tablet, Take  1 tablet (75 mg total) by mouth 2 (two) times daily., Disp: 180 tablet, Rfl: 0 .  estradiol (ESTRACE) 1 MG tablet, Take 1 tablet (1 mg total) by mouth daily., Disp: 90 tablet, Rfl: 4 .  gabapentin (NEURONTIN) 300 MG capsule, Take 1 capsule (300 mg total) by mouth 3 (three) times daily., Disp: 90 capsule, Rfl: 3 .  losartan (COZAAR) 50 MG tablet, TAKE 1 TABLET BY MOUTH EVERY DAY, Disp: 90 tablet, Rfl: 1 .  metFORMIN (GLUCOPHAGE) 1000 MG tablet, TAKE 1 TABLET (1,000 MG TOTAL) BY MOUTH 2 (TWO) TIMES DAILY WITH A MEAL., Disp: 180 tablet, Rfl: 1 .  busPIRone (BUSPAR) 5 MG tablet, 1 tab po tid (start with one tab po qday before increasing, Disp: 90 tablet, Rfl: 0 .  Dulaglutide (TRULICITY) A999333 [id number] SOPN, Inject 0.75 mg into the skin once a week for 28 days., Disp: 4 pen, Rfl: 3 .  losartan-hydrochlorothiazide (HYZAAR) 50-12.5 MG tablet, Take 1 tablet by mouth daily., Disp: 90 tablet, Rfl: 3 .  venlafaxine XR (EFFEXOR-XR) 150 MG 24 hr capsule, TAKE 1 CAPSULE (150 MG TOTAL) BY MOUTH DAILY WITH BREAKFAST., Disp: 90 capsule, Rfl: 0   Review of Systems    Constitutional: Negative for activity change, appetite change, chills and fever.  HENT: Negative for congestion, nosebleeds, trouble swallowing and voice change.   Respiratory: Negative for cough, shortness of breath and wheezing.   Cardiac:  Negative for chest pain, pressure, syncope  Gastrointestinal: Negative for diarrhea, nausea and vomiting.  Genitourinary: Negative for difficulty urinating, dysuria, flank pain and hematuria.  Musculoskeletal:  Negative for back pain, joint swelling and neck pain.  Neurological: Negative for dizziness, speech difficulty, light-headedness and numbness.  See HPI. All other review of systems negative.     Physical Exam:    height is 5\' 2"  (1.575 m) and weight is 200 lb 3.2 oz (90.8 kg). Her temporal temperature is 98.2 F (36.8 C). Her blood pressure is 144/84 (abnormal) and her pulse is 102 (abnormal). Her  oxygen saturation is 95%.   Physical Examination: General appearance - alert, well appearing, and in no distress and oriented to person, place, and time Mental status - normal mood, behavior, speech, dress, motor activity, and thought processes Eyes - PERRL. Extraocular movements intact.  No nystagmus.  Neck - supple, no significant adenopathy, carotids upstroke normal bilaterally, no bruits, thyroid exam: thyroid is normal in size without nodules or tenderness Chest - clear to auscultation, no wheezes, rales or rhonchi, symmetric air entry  Heart - normal rate, regular rhythm, normal S1, S2, no murmurs, rubs, clicks or gallops Extremities - dependent LE edema without clubbing or cyanosis Skin - normal coloration and turgor, no rashes, no suspicious skin lesions noted  No hyperpigmentation of skin.  No current hematomas noted   Lab /Imaging Review    orders written for new lab studies as appropriate; see orders.   Assessment & Plan:  Jody Duke is a 49 y.o. female    1. Vaginal discharge   2. Yeast vaginitis   3. Depression with anxiety   4. Tobacco abuse   5. Essential hypertension   6. Uncontrolled type 2 diabetes mellitus with hyperglycemia ([place])   7. Hot flashes due to surgical menopause    Orders Placed This Encounter  Procedures  . POCT urinalysis dipstick   Meds ordered this encounter  Medications  . fluconazole (DIFLUCAN) 150 MG tablet    Sig: Take 1 tablet (150 mg total) by mouth daily for 3 doses.    Dispense:  3 tablet    Refill:  3  . DISCONTD: Dulaglutide (TRULICITY) [id number] SOPN    Sig: Inject 0.75 mg into the skin once a week for 5 days.    Dispense:  5 pen    Refill:  3  . losartan-hydrochlorothiazide (HYZAAR) 50-12.5 MG tablet    Sig: Take 1 tablet by mouth daily.    Dispense:  90 tablet    Refill:  3  . busPIRone (BUSPAR) 5 MG tablet    Sig: 1 tab po tid (start with one tab po qday before increasing    Dispense:  90 tablet    Refill:  0      Glyn Ade, NP

## 2019-09-21 ENCOUNTER — Ambulatory Visit: Payer: 59 | Admitting: Family Medicine

## 2019-09-24 ENCOUNTER — Ambulatory Visit: Payer: 59 | Attending: Internal Medicine

## 2019-09-24 ENCOUNTER — Ambulatory Visit: Payer: 59 | Admitting: Adult Health Nurse Practitioner

## 2019-09-24 DIAGNOSIS — Z23 Encounter for immunization: Secondary | ICD-10-CM

## 2019-09-24 NOTE — Progress Notes (Signed)
   [facility] Vaccination [facility]  Name:  LAN CHANNELL    MRN: [id number] DOB: [birth year]  09/24/2019  Ms. Moyeda was observed post Covid-19 immunization for 15 minutes without incident. She was provided with Vaccine Information Sheet and instruction to access the V-Safe system.   Ms. Fraim was instructed to call 911 with any severe reactions post vaccine: Marland Kitchen Difficulty breathing  . Swelling of face and throat  . A fast heartbeat  . A bad rash all over body  . Dizziness and weakness   Immunizations Administered    Name Date Dose VIS Date Route   Pfizer COVID-19 Vaccine 09/24/2019  8:45 AM 0.3 mL 06/11/2019 Intramuscular   Manufacturer: [place]   Lot: [id number]   [place]: [id number]

## 2019-09-29 ENCOUNTER — Encounter: Payer: Self-pay | Admitting: Adult Health Nurse Practitioner

## 2019-09-29 ENCOUNTER — Other Ambulatory Visit: Payer: Self-pay

## 2019-09-29 ENCOUNTER — Ambulatory Visit: Payer: 59 | Admitting: Adult Health Nurse Practitioner

## 2019-09-29 VITALS — BP 129/85 | HR 103 | Temp 98.0°F | Ht 62.0 in | Wt 202.4 lb

## 2019-09-29 DIAGNOSIS — F418 Other specified anxiety disorders: Secondary | ICD-10-CM

## 2019-09-29 DIAGNOSIS — Z803 Family history of malignant neoplasm of breast: Secondary | ICD-10-CM

## 2019-09-29 DIAGNOSIS — E1129 Type 2 diabetes mellitus with other diabetic kidney complication: Secondary | ICD-10-CM

## 2019-09-29 DIAGNOSIS — E7800 Pure hypercholesterolemia, unspecified: Secondary | ICD-10-CM

## 2019-09-29 DIAGNOSIS — I10 Essential (primary) hypertension: Secondary | ICD-10-CM

## 2019-09-29 DIAGNOSIS — E559 Vitamin D deficiency, unspecified: Secondary | ICD-10-CM

## 2019-09-29 LAB — POCT URINALYSIS DIP (MANUAL ENTRY)
Bilirubin, UA: NEGATIVE
Glucose, UA: 500 mg/dL — AB
Ketones, POC UA: NEGATIVE mg/dL
Leukocytes, UA: NEGATIVE
Nitrite, UA: NEGATIVE
Protein Ur, POC: 100 mg/dL — AB
Spec Grav, UA: 1.02 (ref 1.010–1.025)
Urobilinogen, UA: 0.2 E.U./dL
pH, UA: 5.5 (ref 5.0–8.0)

## 2019-09-29 LAB — GLUCOSE, POCT (MANUAL RESULT ENTRY): POC Glucose: 114 mg/dl — AB (ref 70–99)

## 2019-09-29 MED ORDER — IBUPROFEN 800 MG PO TABS: 800.0000 mg | ORAL_TABLET | Freq: Three times a day (TID) | ORAL | 3 refills | Status: DC | PRN

## 2019-09-29 MED ORDER — FLUCONAZOLE 150 MG PO TABS: 150.0000 mg | ORAL_TABLET | Freq: Once | ORAL | 0 refills | Status: AC

## 2019-09-29 MED ORDER — BUSPIRONE HCL 10 MG PO TABS: 10.0000 mg | ORAL_TABLET | Freq: Three times a day (TID) | ORAL | 0 refills | Status: AC

## 2019-09-29 MED ORDER — GABAPENTIN 600 MG PO TABS: 600.0000 mg | ORAL_TABLET | Freq: Three times a day (TID) | ORAL | 3 refills | Status: DC

## 2019-09-29 MED ORDER — VENLAFAXINE HCL ER 75 MG PO CP24: 75.0000 mg | ORAL_CAPSULE | Freq: Every day | ORAL | 3 refills | Status: DC

## 2019-09-29 NOTE — Progress Notes (Unsigned)
Multiple Subjective:     Jody Duke is a 49 y.o. female.  Chief Complaint: Dyslipidemia Patient presents for evaluation of lipids. Compliance with treatment thus far has been {good/fair/poor:33178}. A repeat fasting lipid profile {was/was not:31712} done. The patient {does/does not:19097} use medications that may worsen dyslipidemias (corticosteroids, progestins, anabolic steroids, diuretics, beta-blockers, amiodarone, cyclosporine, olanzapine). The patient exercises {exercise:19169}.  The patient {is/is not:9024} known to have coexisting coronary artery disease.   Cardiac Risk Factors Age > 45-female, > 55-female:  {yes +1:16996}  Smoking:   {yes +1:16996}  Sig. family hx of CHD*:  {yes +1:16996}  Hypertension:   {yes +1:16996}  Diabetes:   {yes +1:16996}  HDL < 35:   {yes +1:16996}  HDL > 59:   {yes -1:16997}  Total: {Numbers; JS:[id number]  *- Significant family history of Coronary Heart Disease per National Cholesterol Education Program = Myocardial Infarction or sudden death at less than 49 years old in  father or other 1st-degree female relative, or less than 3 years old in mother or  other 1st-degree female relative.  Hypertension Patient is here for follow-up of elevated blood pressure. She is not exercising and is adherent to a low-salt diet. Blood pressure unknown well controlled at home. Cardiac symptoms: chest pressure/discomfort and fatigue. Patient denies dyspnea, lower extremity edema and syncope. Cardiovascular risk factors: diabetes mellitus, dyslipidemia, family history of premature cardiovascular disease, hypertension, microalbuminuria, obesity (BMI >= 30 kg/m2), sedentary lifestyle and smoking/ tobacco exposure. Use of agents associated with hypertension: NSAIDS. History of target organ damage: none. Additional Complaints: Fatigue Patient complains of fatigue. Symptoms began throughout the past year particularly with Covid and her 2019 consisted of multiple medical  problems and surgical procedures. The patient feels the fatigue began with: cold intolerance, exercise intolerance and a witnessed or suspected sleep apnea. Symptoms of her fatigue have been anxiousness, fatigue with paradoxical insomnia, feelings of depression, general malaise, headaches, hypersomnolence and lack of interest in usual activities. Patient describes the following psychological symptoms: depression, stress at work and stress in the family. Patient denies GI blood loss, significant change in weight and unusual rashes. Symptoms have progressed to a point and plateaued. Symptom severity: symptoms bothersome, but easily able to carry out all usual work/school/family activities. Previous visits for this problem: yes, last seen 1 month ago by me.  Diabetes Mellitus Patient presents for follow up of diabetes. Current symptoms include: hyperglycemia and paresthesia of the feet. Symptoms have gradually improved. Patient denies polydipsia and polyuria. Evaluation to date has included: fasting blood sugar, hemoglobin A1C and microalbuminuria.  Home sugars: checks a couple of times a month and have not been controlled. Current treatment: Continued Trulicity, Metformin, and Glipizide which has been effective. Last dilated eye exam: pending referral today . Depression Patient complains of depression. She complains of anhedonia, depressed mood, fatigue, hopelessness, hypersomnia and insomnia. Onset was approximately a few day ago. Symptoms have been mildly improved with the Effexor and addition of the Buspar.    since that time. Current symptoms include: anhedonia, depressed mood, fatigue, feelings of worthlessness/guilt, hypersomnia and insomnia. Patient denies psychomotor agitation, recurrent thoughts of death and weight loss. Family history significant for alcoholism, depression and heart disease. Possible organic causes contributing are: endocrine/metabolic, nutritional. Risk factors: negative life event  sister is ill, mother died at 27 of alcoholism, and she has a great amount of stress and previous episode of depression. Previous treatment includes medication. She complains of the following side effects from the treatment: none.   {Common ambulatory  SmartLinks:19316}  Review of Systems {ros; complete:30496}    Objective:    BP 129/85 (BP Location: Right Arm, Patient Position: Sitting, Cuff Size: Normal)   Pulse (!) 103   Temp 98 F (36.7 C) (Temporal)   Ht 5\' 2"  (1.575 m)   Wt 202 lb 6.4 oz (91.8 kg)   LMP 09/21/2017 (Exact Date)   SpO2 94%   BMI 37.02 kg/m  {exam; complete:17964}    Assessment:     1. Essential hypertension   2. Type 2 diabetes mellitus with microalbuminuria, without long-term current use of insulin (HCC)   3. Depression with anxiety   4. Pure hypercholesterolemia   5. Vitamin D deficiency   6. Family hx-breast malignancy    ***     Plan:     ***

## 2019-09-29 NOTE — Patient Instructions (Addendum)
Cancer Screening for Women A cancer screening is a test or exam that checks for cancer. Your health care provider will recommend specific cancer screenings based on your age, personal history, and family history of cancer. Work with your health care provider to create a cancer screening schedule that protects your health. Why is cancer screening done? Cancer screening is done to look for cancer in the very early stages, before it spreads and becomes harder to treat and before you would start to notice symptoms. Finding cancer early improves the chances of successful treatment. It may save your life. Who should be screened for cancer? All women should be screened for certain cancers, including breast cancer, cervical cancer, and skin cancer. Your health care provider may recommend screenings for other types of cancer if:  You had cancer before.  You have a family member with cancer.  You have abnormal genes that could increase the risk of cancer.  You have risk factors for certain cancers, such as smoking. When you should be screened for cancer depends on:  Your age.  Your medical history and your family's medical history.  Certain lifestyle factors, such as smoking.  Environmental exposure, such as to asbestos. What are some common cancer screenings? Breast cancer Breast cancer screening is done with a test that takes images of breast tissue (mammogram). Here are some screening guidelines:  When you are age 48-44, you will be given the choice to start having mammograms.  When you are age 79-54, you should have a mammogram every year.  You may start having mammograms before age 39 if you have risk factors for breast cancer, such as having an immediate family member with breast cancer.  When you are age 46 or older, you should have a mammogram every 1-2 years for as long as you are in good health and have a life expectancy of 10 years or more.  It is important to know what your  breasts look and feel like so you can report any changes to your health care provider.  Cervical cancer Cervical cancer screening is done with a Pap test. This testchecks for abnormalities, including the virus that causes cervical cancer (human papillomavirus, or HPV). To perform the test, a health care provider takes a swab of cervical cells during a pelvic exam. Screening for cervical cancer with a Pap test should start at age 31. Here are some screening guidelines:  When you are age 37-29, you should have a Pap test every 3 years.  When you are age 91-65, you should have a Pap test and HPV test every 5 years or have a Pap test every 3 years.  You may be screened for cervical cancer more often if you have risk factors for cervical cancer.  If your Pap tests are abnormal, you may have an HPV test.  If you have had the HPV vaccine, you will still be screened for cervical cancer and follow normal screening recommendations. You do not need to be screened for cervical cancer if any of the following apply to you:  You are older than age 55 and you have not had a serious cervical precancer or cancer in the last 20 years.  Your cervix and uterus have been removed and you have never had cervical cancer or precancerous cells. Endometrial cancer There is no standard screening test for endometrial cancer, but the cancer can be detected with:  A test of a sample of tissue taken from the lining of the uterus (  endometrial tissue biopsy).  A vaginal ultrasound.  Pap tests. If you are at increased risk for endometrial cancer, you may need to have these tests more often than normal. You are at increased risk if:  You have a family history of ovarian, uterine, or colon cancer.  You are taking tamoxifen, a drug that is used to treat breast cancer.  You have certain types of colon cancer. If you have reached menopause, it is especially important to talk with your health care provider about any vaginal  bleeding or spotting. Screening for endometrial cancer is not recommended for women who do not have symptoms of the cancer, such as vaginal bleeding. Colorectal cancer  All adults should have screening for colorectal cancer starting at age 41 and continuing until age 35. Your health care provider may recommend screening at age 12. You will have tests every 1-10 years, depending on your results and the type of screening test. If you have a family history of colon or rectal cancer or other risk factors, you may need to start having screenings earlier. Talk with your health care provider about which screening test is right for you and how often you should be screened. Colorectal cancer screening looks for cancer or for growths called polyps that often form before cancer starts. Tests to look for cancer or polyps include:  Colonoscopy or flexible sigmoidoscopy. For these procedures, a flexible tube with a small camera is inserted into the rectum.  CT colonography. This test uses X-rays and a contrast dye to check the colon for polyps. If a polyp is found, you may need to have a colonoscopy so the polyp can be located and removed. Tests to look for cancer in the stool (feces) include:  Guaiac-based fecal occult blood test (FOBT). This test detects blood in stool. It can be done at home with a kit.  Fecal immunochemical test (FIT). This test detects blood in stool. For this test, you will need to collect stool samples at home.  Stool DNA test. This test looks for blood in stool and any changes in DNA that can lead to colon cancer. For this test, you will need to collect a stool sample at home and send it to a lab.  Skin cancer Skin cancer screening is done by checking the skin for unusual moles or spots and any changes in existing moles. Your health care provider should check your skin for signs of skin cancer at every physical exam. You should check your skin every month and tell your health care  provider right away if anything looks unusual. Women with a higher-than-normal risk for skin cancer may want to see a skin specialist (dermatologist) for an annual body check. Lung cancer Lung cancer screening is done with a CT scan that looks for abnormal cells in the lungs. Discuss lung cancer screening with your health care provider if you are 64-62 years old and if any of the following apply to you:  You currently smoke.  You used to smoke heavily.  You have a smoking history of 1 pack a day for 30 years or 2 packs a day for 15 years.  You have quit smoking within the past 15 years. If you smoke heavily or if you used to smoke, you may need to be screened every year. Where to find more information  [place]: SkinPromotion.no  Centers for Disease Control and Prevention: http://knight-sullivan.biz/  Department of Health and Human Services: BankingDetective.si Contact a health care provider if:  You have concerns about any signs or symptoms of cancer, such as: ? Moles that have an unusual shape or color. ? Changes in existing moles. ? A sore on your skin that does not heal. ? Blood in your stool. ? Fatigue that does not go away. ? Frequent pain or cramping in your abdomen. ? Coughing, or coughing up blood. ? Losing weight without trying. ? Lumps or other changes in your breasts. ? Vaginal bleeding, spotting, or changes in your periods. Summary  Be aware of and watch for signs and symptoms of cancer, especially symptoms of breast cancer, cervical cancer, endometrial cancer, colorectal cancer, skin cancer, and lung cancer.  Early detection of cancer with cancer screening may save your life.  Talk with your health care provider about your specific cancer risks.  Work together with your health care provider to create a cancer screening plan  that is right for you. This information is not intended to replace advice given to you by your health care provider. Make sure you discuss any questions you have with your health care provider. Document Revised: 10/07/2018 Document Reviewed: 03/14/2016 Elsevier Patient Education  [place] with Quitting Smoking  Quitting smoking is a physical and mental challenge. You will face cravings, withdrawal symptoms, and temptation. Before quitting, work with your health care provider to make a plan that can help you cope. Preparation can help you quit and keep you from giving in. How can I cope with cravings? Cravings usually last for 5-10 minutes. If you get through it, the craving will pass. Consider taking the following actions to help you cope with cravings:  Keep your mouth busy: ? Chew sugar-free gum. ? Suck on hard candies or a straw. ? Brush your teeth.  Keep your hands and body busy: ? Immediately change to a different activity when you feel a craving. ? Squeeze or play with a ball. ? Do an activity or a hobby, like making bead jewelry, practicing needlepoint, or working with wood. ? Mix up your normal routine. ? Take a short exercise break. Go for a quick walk or run up and down stairs. ? Spend time in public places where smoking is not allowed.  Focus on doing something kind or helpful for someone else.  Call a friend or family member to talk during a craving.  Join a support group.  Call a quit line, such as 1-800-QUIT-NOW.  Talk with your health care provider about medicines that might help you cope with cravings and make quitting easier for you. How can I deal with withdrawal symptoms? Your body may experience negative effects as it tries to get used to not having nicotine in the system. These effects are called withdrawal symptoms. They may include:  Feeling hungrier than normal.  Trouble concentrating.  Irritability.  Trouble sleeping.  Feeling  depressed.  Restlessness and agitation.  Craving a cigarette. To manage withdrawal symptoms:  Avoid places, people, and activities that trigger your cravings.  Remember why you want to quit.  Get plenty of sleep.  Avoid coffee and other caffeinated drinks. These may worsen some of your symptoms. How can I handle social situations? Social situations can be difficult when you are quitting smoking, especially in the first few weeks. To manage this, you can:  Avoid parties, bars, and other social situations where people might be smoking.  Avoid alcohol.  Leave right away if you have the urge to smoke.  Explain to your family and friends that you  are quitting smoking. Ask for understanding and support.  Plan activities with friends or family where smoking is not an option. What are some ways I can cope with stress? Wanting to smoke may cause stress, and stress can make you want to smoke. Find ways to manage your stress. Relaxation techniques can help. For example:  Breathe slowly and deeply, in through your nose and out through your mouth.  Listen to soothing, relaxing music.  Talk with a family member or friend about your stress.  Light a candle.  Soak in a bath or take a shower.  Think about a peaceful place. What are some ways I can prevent weight gain? Be aware that many people gain weight after they quit smoking. However, not everyone does. To keep from gaining weight, have a plan in place before you quit and stick to the plan after you quit. Your plan should include:  Having healthy snacks. When you have a craving, it may help to: ? Eat plain popcorn, crunchy carrots, celery, or other cut vegetables. ? Chew sugar-free gum.  Changing how you eat: ? Eat small portion sizes at meals. ? Eat 4-6 small meals throughout the day instead of 1-2 large meals a day. ? Be mindful when you eat. Do not watch television or do other things that might distract you as you  eat.  Exercising regularly: ? Make time to exercise each day. If you do not have time for a long workout, do short bouts of exercise for 5-10 minutes several times a day. ? Do some form of strengthening exercise, like weight lifting, and some form of aerobic exercise, like running or swimming.  Drinking plenty of water or other low-calorie or no-calorie drinks. Drink 6-8 glasses of water daily, or as much as instructed by your health care provider. Summary  Quitting smoking is a physical and mental challenge. You will face cravings, withdrawal symptoms, and temptation to smoke again. Preparation can help you as you go through these challenges.  You can cope with cravings by keeping your mouth busy (such as by chewing gum), keeping your body and hands busy, and making calls to family, friends, or a helpline for people who want to quit smoking.  You can cope with withdrawal symptoms by avoiding places where people smoke, avoiding drinks with caffeine, and getting plenty of rest.  Ask your health care provider about the different ways to prevent weight gain, avoid stress, and handle social situations. This information is not intended to replace advice given to you by your health care provider. Make sure you discuss any questions you have with your health care provider. Document Revised: 05/30/2017 Document Reviewed: 06/14/2016 Elsevier Patient Education  2020 [employer].

## 2019-09-30 ENCOUNTER — Encounter: Payer: Self-pay | Admitting: Family Medicine

## 2019-09-30 LAB — CMP14+EGFR
ALT: 31 IU/L (ref 0–32)
AST: 23 IU/L (ref 0–40)
Albumin/Globulin Ratio: 1.8 (ref 1.2–2.2)
Albumin: 4.2 g/dL (ref 3.8–4.8)
Alkaline Phosphatase: 94 IU/L (ref 39–117)
BUN/Creatinine Ratio: 25 — ABNORMAL HIGH (ref 9–23)
BUN: 16 mg/dL (ref 6–24)
Bilirubin Total: 0.5 mg/dL (ref 0.0–1.2)
CO2: 22 mmol/L (ref 20–29)
Calcium: 9.7 mg/dL (ref 8.7–10.2)
Chloride: 101 mmol/L (ref 96–106)
Creatinine, Ser: 0.63 mg/dL (ref 0.57–1.00)
GFR calc Af Amer: 124 mL/min/{1.73_m2} (ref >59)
GFR calc non Af Amer: 107 mL/min/{1.73_m2} (ref >59)
Globulin, Total: 2.3 g/dL (ref 1.5–4.5)
Glucose: 131 mg/dL — ABNORMAL HIGH (ref 65–99)
Potassium: 4.1 mmol/L (ref 3.5–5.2)
Sodium: 139 mmol/L (ref 134–144)
Total Protein: 6.5 g/dL (ref 6.0–8.5)

## 2019-09-30 LAB — HEMOGLOBIN A1C
Est. average glucose Bld gHb Est-mCnc: 163 mg/dL
Hgb A1c MFr Bld: 7.3 % — ABNORMAL HIGH (ref 4.8–5.6)

## 2019-09-30 LAB — LIPID PANEL
Chol/HDL Ratio: 4.4 ratio (ref 0.0–4.4)
Cholesterol, Total: 206 mg/dL — ABNORMAL HIGH (ref 100–199)
HDL: 47 mg/dL (ref >39)
LDL Chol Calc (NIH): 103 mg/dL — ABNORMAL HIGH (ref 0–99)
Triglycerides: 331 mg/dL — ABNORMAL HIGH (ref 0–149)
VLDL Cholesterol Cal: 56 mg/dL — ABNORMAL HIGH (ref 5–40)

## 2019-09-30 LAB — VITAMIN D 25 HYDROXY (VIT D DEFICIENCY, FRACTURES): Vit D, 25-Hydroxy: 22.5 ng/mL — ABNORMAL LOW (ref 30.0–100.0)

## 2019-09-30 MED ORDER — VITAMIN D (ERGOCALCIFEROL) 1.25 MG (50000 UNIT) PO CAPS: 50000.0000 [IU] | ORAL_CAPSULE | ORAL | 3 refills | Status: AC

## 2019-10-07 ENCOUNTER — Other Ambulatory Visit: Payer: Self-pay | Admitting: Family Medicine

## 2019-10-19 ENCOUNTER — Ambulatory Visit: Payer: 59 | Attending: Internal Medicine

## 2019-10-19 DIAGNOSIS — Z23 Encounter for immunization: Secondary | ICD-10-CM

## 2019-10-19 NOTE — Progress Notes (Signed)
   Cov[facility] Vaccination [facility]  Name:  SINAI HAMIDEH    MRN: [id number] DOB: 1970-12-06  10/19/2019  Ms. Conk was observed post Covid-19 immunization for 15 minutes without incident. She was provided with Vaccine Information Sheet and instruction to access the V-Safe system.   Ms. Hibma was instructed to call 911 with any severe reactions post vaccine: Marland Kitchen Difficulty breathing  . Swelling of face and throat  . A fast heartbeat  . A bad rash all over body  . Dizziness and weakness   Immunizations Administered    Name Date Dose VIS Date Route   Pfizer COVID-19 Vaccine 10/19/2019  8:12 AM 0.3 mL 08/25/2018 Intramuscular   Manufacturer: [place]   Lot: [id number]   [place]: [id number]

## 2019-10-21 ENCOUNTER — Encounter: Payer: Self-pay | Admitting: Family Medicine

## 2019-10-22 NOTE — Telephone Encounter (Signed)
Pt has requested a new BG monitoring kit, requested it be the one touch. Could you write her a new Rx for that.

## 2019-10-24 ENCOUNTER — Other Ambulatory Visit: Payer: Self-pay | Admitting: Family Medicine

## 2019-10-29 ENCOUNTER — Other Ambulatory Visit: Payer: Self-pay

## 2019-10-29 ENCOUNTER — Ambulatory Visit (INDEPENDENT_AMBULATORY_CARE_PROVIDER_SITE_OTHER): Payer: 59 | Admitting: Adult Health Nurse Practitioner

## 2019-10-29 VITALS — BP 122/78 | HR 85 | Temp 98.0°F | Ht 62.0 in | Wt 200.6 lb

## 2019-10-29 DIAGNOSIS — E7800 Pure hypercholesterolemia, unspecified: Secondary | ICD-10-CM

## 2019-10-29 DIAGNOSIS — R030 Elevated blood-pressure reading, without diagnosis of hypertension: Secondary | ICD-10-CM | POA: Diagnosis not present

## 2019-10-29 DIAGNOSIS — Z720 Tobacco use: Secondary | ICD-10-CM | POA: Diagnosis not present

## 2019-10-29 DIAGNOSIS — E1129 Type 2 diabetes mellitus with other diabetic kidney complication: Secondary | ICD-10-CM | POA: Diagnosis not present

## 2019-10-29 DIAGNOSIS — R809 Proteinuria, unspecified: Secondary | ICD-10-CM

## 2019-10-29 DIAGNOSIS — I10 Essential (primary) hypertension: Secondary | ICD-10-CM

## 2019-10-29 DIAGNOSIS — R079 Chest pain, unspecified: Secondary | ICD-10-CM | POA: Diagnosis not present

## 2019-10-29 LAB — POCT URINALYSIS DIP (MANUAL ENTRY)
Bilirubin, UA: NEGATIVE
Glucose, UA: >=1000 mg/dL — AB
Leukocytes, UA: NEGATIVE
Nitrite, UA: NEGATIVE
Protein Ur, POC: >=300 mg/dL — AB
Spec Grav, UA: >=1.03 — AB (ref 1.010–1.025)
Urobilinogen, UA: 0.2 E.U./dL
pH, UA: 5.5 (ref 5.0–8.0)

## 2019-10-29 LAB — GLUCOSE, POCT (MANUAL RESULT ENTRY): POC Glucose: 164 mg/dl — AB (ref 70–99)

## 2019-10-29 NOTE — Patient Instructions (Signed)
° ° ° °  If you have lab work done today you will be contacted with your lab results within the next 2 weeks.  If you have not heard from us then please contact us. The fastest way to get your results is to register for My Chart. ° ° °IF you received an x-ray today, you will receive an invoice from [facility]. Please contact [facility] at [phone] with questions or concerns regarding your invoice.  ° °IF you received labwork today, you will receive an invoice from [facility]. Please contact [facility] at [phone] with questions or concerns regarding your invoice.  ° °Our billing staff will not be able to assist you with questions regarding bills from these companies. ° °You will be contacted with the lab results as soon as they are available. The fastest way to get your results is to activate your My Chart account. Instructions are located on the last page of this paperwork. If you have not heard from us regarding the results in 2 weeks, please contact this office. °  ° ° ° °

## 2019-10-30 LAB — MICROALBUMIN, URINE: Microalbumin, Urine: 799.6 ug/mL

## 2019-11-09 ENCOUNTER — Other Ambulatory Visit: Payer: Self-pay | Admitting: Adult Health Nurse Practitioner

## 2019-11-09 NOTE — Progress Notes (Signed)
History   Chief Complaint  Patient presents with  . Follow-up    x1 month on medical conditions     HPI   Patient is concerned.  She has had 2 episodes of chest pain, nonradiating that stopped her in her tracks.  She did not experience shortness of breath with these.  However she is concerned.  She continues to smoke.  Blood sugars are elevated.  We discussed adding glipizide and/or Trulicity but we do not know what her insurance coverage looks like for Trulicity.  She is concerned enough that she would appreciate a cardiac evaluation at this point.  In addition, she was counseled on stopping smoking and reports that she is not quite there yet.  We discussed how that can add to the chest pain and pressure.  Past Medical History:  Diagnosis Date  . Breast mass, left 07/2017  . Diabetes mellitus without complication ([place])   . Hyperlipidemia   . Migraines    Past Surgical History:  Procedure Laterality Date  . ANKLE SURGERY Left 10/15/2017  . BREAST BIOPSY Left 07/24/2017   Procedure: LEFT BREAST MASS EXCISIONAL BIOPSY;  Surgeon: Rolm Bookbinder, MD;  Location: [facility];  Service: General;  Laterality: Left;  . DEBULKING N/A 10/02/2017   Procedure: DEBULKING;  Surgeon: Isabel Caprice, MD;  [facility];  Service: Gynecology;  Laterality: N/A;  . FOOT GANGLION EXCISION Left    age 49  . HYSTERECTOMY ABDOMINAL WITH SALPINGO-OOPHORECTOMY Bilateral 10/02/2017   Procedure: HYSTERECTOMY ABDOMINAL WITH SALPINGO-OOPHORECTOMY;  Surgeon: Isabel Caprice, MD;  [facility];  Service: Gynecology;  Laterality: Bilateral;  bilateral salpingo-oophorectomy with possible total abdominal hysterectomy  . LAPAROTOMY WITH STAGING N/A 10/02/2017   Procedure: LAPAROTOMY WITH STAGING/OMENTECTOMY;  Surgeon: Isabel Caprice, MD;  [facility];  Service: Gynecology;  Laterality: N/A;  . RE-EXCISION OF BREAST LUMPECTOMY Left 08/25/2017   Procedure: LEFT RE-EXCISION OF BREAST MARGINS  ERAS PATHWAY;  Surgeon: Rolm Bookbinder, MD;  Location: [facility];  Service: General;  Laterality: Left;   Family History  Problem Relation Age of Onset  . Hyperlipidemia Mother   . Alcohol abuse Mother   . Cirrhosis Mother   . Diabetes Father   . Hyperlipidemia Father   . Hypertension Father   . Stroke Father   . Heart disease Father 17       CAD with stenting multiple  . Hyperlipidemia Brother   . Hypertension Brother   . Diabetes Sister   . Hyperlipidemia Sister   . Hypertension Sister   . Cancer Sister        Stomach  . Diabetes Sister   . Hyperlipidemia Sister   . Hypertension Sister   . Breast cancer Maternal Grandmother        Post menopausal  . Pancreatic cancer Paternal Aunt   . Pancreatic cancer Paternal Aunt    Social History   Tobacco Use  . Smoking status: Current Every Day Smoker    Packs/day: 0.50    Years: 30.00    Pack years: 15.00    Types: Cigarettes  . Smokeless tobacco: Never Used  . Tobacco comment: 1 daily  Substance Use Topics  . Alcohol use: No  . Drug use: No   OB History    Gravida  0   Para  0   Term  0   Preterm  0   AB  0   Living  0     SAB  0  TAB  0   Ectopic  0   Multiple  0   Live Births  0          GAD 7 : Generalized Anxiety Score 10/29/2019 09/29/2019 08/26/2019  Nervous, Anxious, on Edge 0 0 0  Control/stop worrying 0 0 0  Worry too much - different things 0 0 0  Trouble relaxing 0 0 0  Restless 0 0 0  Easily annoyed or irritable 0 0 0  Afraid - awful might happen 0 0 0  Total GAD 7 Score 0 0 0  Anxiety Difficulty Not difficult at all Not difficult at all -    PHQ9 SCORE ONLY 10/29/2019 09/29/2019 08/26/2019  Score 0 0 0     Review of Systems  Review of Systems  Constitutional: Negative for activity change, appetite change, chills and fever.  HENT: Negative for congestion, nosebleeds, trouble swallowing and voice change.   Respiratory: Negative for cough, shortness of breath  and wheezing.   Cardiac:  Positive for chest pain  Gastrointestinal: Negative for diarrhea, nausea and vomiting.  Genitourinary: Negative for difficulty urinating, dysuria, flank pain and hematuria.  Musculoskeletal: Negative for back pain, joint swelling and neck pain.  Neurological: Negative for dizziness, speech difficulty, light-headedness and numbness.  See HPI. All other review of systems negative.    Allergies   Percocet [oxycodone-acetaminophen]  Home Medications    Current Outpatient Medications:  .  atorvastatin (LIPITOR) 40 MG tablet, Take 1 tablet (40 mg total) by mouth daily., Disp: 90 tablet, Rfl: 3 .  busPIRone (BUSPAR) 10 MG tablet, Take 1 tablet (10 mg total) by mouth 3 (three) times daily., Disp: 270 tablet, Rfl: 0 .  dapagliflozin propanediol (FARXIGA) 10 MG TABS tablet, Take 10 mg by mouth daily before breakfast., Disp: 90 tablet, Rfl: 1 .  estradiol (ESTRACE) 1 MG tablet, Take 1 tablet (1 mg total) by mouth daily., Disp: 90 tablet, Rfl: 4 .  gabapentin (NEURONTIN) 600 MG tablet, Take 1 tablet (600 mg total) by mouth 3 (three) times daily., Disp: 90 tablet, Rfl: 3 .  ibuprofen (ADVIL) 800 MG tablet, Take 1 tablet (800 mg total) by mouth every 8 (eight) hours as needed., Disp: 90 tablet, Rfl: 3 .  losartan-hydrochlorothiazide (HYZAAR) 50-12.5 MG tablet, Take 1 tablet by mouth daily., Disp: 90 tablet, Rfl: 3 .  metFORMIN (GLUCOPHAGE) 1000 MG tablet, TAKE 1 TABLET BY MOUTH TWICE A DAY WITH MEALS, Disp: 180 tablet, Rfl: 1 .  venlafaxine XR (EFFEXOR XR) 75 MG 24 hr capsule, Take 1 capsule (75 mg total) by mouth daily with breakfast., Disp: 30 capsule, Rfl: 3 .  cyclobenzaprine (FLEXERIL) 10 MG tablet, Take 1 tablet (10 mg total) by mouth at bedtime as needed for muscle spasms., Disp: 60 tablet, Rfl: 1 .  diclofenac (VOLTAREN) 75 MG EC tablet, Take 1 tablet (75 mg total) by mouth 2 (two) times daily., Disp: 180 tablet, Rfl: 0 .  venlafaxine XR (EFFEXOR-XR) 150 MG 24 hr  capsule, TAKE 1 CAPSULE (150 MG TOTAL) BY MOUTH DAILY WITH BREAKFAST., Disp: 90 capsule, Rfl: 0  Meds Ordered and Administered this Visit  No orders of the defined types were placed in this encounter.   BP 122/78 (BP Location: Right Arm, Patient Position: Sitting, Cuff Size: Large)   Pulse 85   Temp 98 F (36.7 C) (Temporal)   Ht 5\' 2"  (1.575 m)   Wt 200 lb 9.6 oz (91 kg)   LMP 09/21/2017 (Exact Date)   SpO2 97%  BMI 36.69 kg/m   Physical Exam   General appearance: alert, well appearing, and in no distress, oriented to person, place, and time and anxious. Chest: clear to auscultation, no wheezes, rales or rhonchi, symmetric air entry.  CVS exam: normal rate, regular rhythm, normal S1, S2, no murmurs, rubs, clicks or gallops. Skin exam - normal coloration and turgor, no rashes, no suspicious skin lesions noted. Mental Status: normal mood, behavior, speech, dress, motor activity, and thought processes, anxious.   MDM   1. Chest pain, unspecified type   2. Type 2 diabetes mellitus with microalbuminuria, without long-term current use of insulin (HCC)   3. Blood pressure elevated without history of HTN   4. Tobacco abuse   5. Pure hypercholesterolemia   6. Essential hypertension

## 2019-11-10 ENCOUNTER — Other Ambulatory Visit: Payer: Self-pay | Admitting: Adult Health Nurse Practitioner

## 2019-11-10 MED ORDER — TRULICITY 0.75 MG/0.5ML ~~LOC~~ SOAJ
0.7500 mg | SUBCUTANEOUS | 6 refills | Status: DC
Start: 2019-11-10 — End: 2019-11-25

## 2019-11-10 MED ORDER — GLIPIZIDE 5 MG PO TABS
5.0000 mg | ORAL_TABLET | Freq: Two times a day (BID) | ORAL | 3 refills | Status: DC
Start: 2019-11-10 — End: 2020-01-26

## 2019-11-10 MED ORDER — VITAMIN D (ERGOCALCIFEROL) 1.25 MG (50000 UNIT) PO CAPS
50000.0000 [IU] | ORAL_CAPSULE | ORAL | 3 refills | Status: AC
Start: 2019-11-10 — End: 2019-12-10

## 2019-11-10 NOTE — Progress Notes (Signed)
Reviewed blood work with patient on phone.  We are going to start Glipizide in the interim to see what insurance coverage looks like on Trulicity.   Will treat Vitamin D deficiency with prescription as well.   She has changed her medical care to a different practice.    Glyn Ade, NP

## 2019-11-11 ENCOUNTER — Other Ambulatory Visit: Payer: Self-pay | Admitting: Family Medicine

## 2019-11-19 ENCOUNTER — Ambulatory Visit: Payer: 59 | Admitting: Adult Health Nurse Practitioner

## 2019-11-23 ENCOUNTER — Other Ambulatory Visit: Payer: Self-pay | Admitting: Family Medicine

## 2019-11-25 ENCOUNTER — Other Ambulatory Visit: Payer: Self-pay

## 2019-11-25 ENCOUNTER — Ambulatory Visit (INDEPENDENT_AMBULATORY_CARE_PROVIDER_SITE_OTHER): Payer: 59 | Admitting: Cardiovascular Disease

## 2019-11-25 ENCOUNTER — Encounter: Payer: Self-pay | Admitting: Family Medicine

## 2019-11-25 ENCOUNTER — Encounter: Payer: Self-pay | Admitting: Cardiovascular Disease

## 2019-11-25 VITALS — BP 110/64 | HR 60 | Ht 62.0 in | Wt 200.8 lb

## 2019-11-25 DIAGNOSIS — R079 Chest pain, unspecified: Secondary | ICD-10-CM | POA: Diagnosis not present

## 2019-11-25 DIAGNOSIS — R072 Precordial pain: Secondary | ICD-10-CM

## 2019-11-25 DIAGNOSIS — Z01812 Encounter for preprocedural laboratory examination: Secondary | ICD-10-CM | POA: Diagnosis not present

## 2019-11-25 LAB — BASIC METABOLIC PANEL
BUN/Creatinine Ratio: 30 — ABNORMAL HIGH (ref 9–23)
BUN: 18 mg/dL (ref 6–24)
CO2: 23 mmol/L (ref 20–29)
Calcium: 10.1 mg/dL (ref 8.7–10.2)
Chloride: 101 mmol/L (ref 96–106)
Creatinine, Ser: 0.6 mg/dL (ref 0.57–1.00)
GFR calc Af Amer: 126 mL/min/{1.73_m2} (ref >59)
GFR calc non Af Amer: 109 mL/min/{1.73_m2} (ref >59)
Glucose: 127 mg/dL — ABNORMAL HIGH (ref 65–99)
Potassium: 3.9 mmol/L (ref 3.5–5.2)
Sodium: 139 mmol/L (ref 134–144)

## 2019-11-25 MED ORDER — METOPROLOL TARTRATE 100 MG PO TABS
ORAL_TABLET | ORAL | 0 refills | Status: DC
Start: 2019-11-25 — End: 2021-08-27

## 2019-11-25 NOTE — Patient Instructions (Addendum)
Medication Instructions:  No changes *If you need a refill on your cardiac medications before your next appointment, please call your pharmacy*   Lab Work: bmet - today If you have labs (blood work) drawn today and your tests are completely normal, you will receive your results only by: Marland Kitchen MyChart Message (if you have MyChart) OR . A paper copy in the mail If you have any lab test that is abnormal or we need to change your treatment, we will call you to review the results.   Testing/Procedures: Your physician has requested that you have an echocardiogram. Echocardiography is a painless test that uses sound waves to create images of your heart. It provides your doctor with information about the size and shape of your heart and how well your heart's chambers and valves are working. This procedure takes approximately one hour. There are no restrictions for this procedure.  Your physician has requested that you have cardiac CT. Cardiac computed tomography (CT) is a painless test that uses an x-ray machine to take clear, detailed pictures of your heart. For further information please visit [email or web address]. Please follow instruction sheet as given.   Follow-Up: At [facility], you and your health needs are our priority.  As part of our continuing mission to provide you with exceptional heart care, we have created designated Provider Care Teams.  These Care Teams include your primary Cardiologist (physician) and Advanced Practice Providers (APPs -  Physician Assistants and Nurse Practitioners) who all work together to provide you with the care you need, when you need it.  Your next appointment:   4-5 week(s)  The format for your next appointment:   In Person  Provider:   You may see Dr. Angelena Form or one of the following Advanced Practice Providers on your designated Care Team:    Melina Copa, PA-C  Ermalinda Barrios, PA-C   Other Instructions:  [facility] [address] [place],  [address] [phone]  Please arrive at the [facility] main entrance of [facility] 30 minutes prior to test start time. Proceed to the [facility] Radiology Department (first floor) to check-in and test prep.   Please follow these instructions carefully (unless otherwise directed):   On the Night Before the Test: . Be sure to Drink plenty of water. . Do not consume any caffeinated/decaffeinated beverages or chocolate 12 hours prior to your test. . Do not take any antihistamines 12 hours prior to your test. . If you take Metformin do not take 24 hours prior to test.  On the Day of the Test: . Drink plenty of water. Do not drink any water within one hour of the test. . Do not eat any food 4 hours prior to the test. . You may take your regular medications prior to the test.  . HOLD losartan-hydrochlorothiazide the morning of the test. . FEMALES- please wear underwire-free bra if available       After the Test: . Drink plenty of water. . After receiving IV contrast, you may experience a mild flushed feeling. This is normal. . On occasion, you may experience a mild rash up to 24 hours after the test. This is not dangerous. If this occurs, you can take Benadryl 25 mg and increase your fluid intake. . If you experience trouble breathing, this can be serious. If it is severe call 911 IMMEDIATELY. If it is mild, please call our office. . If you take any of these medications: Glipizide/Metformin, Avandament, Glucavance, please  do not take 48 hours after completing test unless otherwise instructed.   Once we have confirmed authorization from your insurance company, we will call you to set up a date and time for your test.   For non-scheduling related questions, please contact the cardiac imaging nurse navigator should you have any questions/concerns: Marchia Bond, Cardiac Imaging Nurse Navigator Burley Saver, Interim Cardiac Imaging Nurse [facility] Direct Office Dial: [phone]   For scheduling needs, including cancellations and rescheduling, please call [phone].

## 2019-11-25 NOTE — Progress Notes (Signed)
Chief Complaint  Patient presents with  . New Patient (Initial Visit)    Chest pain    History of Present Illness: 49 yo female with history of DM, HTN, HLD and tobacco abuse who is here today as a new patient for the evaluation of chest pain. She has no prior cardiac issues. She has a strong FH of CAD. She reports having chest tightness after vomiting last month. She has sharp pains in both arms. She has dizziness. She has some exertional chest pain with associated dyspnea. She is a long time smoker, now smoking 4 cigarettes per day.   Primary Care Physician: Wendall Mola, NP   Past Medical History:  Diagnosis Date  . Ankle mass, left 07/30/2017  . Blood pressure elevated without history of HTN 07/30/2017  . Breast mass, left 07/2017  . Breast mass, right 07/30/2017  . Class 1 obesity due to excess calories with serious comorbidity and body mass index (BMI) of 33.0 to 33.9 in adult 07/30/2017  . Diabetes mellitus without complication ([place])   . Essential hypertension 06/21/2019  . Hematuria, microscopic 07/30/2017  . Hot flashes due to surgical menopause 12/28/2017  . Hyperlipidemia   . Menometrorrhagia 07/30/2017  . Migraines   . Pure hypercholesterolemia 07/30/2017  . Tobacco abuse 07/30/2017  . Type 2 diabetes mellitus with microalbuminuria, without long-term current use of insulin ([place]) 07/30/2017  . Uncontrolled type 2 diabetes mellitus with hyperglycemia ([place]) 08/26/2019  . Vaginal discharge 08/26/2019    Past Surgical History:  Procedure Laterality Date  . ANKLE SURGERY Left 10/15/2017  . BREAST BIOPSY Left 07/24/2017   Procedure: LEFT BREAST MASS EXCISIONAL BIOPSY;  Surgeon: Rolm Bookbinder, MD;  Location: [facility];  Service: General;  Laterality: Left;  . DEBULKING N/A 10/02/2017   Procedure: DEBULKING;  Surgeon: Isabel Caprice, MD;  [facility];  Service: Gynecology;  Laterality: N/A;  . FOOT GANGLION EXCISION Left    age 52  . HYSTERECTOMY  ABDOMINAL WITH SALPINGO-OOPHORECTOMY Bilateral 10/02/2017   Procedure: HYSTERECTOMY ABDOMINAL WITH SALPINGO-OOPHORECTOMY;  Surgeon: Isabel Caprice, MD;  [facility];  Service: Gynecology;  Laterality: Bilateral;  bilateral salpingo-oophorectomy with possible total abdominal hysterectomy  . LAPAROTOMY WITH STAGING N/A 10/02/2017   Procedure: LAPAROTOMY WITH STAGING/OMENTECTOMY;  Surgeon: Isabel Caprice, MD;  [facility];  Service: Gynecology;  Laterality: N/A;  . RE-EXCISION OF BREAST LUMPECTOMY Left 08/25/2017   Procedure: LEFT RE-EXCISION OF BREAST MARGINS ERAS PATHWAY;  Surgeon: Rolm Bookbinder, MD;  Location: [facility];  Service: General;  Laterality: Left;    Current Outpatient Medications  Medication Sig Dispense Refill  . atorvastatin (LIPITOR) 40 MG tablet TAKE 1 TABLET BY MOUTH EVERY DAY 60 tablet 0  . busPIRone (BUSPAR) 10 MG tablet Take 1 tablet (10 mg total) by mouth 3 (three) times daily. 270 tablet 0  . dapagliflozin propanediol (FARXIGA) 10 MG TABS tablet Take 10 mg by mouth daily before breakfast. 90 tablet 1  . estradiol (ESTRACE) 1 MG tablet Take 1 tablet (1 mg total) by mouth daily. 90 tablet 4  . gabapentin (NEURONTIN) 600 MG tablet Take 1 tablet (600 mg total) by mouth 3 (three) times daily. 90 tablet 3  . glipiZIDE (GLUCOTROL) 5 MG tablet Take 1 tablet (5 mg total) by mouth 2 (two) times daily before a meal. 60 tablet 3  . ibuprofen (ADVIL) 800 MG tablet Take 1 tablet (800 mg total) by mouth every 8 (eight) hours as needed. [place]  tablet 3  . losartan-hydrochlorothiazide (HYZAAR) 50-12.5 MG tablet Take 1 tablet by mouth daily. 90 tablet 3  . metFORMIN (GLUCOPHAGE) 1000 MG tablet TAKE 1 TABLET BY MOUTH TWICE A DAY WITH MEALS 180 tablet 1  . Vitamin D, Ergocalciferol, (DRISDOL) 1.25 MG (50000 UNIT) CAPS capsule Take 1 capsule (50,000 Units total) by mouth every 7 (seven) days. 5 capsule 3  . metoprolol tartrate (LOPRESSOR) 100 MG tablet Take one tablet  by mouth 2 hours before ct scan 1 tablet 0  . venlafaxine XR (EFFEXOR XR) 75 MG 24 hr capsule Take 1 capsule (75 mg total) by mouth daily with breakfast. 30 capsule 3   No current facility-administered medications for this visit.    Allergies  Allergen Reactions  . Percocet [Oxycodone-Acetaminophen] Nausea And Vomiting    Social History   Socioeconomic History  . Marital status: Single    Spouse name: Not on file  . Number of children: 0  . Years of education: Not on file  . Highest education level: Not on file  Occupational History  . Occupation: She works at [employer]  . Smoking status: Current Every Day Smoker    Packs/day: 0.25    Years: 30.00    Pack years: 7.50    Types: Cigarettes  . Smokeless tobacco: Never Used  . Tobacco comment: 1 daily  Substance and Sexual Activity  . Alcohol use: No  . Drug use: No  . Sexual activity: Not Currently    Comment: 1st intercourse 49 yo-More than 5 partners  Other Topics Concern  . Not on file  Social History Narrative   Marital status: single; girlfriend x 3 years; from [place]; moved [place] in 2015      Children: none      Lives: with girlfriend      Employment: works at [employer]; runs seasonal department      Tobacco: 1.5ppd x age 38.  Never quit.      Alcohol: None; rare      Drugs: week as teenager      Exercise: none      Sexual activity: females since 74; males prior.  One STD/Gonorrhea.   Social Determinants of Health   Financial Resource Strain:   . Difficulty of Paying Living Expenses:   Food Insecurity:   . Worried About Charity fundraiser in the Last Year:   . Arboriculturist in the Last Year:   Transportation Needs:   . Film/video editor (Medical):   Marland Kitchen Lack of Transportation (Non-Medical):   Physical Activity:   . Days of Exercise per Week:   . Minutes of Exercise per Session:   Stress:   . Feeling of Stress :   Social Connections:   . Frequency of Communication with Friends and  Family:   . Frequency of Social Gatherings with Friends and Family:   . Attends Religious Services:   . Active Member of Clubs or Organizations:   . Attends Archivist Meetings:   Marland Kitchen Marital Status:   Intimate Partner Violence:   . Fear of Current or Ex-Partner:   . Emotionally Abused:   Marland Kitchen Physically Abused:   . Sexually Abused:     Family History  Problem Relation Age of Onset  . Hyperlipidemia Mother   . Alcohol abuse Mother   . Cirrhosis Mother   . Diabetes Father   . Hyperlipidemia Father   . Hypertension Father   . Stroke Father   .  Heart disease Father 81       CAD with stenting multiple  . Hyperlipidemia Brother   . Hypertension Brother   . Diabetes Sister   . Hyperlipidemia Sister   . Hypertension Sister   . Cancer Sister        Stomach  . Diabetes Sister   . Hyperlipidemia Sister   . Hypertension Sister   . Breast cancer Maternal Grandmother        Post menopausal  . Pancreatic cancer Paternal Aunt   . Pancreatic cancer Paternal Aunt     Review of Systems:  As stated in the HPI and otherwise negative.   BP 110/64   Pulse 60   Ht 5\' 2"  (1.575 m)   Wt 200 lb 12.8 oz (91.1 kg)   LMP 09/21/2017 (Exact Date)   SpO2 97%   BMI 36.73 kg/m   Physical Examination: General: Well developed, well nourished, NAD  HEENT: OP clear, mucus membranes moist  SKIN: warm, dry. No rashes. Neuro: No focal deficits  Musculoskeletal: Muscle strength 5/5 all ext  Psychiatric: Mood and affect normal  Neck: No JVD, no carotid bruits, no thyromegaly, no lymphadenopathy.  Lungs:Clear bilaterally, no wheezes, rhonci, crackles Cardiovascular: Regular rate and rhythm. No murmurs, gallops or rubs. Abdomen:Soft. Bowel sounds present. Non-tender.  Extremities: No lower extremity edema. Pulses are 2 + in the bilateral DP/PT.  EKG:  EKG is ordered today. The ekg ordered today demonstrates sinus  Recent Labs: 06/21/2019: TSH 1.680 09/29/2019: ALT 31; BUN 16; Creatinine,  Ser 0.63; Potassium 4.1; Sodium 139   Lipid Panel    Component Value Date/Time   CHOL 206 (H) 09/29/2019 0918   TRIG 331 (H) 09/29/2019 0918   HDL 47 09/29/2019 0918   CHOLHDL 4.4 09/29/2019 0918   LDLCALC 103 (H) 09/29/2019 0918     Wt Readings from Last 3 Encounters:  11/25/19 200 lb 12.8 oz (91.1 kg)  10/29/19 200 lb 9.6 oz (91 kg)  09/29/19 202 lb 6.4 oz (91.8 kg)      Assessment and Plan:   1.  Chest pain: Risk factors for CAD include DM, HTN, HLD, tobacco abuse, obesity and FH of CAD. Chest pain typical and atypical. Will arrange a cardiac CTA to exclude CAD. Echo to assess LV size and function.;   Current medicines are reviewed at length with the patient today.  The patient does not have concerns regarding medicines.  The following changes have been made:  no change  Labs/ tests ordered today include:   Orders Placed This Encounter  Procedures  . CT CORONARY MORPH W/CTA COR W/SCORE W/CA W/CM &/OR WO/CM  . CT CORONARY FRACTIONAL FLOW RESERVE DATA PREP  . CT CORONARY FRACTIONAL FLOW RESERVE FLUID ANALYSIS  . Basic metabolic panel  . ECHOCARDIOGRAM COMPLETE     Disposition:   FU with me in 4 weeks.    Signed, Lauree Chandler, MD 11/25/2019 9:49 AM    [facility] [address], [place], [place]  [address] Phone: [phone]; Fax: [phone]

## 2019-11-30 NOTE — Addendum Note (Signed)
Addended by: Mendel Ryder on: 11/30/2019 07:59 AM   Modules accepted: Orders

## 2019-12-04 ENCOUNTER — Other Ambulatory Visit: Payer: Self-pay | Admitting: Family Medicine

## 2019-12-08 ENCOUNTER — Encounter: Payer: Self-pay | Admitting: Family Medicine

## 2019-12-15 NOTE — Telephone Encounter (Signed)
Called patient.  Her cardiac cta is not scheduled yet.  Tomorrow is her echo.  Instructions are in my chart on her AVS for ct.   Will send message to scheduler for update on when her CT may be scheduled.

## 2019-12-16 ENCOUNTER — Ambulatory Visit (HOSPITAL_COMMUNITY): Payer: 59 | Attending: Cardiovascular Disease

## 2019-12-16 ENCOUNTER — Other Ambulatory Visit: Payer: Self-pay

## 2019-12-16 DIAGNOSIS — R072 Precordial pain: Secondary | ICD-10-CM

## 2019-12-16 DIAGNOSIS — R079 Chest pain, unspecified: Secondary | ICD-10-CM

## 2019-12-17 ENCOUNTER — Telehealth (HOSPITAL_COMMUNITY): Payer: Self-pay | Admitting: *Deleted

## 2019-12-17 ENCOUNTER — Other Ambulatory Visit: Payer: Self-pay | Admitting: Family Medicine

## 2019-12-17 NOTE — Telephone Encounter (Signed)
Attempted to call patient regarding upcoming cardiac CT appointment. Left message on voicemail with name and callback number  Merle Tai RN Navigator Cardiac Imaging [facility] [phone] Office [phone] Cell 

## 2019-12-20 ENCOUNTER — Encounter (HOSPITAL_COMMUNITY): Payer: Self-pay

## 2019-12-20 ENCOUNTER — Other Ambulatory Visit: Payer: Self-pay

## 2019-12-20 ENCOUNTER — Ambulatory Visit (HOSPITAL_COMMUNITY)
Admission: RE | Admit: 2019-12-20 | Discharge: 2019-12-20 | Disposition: A | Discharge status: Home / Self Care | Payer: 59 | Source: Ambulatory Visit | Attending: Cardiovascular Disease | Admitting: Cardiovascular Disease

## 2019-12-20 DIAGNOSIS — R072 Precordial pain: Secondary | ICD-10-CM | POA: Insufficient documentation

## 2019-12-20 MED ORDER — NITROGLYCERIN 0.4 MG SL SUBL
SUBLINGUAL_TABLET | SUBLINGUAL | Status: AC
  Administered 2019-12-20: 0.8 mg via SUBLINGUAL
  Filled 2019-12-20: qty 2

## 2019-12-20 MED ORDER — NITROGLYCERIN 0.4 MG SL SUBL: 0.8000 mg | SUBLINGUAL_TABLET | Freq: Once | SUBLINGUAL | Status: AC

## 2019-12-20 MED ORDER — IOHEXOL 350 MG/ML SOLN
80.0000 mL | Freq: Once | INTRAVENOUS | Status: AC | PRN
  Administered 2019-12-20: 80 mL via INTRAVENOUS

## 2019-12-29 ENCOUNTER — Ambulatory Visit: Payer: 59 | Admitting: Cardiovascular Disease

## 2019-12-29 NOTE — Progress Notes (Deleted)
No chief complaint on file.  History of Present Illness: 49 yo female with history of DM, HTN, HLD and tobacco abuse who is here today for cardiac follow up. I saw her as a new patient for the evaluation of chest pain on 11/25/19. She has no prior cardiac issues. She has a strong FH of CAD. She reported having chest tightness after vomiting last month. She had sharp pains in both arms and associated dizziness with report of some exertional chest pain with associated dyspnea. She is a long time smoker, now smoking 4 cigarettes per day. Echo 12/16/19 with LVEF=55-60%. Mild MR. Coronary CTA 12/20/19 with calcium score of zero and no evidence of CAD.   She is here today for follow up. The patient denies any chest pain, dyspnea, palpitations, lower extremity edema, orthopnea, PND, dizziness, near syncope or syncope.    Primary Care Physician: Wendall Mola, NP   Past Medical History:  Diagnosis Date  . Ankle mass, left 07/30/2017  . Blood pressure elevated without history of HTN 07/30/2017  . Breast mass, left 07/2017  . Breast mass, right 07/30/2017  . Class 1 obesity due to excess calories with serious comorbidity and body mass index (BMI) of 33.0 to 33.9 in adult 07/30/2017  . Diabetes mellitus without complication ([place])   . Essential hypertension 06/21/2019  . Hematuria, microscopic 07/30/2017  . Hot flashes due to surgical menopause 12/28/2017  . Hyperlipidemia   . Menometrorrhagia 07/30/2017  . Migraines   . Pure hypercholesterolemia 07/30/2017  . Tobacco abuse 07/30/2017  . Type 2 diabetes mellitus with microalbuminuria, without long-term current use of insulin ([place]) 07/30/2017  . Uncontrolled type 2 diabetes mellitus with hyperglycemia ([place]) 08/26/2019  . Vaginal discharge 08/26/2019    Past Surgical History:  Procedure Laterality Date  . ANKLE SURGERY Left 10/15/2017  . BREAST BIOPSY Left 07/24/2017   Procedure: LEFT BREAST MASS EXCISIONAL BIOPSY;  Surgeon: Rolm Bookbinder, MD;   Location: [facility];  Service: General;  Laterality: Left;  . DEBULKING N/A 10/02/2017   Procedure: DEBULKING;  Surgeon: Isabel Caprice, MD;  Location: [facility];  Service: Gynecology;  Laterality: N/A;  . FOOT GANGLION EXCISION Left    age 23  . HYSTERECTOMY ABDOMINAL WITH SALPINGO-OOPHORECTOMY Bilateral 10/02/2017   Procedure: HYSTERECTOMY ABDOMINAL WITH SALPINGO-OOPHORECTOMY;  Surgeon: Isabel Caprice, MD;  Location[facility];  Service: Gynecology;  Laterality: Bilateral;  bilateral salpingo-oophorectomy with possible total abdominal hysterectomy  . LAPAROTOMY WITH STAGING N/A 10/02/2017   Procedure: LAPAROTOMY WITH STAGING/OMENTECTOMY;  Surgeon: Isabel Caprice, MD;  [facility];  Service: Gynecology;  Laterality: N/A;  . RE-EXCISION OF BREAST LUMPECTOMY Left 08/25/2017   Procedure: LEFT RE-EXCISION OF BREAST MARGINS ERAS PATHWAY;  Surgeon: Rolm Bookbinder, MD;  Location: [facility];  Service: General;  Laterality: Left;    Current Outpatient Medications  Medication Sig Dispense Refill  . atorvastatin (LIPITOR) 40 MG tablet TAKE 1 TABLET BY MOUTH EVERY DAY 60 tablet 0  . estradiol (ESTRACE) 1 MG tablet Take 1 tablet (1 mg total) by mouth daily. 90 tablet 4  . FARXIGA 10 MG TABS tablet TAKE 10 MG BY MOUTH DAILY BEFORE BREAKFAST. 90 tablet 0  . gabapentin (NEURONTIN) 600 MG tablet Take 1 tablet (600 mg total) by mouth 3 (three) times daily. 90 tablet 3  . glipiZIDE (GLUCOTROL) 5 MG tablet Take 1 tablet (5 mg total) by mouth 2 (two) times daily before a meal. 60 tablet 3  . ibuprofen (  ADVIL) 800 MG tablet Take 1 tablet (800 mg total) by mouth every 8 (eight) hours as needed. 90 tablet 3  . losartan-hydrochlorothiazide (HYZAAR) 50-12.5 MG tablet Take 1 tablet by mouth daily. 90 tablet 3  . metFORMIN (GLUCOPHAGE) 1000 MG tablet TAKE 1 TABLET BY MOUTH TWICE A DAY WITH MEALS 180 tablet 1  . metoprolol tartrate (LOPRESSOR) 100 MG tablet Take one tablet by mouth 2  hours before ct scan (Patient not taking: Reported on 12/20/2019) 1 tablet 0  . venlafaxine XR (EFFEXOR XR) 75 MG 24 hr capsule Take 1 capsule (75 mg total) by mouth daily with breakfast. 30 capsule 3   No current facility-administered medications for this visit.    Allergies  Allergen Reactions  . Percocet [Oxycodone-Acetaminophen] Nausea And Vomiting    Social History   Socioeconomic History  . Marital status: Single    Spouse name: Not on file  . Number of children: 0  . Years of education: Not on file  . Highest education level: Not on file  Occupational History  . Occupation: She works at [employer]  . Smoking status: Current Every Day Smoker    Packs/day: 0.25    Years: 30.00    Pack years: 7.50    Types: Cigarettes  . Smokeless tobacco: Never Used  . Tobacco comment: 1 daily  Vaping Use  . Vaping Use: Never used  Substance and Sexual Activity  . Alcohol use: No  . Drug use: No  . Sexual activity: Not Currently    Comment: 1st intercourse 49 yo-More than 5 partners  Other Topics Concern  . Not on file  Social History Narrative   Marital status: single; girlfriend x 3 years; from [place]; moved [place] in 2015      Children: none      Lives: with girlfriend      Employment: works at [employer]; runs seasonal department      Tobacco: 1.5ppd x age 23.  Never quit.      Alcohol: None; rare      Drugs: week as teenager      Exercise: none      Sexual activity: females since 80; males prior.  One STD/Gonorrhea.   Social Determinants of Health   Financial Resource Strain:   . Difficulty of Paying Living Expenses:   Food Insecurity:   . Worried About Charity fundraiser in the Last Year:   . Arboriculturist in the Last Year:   Transportation Needs:   . Film/video editor (Medical):   Marland Kitchen Lack of Transportation (Non-Medical):   Physical Activity:   . Days of Exercise per Week:   . Minutes of Exercise per Session:   Stress:   . Feeling of Stress :     Social Connections:   . Frequency of Communication with Friends and Family:   . Frequency of Social Gatherings with Friends and Family:   . Attends Religious Services:   . Active Member of Clubs or Organizations:   . Attends Archivist Meetings:   Marland Kitchen Marital Status:   Intimate Partner Violence:   . Fear of Current or Ex-Partner:   . Emotionally Abused:   Marland Kitchen Physically Abused:   . Sexually Abused:     Family History  Problem Relation Age of Onset  . Hyperlipidemia Mother   . Alcohol abuse Mother   . Cirrhosis Mother   . Diabetes Father   . Hyperlipidemia Father   . Hypertension Father   .  Stroke Father   . Heart disease Father 14       CAD with stenting multiple  . Hyperlipidemia Brother   . Hypertension Brother   . Diabetes Sister   . Hyperlipidemia Sister   . Hypertension Sister   . Cancer Sister        Stomach  . Diabetes Sister   . Hyperlipidemia Sister   . Hypertension Sister   . Breast cancer Maternal Grandmother        Post menopausal  . Pancreatic cancer Paternal Aunt   . Pancreatic cancer Paternal Aunt     Review of Systems:  As stated in the HPI and otherwise negative.   LMP 09/21/2017 (Exact Date)   Physical Examination: General: Well developed, well nourished, NAD  HEENT: OP clear, mucus membranes moist  SKIN: warm, dry. No rashes. Neuro: No focal deficits  Musculoskeletal: Muscle strength 5/5 all ext  Psychiatric: Mood and affect normal  Neck: No JVD, no carotid bruits, no thyromegaly, no lymphadenopathy.  Lungs:Clear bilaterally, no wheezes, rhonci, crackles Cardiovascular: Regular rate and rhythm. No murmurs, gallops or rubs. Abdomen:Soft. Bowel sounds present. Non-tender.  Extremities: No lower extremity edema. Pulses are 2 + in the bilateral DP/PT.  EKG:  EKG is not *** ordered today. The ekg ordered today demonstrates   Recent Labs: 06/21/2019: TSH 1.680 09/29/2019: ALT 31 11/25/2019: BUN 18; Creatinine, Ser 0.60; Potassium 3.9;  Sodium 139   Lipid Panel    Component Value Date/Time   CHOL 206 (H) 09/29/2019 0918   TRIG 331 (H) 09/29/2019 0918   HDL 47 09/29/2019 0918   CHOLHDL 4.4 09/29/2019 0918   LDLCALC 103 (H) 09/29/2019 0918     Wt Readings from Last 3 Encounters:  11/25/19 200 lb 12.8 oz (91.1 kg)  10/29/19 200 lb 9.6 oz (91 kg)  09/29/19 202 lb 6.4 oz (91.8 kg)      Assessment and Plan:   1.  Chest pain: Echo with normal LV systolic function. No evidence of CAD on coronary CTA. Her chest pain is not felt to be cardiac related. No further ischemic workup.   2. Mitral regurgitation: Mild by echo June 2021. Repeat echo in 2-3 years.   3. Tobacco abuse: Cessation recommended.   Current medicines are reviewed at length with the patient today.  The patient does not have concerns regarding medicines.  The following changes have been made:  no change  Labs/ tests ordered today include:   No orders of the defined types were placed in this encounter.    Disposition:   FU with me in 4 weeks.    Signed, Lauree Chandler, MD 12/29/2019 6:30 AM    [facility] [place], [place], [place]  [address] Phone: [phone]; Fax: [phone]

## 2020-01-12 ENCOUNTER — Other Ambulatory Visit: Payer: Self-pay | Admitting: Internal Medicine

## 2020-01-12 DIAGNOSIS — Z1231 Encounter for screening mammogram for malignant neoplasm of breast: Secondary | ICD-10-CM

## 2020-01-12 DIAGNOSIS — N6310 Unspecified lump in the right breast, unspecified quadrant: Secondary | ICD-10-CM

## 2020-01-19 ENCOUNTER — Other Ambulatory Visit: Payer: Self-pay | Admitting: Internal Medicine

## 2020-01-19 DIAGNOSIS — N6310 Unspecified lump in the right breast, unspecified quadrant: Secondary | ICD-10-CM

## 2020-01-19 NOTE — Progress Notes (Deleted)
Cardiology Office Note    Date:  01/19/2020   ID:  Jody Duke, DOB 1971/04/29, MRN [id number]  PCP:  Wendall Mola, NP  Cardiologist: No primary care provider on file. EPS: None  No chief complaint on file.   History of Present Illness:  Jody Duke is a 49 y.o. female with history of DM, HTN, HLD and tobacco abuse, strong family history of CAD.  She saw Dr. Angelena Form 11/19/19 with chest pain typical and atypical. Echo and CTA ordered.  2D echo normal LVEF 55 to 60% mild mitral valvular regurgitation.  Repeat in 2 years Coronary CTA calcium score 0 normal coronary arteries, dilated main pulmonary artery 28 mm suggestive of pulmonary hypertension.  Echo did not show elevated pressure in her lungs.  Chest pain felt to be not related to her heart.    Past Medical History:  Diagnosis Date  . Ankle mass, left 07/30/2017  . Blood pressure elevated without history of HTN 07/30/2017  . Breast mass, left 07/2017  . Breast mass, right 07/30/2017  . Class 1 obesity due to excess calories with serious comorbidity and body mass index (BMI) of 33.0 to 33.9 in adult 07/30/2017  . Diabetes mellitus without complication ([place])   . Essential hypertension 06/21/2019  . Hematuria, microscopic 07/30/2017  . Hot flashes due to surgical menopause 12/28/2017  . Hyperlipidemia   . Menometrorrhagia 07/30/2017  . Migraines   . Pure hypercholesterolemia 07/30/2017  . Tobacco abuse 07/30/2017  . Type 2 diabetes mellitus with microalbuminuria, without long-term current use of insulin ([place]) 07/30/2017  . Uncontrolled type 2 diabetes mellitus with hyperglycemia ([place]) 08/26/2019  . Vaginal discharge 08/26/2019    Past Surgical History:  Procedure Laterality Date  . ANKLE SURGERY Left 10/15/2017  . BREAST BIOPSY Left 07/24/2017   Procedure: LEFT BREAST MASS EXCISIONAL BIOPSY;  Surgeon: Rolm Bookbinder, MD;  Location: [facility];  Service: General;  Laterality: Left;  . DEBULKING N/A  10/02/2017   Procedure: DEBULKING;  Surgeon: Isabel Caprice, MD;  [facility];  Service: Gynecology;  Laterality: N/A;  . FOOT GANGLION EXCISION Left    age 76  . HYSTERECTOMY ABDOMINAL WITH SALPINGO-OOPHORECTOMY Bilateral 10/02/2017   Procedure: HYSTERECTOMY ABDOMINAL WITH SALPINGO-OOPHORECTOMY;  Surgeon: Isabel Caprice, MD;  [facility];  Service: Gynecology;  Laterality: Bilateral;  bilateral salpingo-oophorectomy with possible total abdominal hysterectomy  . LAPAROTOMY WITH STAGING N/A 10/02/2017   Procedure: LAPAROTOMY WITH STAGING/OMENTECTOMY;  Surgeon: Isabel Caprice, MD;  [facility];  Service: Gynecology;  Laterality: N/A;  . RE-EXCISION OF BREAST LUMPECTOMY Left 08/25/2017   Procedure: LEFT RE-EXCISION OF BREAST MARGINS ERAS PATHWAY;  Surgeon: Rolm Bookbinder, MD;  Location: [facility];  Service: General;  Laterality: Left;    Current Medications: No outpatient medications have been marked as taking for the 01/24/20 encounter (Appointment) with Imogene Burn, PA-C.     Allergies:   Percocet [oxycodone-acetaminophen]   Social History   Socioeconomic History  . Marital status: Single    Spouse name: Not on file  . Number of children: 0  . Years of education: Not on file  . Highest education level: Not on file  Occupational History  . Occupation: She works at [employer]  . Smoking status: Current Every Day Smoker    Packs/day: 0.25    Years: 30.00    Pack years: 7.50    Types: Cigarettes  . Smokeless tobacco: Never Used  . Tobacco  comment: 1 daily  Vaping Use  . Vaping Use: Never used  Substance and Sexual Activity  . Alcohol use: No  . Drug use: No  . Sexual activity: Not Currently    Comment: 1st intercourse 49 yo-More than 5 partners  Other Topics Concern  . Not on file  Social History Narrative   Marital status: single; girlfriend x 3 years; from [place]; moved [place] in 2015      Children: none      Lives: with  girlfriend      Employment: works at [employer]; runs seasonal department      Tobacco: 1.5ppd x age 3.  Never quit.      Alcohol: None; rare      Drugs: week as teenager      Exercise: none      Sexual activity: females since 43; males prior.  One STD/Gonorrhea.   Social Determinants of Health   Financial Resource Strain:   . Difficulty of Paying Living Expenses:   Food Insecurity:   . Worried About Charity fundraiser in the Last Year:   . Arboriculturist in the Last Year:   Transportation Needs:   . Film/video editor (Medical):   Marland Kitchen Lack of Transportation (Non-Medical):   Physical Activity:   . Days of Exercise per Week:   . Minutes of Exercise per Session:   Stress:   . Feeling of Stress :   Social Connections:   . Frequency of Communication with Friends and Family:   . Frequency of Social Gatherings with Friends and Family:   . Attends Religious Services:   . Active Member of Clubs or Organizations:   . Attends Archivist Meetings:   Marland Kitchen Marital Status:      Family History:  The patient's ***family history includes Alcohol abuse in her mother; Breast cancer in her maternal grandmother; Cancer in her sister; Cirrhosis in her mother; Diabetes in her father, sister, and sister; Heart disease (age of onset: 82) in her father; Hyperlipidemia in her brother, father, mother, sister, and sister; Hypertension in her brother, father, sister, and sister; Pancreatic cancer in her paternal aunt and paternal aunt; Stroke in her father.   ROS:   Please see the history of present illness.    ROS All other systems reviewed and are negative.   PHYSICAL EXAM:   VS:  LMP 09/21/2017 (Exact Date)   Physical Exam  GEN: Well nourished, well developed, in no acute distress  HEENT: normal  Neck: no JVD, carotid bruits, or masses Cardiac:RRR; no murmurs, rubs, or gallops  Respiratory:  clear to auscultation bilaterally, normal work of breathing GI: soft, nontender, nondistended, +  BS Ext: without cyanosis, clubbing, or edema, Good distal pulses bilaterally MS: no deformity or atrophy  Skin: warm and dry, no rash Neuro:  Alert and Oriented x 3, Strength and sensation are intact Psych: euthymic mood, full affect  Wt Readings from Last 3 Encounters:  11/25/19 200 lb 12.8 oz (91.1 kg)  10/29/19 200 lb 9.6 oz (91 kg)  09/29/19 202 lb 6.4 oz (91.8 kg)      Studies/Labs Reviewed:   EKG:  EKG is*** ordered today.  The ekg ordered today demonstrates ***  Recent Labs: 06/21/2019: TSH 1.680 09/29/2019: ALT 31 11/25/2019: BUN 18; Creatinine, Ser 0.60; Potassium 3.9; Sodium 139   Lipid Panel    Component Value Date/Time   CHOL 206 (H) 09/29/2019 0918   TRIG 331 (H) 09/29/2019 5726  HDL 47 09/29/2019 0918   CHOLHDL 4.4 09/29/2019 0918   LDLCALC 103 (H) 09/29/2019 0918    Additional studies/ records that were reviewed today include:  Coronary CTA 6/21/2021Coronary calcium score: The patient's coronary artery calcium score is 0, which places the patient in the 0 percentile.   Coronary arteries: Normal coronary origins.  Right dominance.   Right Coronary Artery: Dominant. Gives off the R-PDA and R-PLB branches. No disease.   Left Main Coronary Artery: Normal. Bifurcates into the LAD and LCx arteries.   Left Anterior Descending Coronary Artery: Moderate size artery, reaches the apex. Gives off 2 proximal diagonal vessels. No significant disease.   Left Circumflex Artery: AV groove vessel without disease. Smaller mid-OM1 branch without disease.   Aorta: Normal size, 24 mm at the mid ascending aorta (level of the PA bifurcation) measured double oblique. No calcifications. No dissection.   Aortic Valve: Trileaflet.  No calcifications.   Other findings:   Normal pulmonary vein drainage into the left atrium.   Normal left atrial appendage without a thrombus.   Dilated main pulmonary artery at 28 mm, suggestive of pulmonary hypertension.    IMPRESSION: 1. No evidence of CAD, CADRADS = 0.   2. Coronary calcium score of 0. This was 0 percentile for age and sex matched control.   3. Normal coronary origin with right dominance.   4. Dilated main pulmonary artery at 28 mm, suggestive of pulmonary hypertension.   Electronically Signed: By: Pixie Casino M.D. On: 12/20/2019 14:40   IMPRESSIONS     1. Left ventricular ejection fraction, by estimation, is 55 to 60%. Left  ventricular ejection fraction by 3D volume is 59 %. The left ventricle has  normal function. The left ventricle has no regional wall motion  abnormalities. Left ventricular diastolic   parameters were normal. The average left ventricular global longitudinal  strain is -21.8 %. The global longitudinal strain is normal.   2. Right ventricular systolic function is normal. The right ventricular  size is normal. Tricuspid regurgitation signal is inadequate for assessing  PA pressure.   3. The mitral valve is grossly normal. Mild mitral valve regurgitation.  No evidence of mitral stenosis.   4. The aortic valve is tricuspid. Aortic valve regurgitation is not  visualized. No aortic stenosis is present.   5. The inferior vena cava is normal in size with greater than 50%  respiratory variability, suggesting right atrial pressure of 3 mmHg.   Conclusion(s)/Recommendation(s): Normal biventricular function without  evidence of hemodynamically significant valvular heart disease.      ASSESSMENT:    1. Coronary artery disease involving native coronary artery of native heart without angina pectoris   2. Family history of early CAD   3. Essential hypertension   4. Hyperlipidemia, unspecified hyperlipidemia type   5. Tobacco abuse      PLAN:  In order of problems listed above:  Chest pain not felt to be cardiac coronary CTA calcium score 0 normal coronary arteries question of pulmonary hypertension but normal heart pressures on 2D echo normal LV and RV  function.  Family history of CAD  Hypertension  HLD  Tobacco abuse    Medication Adjustments/Labs and Tests Ordered: Current medicines are reviewed at length with the patient today.  Concerns regarding medicines are outlined above.  Medication changes, Labs and Tests ordered today are listed in the Patient Instructions below. There are no Patient Instructions on file for this visit.   Sumner Boast, PA-C  01/19/2020 3:39  PM    [facility] [place], [place], [place]  [address] Phone: [phone]; Fax: [phone]

## 2020-01-21 ENCOUNTER — Other Ambulatory Visit: Payer: Self-pay | Admitting: Internal Medicine

## 2020-01-21 ENCOUNTER — Ambulatory Visit
Admission: RE | Admit: 2020-01-21 | Discharge: 2020-01-21 | Disposition: A | Discharge status: Home / Self Care | Payer: 59 | Source: Ambulatory Visit | Attending: Internal Medicine | Admitting: Internal Medicine

## 2020-01-21 ENCOUNTER — Other Ambulatory Visit: Payer: Self-pay

## 2020-01-21 DIAGNOSIS — N6310 Unspecified lump in the right breast, unspecified quadrant: Secondary | ICD-10-CM

## 2020-01-21 DIAGNOSIS — N6320 Unspecified lump in the left breast, unspecified quadrant: Secondary | ICD-10-CM

## 2020-01-22 ENCOUNTER — Other Ambulatory Visit: Payer: Self-pay | Admitting: Family Medicine

## 2020-01-24 ENCOUNTER — Ambulatory Visit: Payer: 59 | Admitting: Physician Assistant

## 2020-01-26 ENCOUNTER — Other Ambulatory Visit: Payer: Self-pay

## 2020-01-26 ENCOUNTER — Ambulatory Visit (INDEPENDENT_AMBULATORY_CARE_PROVIDER_SITE_OTHER): Payer: 59 | Admitting: Family Medicine

## 2020-01-26 ENCOUNTER — Encounter: Payer: Self-pay | Admitting: Family Medicine

## 2020-01-26 DIAGNOSIS — M25561 Pain in right knee: Secondary | ICD-10-CM | POA: Diagnosis not present

## 2020-01-26 DIAGNOSIS — M25562 Pain in left knee: Secondary | ICD-10-CM | POA: Insufficient documentation

## 2020-01-26 NOTE — Progress Notes (Signed)
    SUBJECTIVE:   CHIEF COMPLAINT / HPI:   Right Knee Pain Jody Duke is a pleasant 49 year old female who is a known patient in this practice who presents today for right knee pain.  She states her knee pain has been gradually increasing for the past month and notices that it gets worse with activities.  She works at [employer] and notices that when she bends down squats or puts pressure on the knee as she often needs to do at work but the pain increases.  It is hard for her to describe pain but states it is "all over the knee".  It does not radiate outside the knee and she has noticed associated swelling.  She has never had this issue before.  No recent injuries but she did take a fall to the right knee approximately 1 year ago.  She states that 1 night it became very painful and swollen she used icy hot and wrapped an Ace bandage around her knee very tight and elevated it and that did provide some relief.  PERTINENT  PMH / PSH: T2DM, HTN  OBJECTIVE:   BP 102/72   Ht 5\' 2"  (1.575 m)   Wt 197 lb (89.4 kg)   LMP 09/21/2017 (Exact Date)   BMI 36.03 kg/m   MSK: Knee, Right: Inspection was negative for erythema, ecchymosis, and effusion. She does have a bony prominence on the tibia where the patellar tendon inserts. No other obvious bony abnormalities or signs of osteophyte development. Palpation yielded no asymmetric warmth; Mild joint line tenderness medially and laterally; No condyle tenderness; No patellar tenderness; Mild patellar crepitus. Patellar and quadriceps tendons unremarkable, and no tenderness of the pes anserine bursa. No obvious Baker's cyst development. ROM decreased in flexion and limited to 90 degrees and normal extension . Normal hamstring and quadriceps strength. Neurovascularly intact bilaterally. Special Tests  - Cruciate Ligaments:   - Anterior Drawer:  NEG - Posterior Drawer: NEG   - Varus/Valgus Stress test: NEG  - Meniscus:   - McMurray's: NEG  - Patella:   -  Patellar grind/compression: NEG  ASSESSMENT/PLAN:   Right knee pain Given patient history and today's exam she most likely has the beginning of osteoarthritis of the right knee.  Possibly exacerbated by an injury that occurred 1 year ago and more acutely this most recent flare is likely due to overuse. - Patient prefers formal PT - Tylenol Extra Strength scheduled for 5-7 days then as needed - Ice, elevation at rest - Compression sleeve to be worn when active and at work     Nuala Alpha, [place]

## 2020-01-26 NOTE — Assessment & Plan Note (Signed)
Given patient history and today's exam she most likely has the beginning of osteoarthritis of the right knee.  Possibly exacerbated by an injury that occurred 1 year ago and more acutely this most recent flare is likely due to overuse. - Patient prefers formal PT - Tylenol Extra Strength scheduled for 5-7 days then as needed - Ice, elevation at rest - Compression sleeve to be worn when active and at work

## 2020-01-26 NOTE — Patient Instructions (Signed)
It was great to meet you today! Thank you for letting me participate in your care!  Today, we discussed your right knee pain which I think is due to arthritis. We will treat this with conservative therapy beginning with Tylenol Extra Strength scheduled over the next 5-7 days three times a day. When at home please elevate your knee and use ice, especially after work. Please use the knee brace while active and at work but not when you are at home or when you are sleeping. We will send in a prescription for PT and we will see you after you have completed PT to see if you are improving.   Be well, Harolyn Rutherford, DO PGY-4, Sports Medicine Fellow [place]

## 2020-02-02 ENCOUNTER — Ambulatory Visit: Payer: 59 | Admitting: Physician Assistant

## 2020-02-02 ENCOUNTER — Ambulatory Visit: Payer: 59 | Admitting: Physical Therapy

## 2020-02-03 ENCOUNTER — Ambulatory Visit: Payer: 59

## 2020-02-23 NOTE — Progress Notes (Deleted)
Cardiology Office Note    Date:  02/23/2020   ID:  Jody Duke, DOB October 03, 1970, MRN [id number]  PCP:  Nolene Ebbs, MD  Cardiologist: No primary care provider on file. EPS: None  No chief complaint on file.   History of Present Illness:  Jody Duke is a 49 y.o. female with history of DM, HTN, HLD and tobacco abuse, strong family history of CAD.  She saw Dr. Angelena Form 11/19/19 with chest pain typical and atypical. Echo and CTA ordered.  2D echo normal LVEF 55 to 60% mild mitral valvular regurgitation.  Repeat in 2 years. Coronary CTA calcium score 0 normal coronary arteries, dilated main pulmonary artery 28 mm suggestive of pulmonary hypertension.  Echo did not show elevated pressure in her lungs.  Chest pain felt to be not related to her heart.       Past Medical History:  Diagnosis Date  . Ankle mass, left 07/30/2017  . Blood pressure elevated without history of HTN 07/30/2017  . Breast mass, left 07/2017  . Breast mass, right 07/30/2017  . Class 1 obesity due to excess calories with serious comorbidity and body mass index (BMI) of 33.0 to 33.9 in adult 07/30/2017  . Diabetes mellitus without complication ([place])   . Essential hypertension 06/21/2019  . Hematuria, microscopic 07/30/2017  . Hot flashes due to surgical menopause 12/28/2017  . Hyperlipidemia   . Menometrorrhagia 07/30/2017  . Migraines   . Pure hypercholesterolemia 07/30/2017  . Tobacco abuse 07/30/2017  . Type 2 diabetes mellitus with microalbuminuria, without long-term current use of insulin ([place]) 07/30/2017  . Uncontrolled type 2 diabetes mellitus with hyperglycemia ([place]) 08/26/2019  . Vaginal discharge 08/26/2019    Past Surgical History:  Procedure Laterality Date  . ANKLE SURGERY Left 10/15/2017  . BREAST BIOPSY Left 07/24/2017   Procedure: LEFT BREAST MASS EXCISIONAL BIOPSY;  Surgeon: Rolm Bookbinder, MD;  Location: [facility];  Service: General;  Laterality: Left;  . DEBULKING  N/A 10/02/2017   Procedure: DEBULKING;  Surgeon: Isabel Caprice, MD;  Location: [facility];  Service: Gynecology;  Laterality: N/A;  . FOOT GANGLION EXCISION Left    age 31  . HYSTERECTOMY ABDOMINAL WITH SALPINGO-OOPHORECTOMY Bilateral 10/02/2017   Procedure: HYSTERECTOMY ABDOMINAL WITH SALPINGO-OOPHORECTOMY;  Surgeon: Isabel Caprice, MD;  Location: [facility];  Service: Gynecology;  Laterality: Bilateral;  bilateral salpingo-oophorectomy with possible total abdominal hysterectomy  . LAPAROTOMY WITH STAGING N/A 10/02/2017   Procedure: LAPAROTOMY WITH STAGING/OMENTECTOMY;  Surgeon: Isabel Caprice, MD;  Location: [facility];  Service: Gynecology;  Laterality: N/A;  . RE-EXCISION OF BREAST LUMPECTOMY Left 08/25/2017   Procedure: LEFT RE-EXCISION OF BREAST MARGINS ERAS PATHWAY;  Surgeon: Rolm Bookbinder, MD;  Location: [facility];  Service: General;  Laterality: Left;    Current Medications: No outpatient medications have been marked as taking for the 02/29/20 encounter (Appointment) with Imogene Burn, PA-C.     Allergies:   Percocet [oxycodone-acetaminophen]   Social History   Socioeconomic History  . Marital status: Single    Spouse name: Not on file  . Number of children: 0  . Years of education: Not on file  . Highest education level: Not on file  Occupational History  . Occupation: She works at [employer]  . Smoking status: Current Every Day Smoker    Packs/day: 0.25    Years: 30.00    Pack years: 7.50    Types: Cigarettes  . Smokeless tobacco: Never Used  .  Tobacco comment: 1 daily  Vaping Use  . Vaping Use: Never used  Substance and Sexual Activity  . Alcohol use: No  . Drug use: No  . Sexual activity: Not Currently    Comment: 1st intercourse 49 yo-More than 5 partners  Other Topics Concern  . Not on file  Social History Narrative   Marital status: single; girlfriend x 3 years; from [place]; moved [place] in 2015      Children: none      Lives: with  girlfriend      Employment: works at [employer]; runs seasonal department      Tobacco: 1.5ppd x age 54.  Never quit.      Alcohol: None; rare      Drugs: week as teenager      Exercise: none      Sexual activity: females since 22; males prior.  One STD/Gonorrhea.   Social Determinants of Health   Financial Resource Strain:   . Difficulty of Paying Living Expenses: Not on file  Food Insecurity:   . Worried About Charity fundraiser in the Last Year: Not on file  . Ran Out of Food in the Last Year: Not on file  Transportation Needs:   . Lack of Transportation (Medical): Not on file  . Lack of Transportation (Non-Medical): Not on file  Physical Activity:   . Days of Exercise per Week: Not on file  . Minutes of Exercise per Session: Not on file  Stress:   . Feeling of Stress : Not on file  Social Connections:   . Frequency of Communication with Friends and Family: Not on file  . Frequency of Social Gatherings with Friends and Family: Not on file  . Attends Religious Services: Not on file  . Active Member of Clubs or Organizations: Not on file  . Attends Archivist Meetings: Not on file  . Marital Status: Not on file     Family History:  The patient's ***family history includes Alcohol abuse in her mother; Breast cancer in her maternal grandmother; Cancer in her sister; Cirrhosis in her mother; Diabetes in her father, sister, and sister; Heart disease (age of onset: 66) in her father; Hyperlipidemia in her brother, father, mother, sister, and sister; Hypertension in her brother, father, sister, and sister; Pancreatic cancer in her paternal aunt and paternal aunt; Stroke in her father.   ROS:   Please see the history of present illness.    ROS All other systems reviewed and are negative.   PHYSICAL EXAM:   VS:  LMP 09/21/2017 (Exact Date)   Physical Exam  GEN: Well nourished, well developed, in no acute distress  HEENT: normal  Neck: no JVD, carotid bruits, or  masses Cardiac:RRR; no murmurs, rubs, or gallops  Respiratory:  clear to auscultation bilaterally, normal work of breathing GI: soft, nontender, nondistended, + BS Ext: without cyanosis, clubbing, or edema, Good distal pulses bilaterally MS: no deformity or atrophy  Skin: warm and dry, no rash Neuro:  Alert and Oriented x 3, Strength and sensation are intact Psych: euthymic mood, full affect  Wt Readings from Last 3 Encounters:  01/26/20 197 lb (89.4 kg)  11/25/19 200 lb 12.8 oz (91.1 kg)  10/29/19 200 lb 9.6 oz (91 kg)      Studies/Labs Reviewed:   EKG:  EKG is*** ordered today.  The ekg ordered today demonstrates ***  Recent Labs: 06/21/2019: TSH 1.680 09/29/2019: ALT 31 11/25/2019: BUN 18; Creatinine, Ser 0.60; Potassium 3.9; Sodium  139   Lipid Panel    Component Value Date/Time   CHOL 206 (H) 09/29/2019 0918   TRIG 331 (H) 09/29/2019 0918   HDL 47 09/29/2019 0918   CHOLHDL 4.4 09/29/2019 0918   LDLCALC 103 (H) 09/29/2019 0918    Additional studies/ records that were reviewed today include:  Coronary calcium score: The patient's coronary artery calcium score is 0, which places the patient in the 0 percentile.   Coronary arteries: Normal coronary origins.  Right dominance.   Right Coronary Artery: Dominant. Gives off the R-PDA and R-PLB branches. No disease.   Left Main Coronary Artery: Normal. Bifurcates into the LAD and LCx arteries.   Left Anterior Descending Coronary Artery: Moderate size artery, reaches the apex. Gives off 2 proximal diagonal vessels. No significant disease.   Left Circumflex Artery: AV groove vessel without disease. Smaller mid-OM1 branch without disease.   Aorta: Normal size, 24 mm at the mid ascending aorta (level of the PA bifurcation) measured double oblique. No calcifications. No dissection.   Aortic Valve: Trileaflet.  No calcifications.   Other findings:   Normal pulmonary vein drainage into the left atrium.   Normal left  atrial appendage without a thrombus.   Dilated main pulmonary artery at 28 mm, suggestive of pulmonary hypertension.   IMPRESSION: 1. No evidence of CAD, CADRADS = 0.   2. Coronary calcium score of 0. This was 0 percentile for age and sex matched control.   3. Normal coronary origin with right dominance.   4. Dilated main pulmonary artery at 28 mm, suggestive of pulmonary hypertension.   Electronically Signed: By: Pixie Casino M.D. On: 12/20/2019 14:40   IMPRESSIONS     1. Left ventricular ejection fraction, by estimation, is 55 to 60%. Left  ventricular ejection fraction by 3D volume is 59 %. The left ventricle has  normal function. The left ventricle has no regional wall motion  abnormalities. Left ventricular diastolic   parameters were normal. The average left ventricular global longitudinal  strain is -21.8 %. The global longitudinal strain is normal.   2. Right ventricular systolic function is normal. The right ventricular  size is normal. Tricuspid regurgitation signal is inadequate for assessing  PA pressure.   3. The mitral valve is grossly normal. Mild mitral valve regurgitation.  No evidence of mitral stenosis.   4. The aortic valve is tricuspid. Aortic valve regurgitation is not  visualized. No aortic stenosis is present.   5. The inferior vena cava is normal in size with greater than 50%  respiratory variability, suggesting right atrial pressure of 3 mmHg.   Conclusion(s)/Recommendation(s): Normal biventricular function without  evidence of hemodynamically significant valvular heart disease.      ASSESSMENT:    No diagnosis found.   PLAN:  In order of problems listed above:  Chest pain not felt to be cardiac coronary CTA calcium score 0 normal coronary arteries question of pulmonary hypertension but normal heart pressures on 2D echo normal LV and RV function.  Family history of CAD  Hypertension  HLD  Tobacco abuse     Medication  Adjustments/Labs and Tests Ordered: Current medicines are reviewed at length with the patient today.  Concerns regarding medicines are outlined above.  Medication changes, Labs and Tests ordered today are listed in the Patient Instructions below. There are no Patient Instructions on file for this visit.   Sumner Boast, PA-C  02/23/2020 9:23 AM    [facility] [place],  [place], [place]  [address] Phone: [phone]; Fax: [phone]

## 2020-02-29 ENCOUNTER — Ambulatory Visit: Payer: 59 | Admitting: Physician Assistant

## 2020-06-21 ENCOUNTER — Encounter: Payer: 59 | Admitting: Nurse Practitioner

## 2020-07-12 ENCOUNTER — Encounter: Payer: 59 | Admitting: Nurse Practitioner

## 2020-07-12 NOTE — Progress Notes (Deleted)
Pap: 10-02-17  Mammogram: 01-21-20

## 2020-07-14 ENCOUNTER — Other Ambulatory Visit: Payer: Self-pay

## 2020-07-14 ENCOUNTER — Ambulatory Visit (INDEPENDENT_AMBULATORY_CARE_PROVIDER_SITE_OTHER): Payer: 59 | Admitting: Family Medicine

## 2020-07-14 ENCOUNTER — Other Ambulatory Visit: Payer: Self-pay | Admitting: Family Medicine

## 2020-07-14 VITALS — BP 121/82 | Ht 61.0 in | Wt 190.0 lb

## 2020-07-14 DIAGNOSIS — M5010 Cervical disc disorder with radiculopathy, unspecified cervical region: Secondary | ICD-10-CM | POA: Diagnosis not present

## 2020-07-14 DIAGNOSIS — M542 Cervicalgia: Secondary | ICD-10-CM | POA: Diagnosis not present

## 2020-07-14 DIAGNOSIS — M545 Low back pain, unspecified: Secondary | ICD-10-CM | POA: Insufficient documentation

## 2020-07-14 MED ORDER — CYCLOBENZAPRINE HCL 5 MG PO TABS: 5.0000 mg | ORAL_TABLET | Freq: Two times a day (BID) | ORAL | 0 refills | Status: DC | PRN

## 2020-07-14 MED ORDER — TRAMADOL HCL 50 MG PO TABS: 50.0000 mg | ORAL_TABLET | Freq: Four times a day (QID) | ORAL | 0 refills | Status: DC | PRN

## 2020-07-14 NOTE — Assessment & Plan Note (Signed)
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 She has history of good response to epidural injections and requests referral to have another as symptoms have resurfaced lately - Referral to [facility] for cervical epidural injections; last injection in 2/32021

## 2020-07-14 NOTE — Progress Notes (Signed)
SM[facility]: Attending Note: I have reviewed the chart, discussed wit the Sports Medicine Fellow. I agree with assessment and treatment plan as detailed in the Fellow's note.  

## 2020-07-14 NOTE — Progress Notes (Signed)
    SUBJECTIVE:   CHIEF COMPLAINT / HPI:   Low Back Pain Ms Jakes is a pleasant 50y/o female who presents with one day of acute low back pain without any trauma or fall. She is not sure what caused her pain. She describes it as a burning sensation radiating in a band across her lower back with some radiation into her hips. She went to bend over to pick something up and it just "gave out". She states she has had this before but not this bad. She denies any radiation through the buttock or down the legs. No numbness or tingling. Pain rated as a 10/10 and constant. She has difficulty sitting, or standing up straight. Position of ease is slightly flexed forward leaning on exam table.  Of note she also states her long standing neck pain with radiation down her arms has been returning lately. It is becoming more frequent and coming for her neck all the way to her hands. She states she had a great response from the epidural injections and would like to have them again. She states it has almost been a year since she last had one.  PERTINENT  PMH / PSH: HTN, T2DM, Tobacco abuse  OBJECTIVE:   BP 121/82   Ht 5\' 1"  (1.549 m)   Wt 190 lb (86.2 kg)   LMP 09/21/2017 (Exact Date)   BMI 35.90 kg/m   No flowsheet data found.  Lumbar spine:  - Inspection: no gross deformity or asymmetry, swelling or ecchymosis. No skin changes - Palpation: No TTP over the spinous processes, TTP with muscle hypertonicity of the lumbar paraspinal muscles bilaterally, no SI joints pain b/l - ROM: markedly decrease ROM of the lumbar spine in flexion and extension due to pain - Strength: 5/5 strength of lower extremity in L4-S1 nerve root distributions b/l - Neuro: gross sensation intact   ASSESSMENT/PLAN:   Acute low back pain Patient with one day of acute low back pain with history, presentation, and exam most consistent with low back muscle strain/pull - Cont Ibuprofen 800mg  TID with Tylenol 500mg  TID - Heating pad,  relative rest - Flexeril 5mg  BID - Out of work for 4 days, follow up in 4 weeks  Neck pain Patient with 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 She has history of good response to epidural injections and requests referral to have another as symptoms have resurfaced lately - Referral to [facility] for cervical epidural injections; last injection in 2/32021     Nuala Alpha, DO PGY-4, Sports Medicine Fellow [place]

## 2020-07-14 NOTE — Patient Instructions (Addendum)
It was great to see you again! Thank you for letting me participate in your care!  Today, we discussed your low back pain which is most likely from an acute muscle strain in your low back. I recommend rest, use of a heating pad, OTC capsaicin cream, Tylenol Extra Strength TID scheduled WITH Ibuprofen 800mg  TID scheduled for the next 7 days. I want you to take Flexeril at night time. Please don't try to drive after taking the muscle relaxer. Take Tramadol as needed.  I will also put in for repeat neck epidural injections at [facility].  Be well, Harolyn Rutherford, DO PGY-4, Sports Medicine Fellow [place]

## 2020-07-14 NOTE — Assessment & Plan Note (Signed)
Patient with one day of acute low back pain with history, presentation, and exam most consistent with low back muscle strain/pull - Cont Ibuprofen 800mg  TID with Tylenol 500mg  TID - Heating pad, relative rest - Flexeril 5mg  BID - Out of work for 4 days, follow up in 4 weeks

## 2020-07-17 ENCOUNTER — Telehealth: Payer: Self-pay | Admitting: *Deleted

## 2020-07-19 NOTE — Telephone Encounter (Signed)
[place] See if we can refer her for injection at [facility]. THANKS! Jody Duke

## 2020-07-20 ENCOUNTER — Other Ambulatory Visit: Payer: Self-pay | Admitting: *Deleted

## 2020-07-20 DIAGNOSIS — M545 Low back pain, unspecified: Secondary | ICD-10-CM

## 2020-07-25 ENCOUNTER — Other Ambulatory Visit: Payer: Self-pay

## 2020-07-25 ENCOUNTER — Ambulatory Visit
Admission: RE | Admit: 2020-07-25 | Discharge: 2020-07-25 | Disposition: A | Discharge status: Home / Self Care | Payer: 59 | Source: Ambulatory Visit | Attending: Family Medicine | Admitting: Family Medicine

## 2020-07-25 DIAGNOSIS — M5010 Cervical disc disorder with radiculopathy, unspecified cervical region: Secondary | ICD-10-CM

## 2020-07-25 MED ORDER — IOPAMIDOL (ISOVUE-M 300) INJECTION 61%
1.0000 mL | Freq: Once | INTRAMUSCULAR | Status: AC
  Administered 2020-07-25: 1 mL via INTRATHECAL

## 2020-07-25 MED ORDER — TRIAMCINOLONE ACETONIDE 40 MG/ML IJ SUSP (RADIOLOGY)
60.0000 mg | Freq: Once | INTRAMUSCULAR | Status: AC
  Administered 2020-07-25: 60 mg via EPIDURAL

## 2020-07-25 NOTE — Discharge Instructions (Signed)

## 2020-07-27 ENCOUNTER — Other Ambulatory Visit: Payer: Self-pay

## 2020-07-28 ENCOUNTER — Other Ambulatory Visit: Payer: Self-pay | Admitting: Nurse Practitioner

## 2020-07-28 NOTE — Telephone Encounter (Signed)
Annual exam scheduled on 08/10/20 

## 2020-08-10 ENCOUNTER — Other Ambulatory Visit: Payer: Self-pay

## 2020-08-10 ENCOUNTER — Encounter: Payer: Self-pay | Admitting: Nurse Practitioner

## 2020-08-10 ENCOUNTER — Ambulatory Visit (INDEPENDENT_AMBULATORY_CARE_PROVIDER_SITE_OTHER): Payer: 59 | Admitting: Nurse Practitioner

## 2020-08-10 VITALS — BP 122/83 | Ht 62.0 in | Wt 192.8 lb

## 2020-08-10 DIAGNOSIS — Z79890 Hormone replacement therapy (postmenopausal): Secondary | ICD-10-CM | POA: Diagnosis not present

## 2020-08-10 DIAGNOSIS — R232 Flushing: Secondary | ICD-10-CM | POA: Diagnosis not present

## 2020-08-10 DIAGNOSIS — Z01419 Encounter for gynecological examination (general) (routine) without abnormal findings: Secondary | ICD-10-CM

## 2020-08-10 MED ORDER — ESTRADIOL 1 MG PO TABS: 1.0000 mg | ORAL_TABLET | Freq: Every day | ORAL | 3 refills | Status: DC

## 2020-08-10 NOTE — Patient Instructions (Addendum)
[place], [place], [place] ginseng  Health Maintenance for Postmenopausal Women Menopause is a normal process in which your ability to get pregnant comes to an end. This process happens slowly over many months or years, usually between the ages of 20 and 65. Menopause is complete when you have missed your menstrual periods for 12 months. It is important to talk with your health care provider about some of the most common conditions that affect women after menopause (postmenopausal women). These include heart disease, cancer, and bone loss (osteoporosis). Adopting a healthy lifestyle and getting preventive care can help to promote your health and wellness. The actions you take can also lower your chances of developing some of these common conditions. What should I know about menopause? During menopause, you may get a number of symptoms, such as:  Hot flashes. These can be moderate or severe.  Night sweats.  Decrease in sex drive.  Mood swings.  Headaches.  Tiredness.  Irritability.  Memory problems.  Insomnia. Choosing to treat or not to treat these symptoms is a decision that you make with your health care provider. Do I need hormone replacement therapy?  Hormone replacement therapy is effective in treating symptoms that are caused by menopause, such as hot flashes and night sweats.  Hormone replacement carries certain risks, especially as you become older. If you are thinking about using estrogen or estrogen with progestin, discuss the benefits and risks with your health care provider. What is my risk for heart disease and stroke? The risk of heart disease, heart attack, and stroke increases as you age. One of the causes may be a change in the body's hormones during menopause. This can affect how your body uses dietary fats, triglycerides, and cholesterol. Heart attack and stroke are medical emergencies. There are many things that you can do to help prevent heart disease and  stroke. Watch your blood pressure  High blood pressure causes heart disease and increases the risk of stroke. This is more likely to develop in people who have high blood pressure readings, are of African descent, or are overweight.  Have your blood pressure checked: ? Every 3-5 years if you are 72-22 years of age. ? Every year if you are 94 years old or older. Eat a healthy diet  Eat a diet that includes plenty of vegetables, fruits, low-fat dairy products, and lean protein.  Do not eat a lot of foods that are high in solid fats, added sugars, or sodium.   Get regular exercise Get regular exercise. This is one of the most important things you can do for your health. Most adults should:  Try to exercise for at least 150 minutes each week. The exercise should increase your heart rate and make you sweat (moderate-intensity exercise).  Try to do strengthening exercises at least twice each week. Do these in addition to the moderate-intensity exercise.  Spend less time sitting. Even light physical activity can be beneficial. Other tips  Work with your health care provider to achieve or maintain a healthy weight.  Do not use any products that contain nicotine or tobacco, such as cigarettes, e-cigarettes, and chewing tobacco. If you need help quitting, ask your health care provider.  Know your numbers. Ask your health care provider to check your cholesterol and your blood sugar (glucose). Continue to have your blood tested as directed by your health care provider. Do I need screening for cancer? Depending on your health history and family history, you may need to have cancer screening  at different stages of your life. This may include screening for:  Breast cancer.  Cervical cancer.  Lung cancer.  Colorectal cancer. What is my risk for osteoporosis? After menopause, you may be at increased risk for osteoporosis. Osteoporosis is a condition in which bone destruction happens more  quickly than new bone creation. To help prevent osteoporosis or the bone fractures that can happen because of osteoporosis, you may take the following actions:  If you are 65-70 years old, get at least 1,000 mg of calcium and at least 600 mg of vitamin D per day.  If you are older than age 48 but younger than age 51, get at least 1,200 mg of calcium and at least 600 mg of vitamin D per day.  If you are older than age 61, get at least 1,200 mg of calcium and at least 800 mg of vitamin D per day. Smoking and drinking excessive alcohol increase the risk of osteoporosis. Eat foods that are rich in calcium and vitamin D, and do weight-bearing exercises several times each week as directed by your health care provider. How does menopause affect my mental health? Depression may occur at any age, but it is more common as you become older. Common symptoms of depression include:  Low or sad mood.  Changes in sleep patterns.  Changes in appetite or eating patterns.  Feeling an overall lack of motivation or enjoyment of activities that you previously enjoyed.  Frequent crying spells. Talk with your health care provider if you think that you are experiencing depression. General instructions See your health care provider for regular wellness exams and vaccines. This may include:  Scheduling regular health, dental, and eye exams.  Getting and maintaining your vaccines. These include: ? Influenza vaccine. Get this vaccine each year before the flu season begins. ? Pneumonia vaccine. ? Shingles vaccine. ? Tetanus, diphtheria, and pertussis (Tdap) booster vaccine. Your health care provider may also recommend other immunizations. Tell your health care provider if you have ever been abused or do not feel safe at home. Summary  Menopause is a normal process in which your ability to get pregnant comes to an end.  This condition causes hot flashes, night sweats, decreased interest in sex, mood swings,  headaches, or lack of sleep.  Treatment for this condition may include hormone replacement therapy.  Take actions to keep yourself healthy, including exercising regularly, eating a healthy diet, watching your weight, and checking your blood pressure and blood sugar levels.  Get screened for cancer and depression. Make sure that you are up to date with all your vaccines. This information is not intended to replace advice given to you by your health care provider. Make sure you discuss any questions you have with your health care provider. Document Revised: 06/10/2018 Document Reviewed: 06/10/2018 Elsevier Patient Education  2021 [employer].

## 2020-08-10 NOTE — Progress Notes (Signed)
   Jody Duke [birth year] [id number]   History:  50 y.o. G0 presents for annual exam. 2019 TAH BSO for menorrhagia and bilateral cysts, on ERT. She does complain of still having daily hot flashes and night sweats. Normal pap and mammogram history. History of T2DM, HTN. She has restarted smoking and currently smokes about 5 cigarettes per day.   Gynecologic History Patient's last menstrual period was 09/21/2017 (exact date).   Contraception/Family planning: status post hysterectomy  Health Maintenance Last Pap: No longer screening per guidelines Last mammogram: 01/21/2020. Results were: fibrocystic changes in left breast, negative for malignancy Last colonoscopy: N/A Last Dexa: N/A  Past medical history, past surgical history, family history and social history were all reviewed and documented in the EPIC chart.  ROS:  A ROS was performed and pertinent positives and negatives are included.  Exam:  Vitals:   08/10/20 0856  BP: 122/83  Weight: 192 lb 12.8 oz (87.5 kg)  Height: 5\' 2"  (1.575 m)   Body mass index is 35.26 kg/m.  General appearance:  Normal Thyroid:  Symmetrical, normal in size, without palpable masses or nodularity. Respiratory  Auscultation:  Clear without wheezing or rhonchi Cardiovascular  Auscultation:  Regular rate, without rubs, murmurs or gallops  Edema/varicosities:  Not grossly evident Abdominal  Soft,nontender, without masses, guarding or rebound.  Liver/spleen:  No organomegaly noted  Hernia:  None appreciated  Skin  Inspection:  Grossly normal   Breasts: Examined lying and sitting.   Right: Without masses, retractions, discharge or axillary adenopathy.   Left: Without masses, retractions, discharge or axillary adenopathy. Gentitourinary   Inguinal/mons:  Normal without inguinal adenopathy  External genitalia:  Normal  BUS/Urethra/Skene's glands:  Normal  Vagina:  Normal  Cervix:  Absent  Uterus:  Absent  Adnexa/parametria:      Rt: Without masses or tenderness.   Lt: Without masses or tenderness.  Anus and perineum: Normal  Digital rectal exam: Normal sphincter tone without palpated masses or tenderness  Assessment/Plan:  50 y.o. G0 for annual exam.   Well woman exam with routine gynecological exam - Education provided on SBEs, importance of preventative screenings, current guidelines, high calcium diet, regular exercise, and multivitamin daily. Labs with PCP.   Hormone replacement therapy (HRT) - Plan: estradiol (ESTRACE) 1 MG tablet daily. She is aware of the risks for blood clots, stroke, heart attack, and breast cancer and her increased risk with smoking. She would like to continue. Refill x 1 year provided.  Hot flashes -Taking Estradiol 1 mg daily. Discussed avoiding triggers such as alcohol, nicotine, and caffeine, wearing less clothing, and using fans.   Screening for cervical cancer - Normal Pap history.  No longer screening per guidelines.   Screening for breast cancer - Normal mammogram history.  Continue annual screenings.  Normal breast exam today.  Screening for colon cancer - Will start screening colonoscopies at age 12.   Follow up in 1 year for annual.      Tamela Gammon [facility], 9:05 AM 08/10/2020

## 2020-08-11 ENCOUNTER — Encounter: Payer: Self-pay | Admitting: Family Medicine

## 2020-08-11 ENCOUNTER — Ambulatory Visit: Payer: 59 | Admitting: Family Medicine

## 2020-08-11 VITALS — BP 112/78 | Ht 61.0 in | Wt 192.0 lb

## 2020-08-11 DIAGNOSIS — M5010 Cervical disc disorder with radiculopathy, unspecified cervical region: Secondary | ICD-10-CM | POA: Diagnosis not present

## 2020-08-11 DIAGNOSIS — M25561 Pain in right knee: Secondary | ICD-10-CM

## 2020-08-11 MED ORDER — METHYLPREDNISOLONE ACETATE 40 MG/ML IJ SUSP
40.0000 mg | Freq: Once | INTRAMUSCULAR | Status: AC
  Administered 2020-08-11: 40 mg via INTRA_ARTICULAR

## 2020-08-11 NOTE — Progress Notes (Signed)
SM[facility]: Attending Note: I have reviewed the chart, discussed wit the Sports Medicine Fellow. I agree with assessment and treatment plan as detailed in the Fellow's note.  

## 2020-08-11 NOTE — Progress Notes (Signed)
PCP: Nolene Ebbs, MD  Subjective:   HPI: Patient is a 50 y.o. female with chronic neck pain secondary to cervical DDD and known right knee osteoarthritis here for follow-up on neck, low back, and right knee pain.  She was last seen here on 07/14/2020, at that time was having acute low back pain which was treated conservatively.  She reports that she has had resolution of this but today continues to have neck and right knee pain.  Neck pain Heer has known diffuse cervical DDD in C3-C7 which was seen on MRI on 01/12/2019.  She has had associated bilateral upper extremity radicular symptoms for years.  She has had multiple epidural corticosteroid injections in the past for this with benefit.  Most recently, she had 1 epidural injection on 07/25/2020 after her last appointment and reports that she did have some benefit but continues to have neck pain with bilateral upper extremity numbness, tingling and pain.  No new trauma or injury, but this pain feels the same as her chronic neck pain.  Right knee pain Patient was seen for this on 01/26/2020, at that time had a gradually progressive worsening right knee pain which was felt likely to be OA.  She was given a corticosteroid injection at that time and reports significant improvement with this until the last month or so when it started coming back.  She does have a popping and cracking sensation when she flexes and extends her knee.  Pain especially worse with deep squatting.   Review of Systems:  Per HPI.   [place], medications and smoking status reviewed.      Objective:  Physical Exam:  No flowsheet data found.   Gen: awake, alert, NAD, comfortable in exam room Pulm: breathing unlabored  Cervical spine: -Inspection: No significant erythema, edema or abnormalities.  She does have some loss of lordosis. -Palpation: Diffusely tender paraspinally on both sides and extending into bilateral trapezius muscles -ROM: Limited in all planes but  especially with rotation to the right.  Pain with movement in all planes.  Full range of motion of bilateral shoulders. -Strength: 5/5 strength in all planes cervical spine, 5/5 strength in all planes of right and left shoulders. -Sensation: Normal sensation in bilateral upper extremities -Special tests: Positive Spurling's bilaterally, most significant on the right  Right knee  Inspection: Bilateral knees without evidence of erythema, ecchymosis, swelling, edema. No effusion present  Active ROM: Intact. 0-160d. Passive ROM: 3d passive hyperextension  Strength: 5/5 strength to resisted flexion/extension without pain  Patella: Positive patellar grind.  She does have crepitus with flexion and extension of the knee.  No proximal or distal patellar tendon tenderness to palpation. No quad tendon tenderness to palpation.  Tibia: No tibial plateau, tibial tuberosity tenderness.  Joint line: Bilateral joint line tenderness Popliteal: No popliteal tenderness to the insertional gastroc. No insertional biceps femoris, semimembranosis, semitendinosis tenderness.  Lachmans: Stable bilaterally with firm endpoint  Anterior/Posterior drawer: Stable bilaterally Varus/valgus stress at 0, 15d: Negative for pain, laxity    Assessment & Plan:  1.  Cervical DDD with radiculopathy Patient with known diffuse cervical DDD with chronic intermittent radicular symptoms bilaterally.  She did have some benefit with epidural steroid injection recently, though not complete benefit.  Given this, we will send her for repeat injection.  2.  Right knee OA Signs and symptoms consistent with osteoarthritis.  She did have significant benefit with CSI which lasted about 6 months.  She does not have any x-rays on file, so  we will plan to obtain those to investigate severity of arthritis.  Corticosteroid injection was also repeated today given significant provement in the past.  We will plan to follow-up 2 to 3 weeks after her  epidural steroid injection.  Procedure note: After informed written consent timeout was performed, patient was seated on exam table. Right knee was prepped with alcohol swab and utilizing anteromedial approach, patient's right knee was injected intraarticularly with 4:1 bupivicaine: depomedrol. Patient tolerated the procedure well without immediate complications.   Dagoberto Ligas, MD [facility] Fellow 08/11/2020 9:04 AM

## 2020-08-14 ENCOUNTER — Other Ambulatory Visit: Payer: Self-pay | Admitting: Family Medicine

## 2020-08-14 DIAGNOSIS — M5010 Cervical disc disorder with radiculopathy, unspecified cervical region: Secondary | ICD-10-CM

## 2020-08-17 ENCOUNTER — Ambulatory Visit
Admission: RE | Admit: 2020-08-17 | Discharge: 2020-08-17 | Disposition: A | Discharge status: Home / Self Care | Payer: 59 | Source: Ambulatory Visit | Attending: Family Medicine | Admitting: Family Medicine

## 2020-08-17 ENCOUNTER — Other Ambulatory Visit: Payer: Self-pay

## 2020-08-17 DIAGNOSIS — M5010 Cervical disc disorder with radiculopathy, unspecified cervical region: Secondary | ICD-10-CM

## 2020-08-17 MED ORDER — TRIAMCINOLONE ACETONIDE 40 MG/ML IJ SUSP (RADIOLOGY)
60.0000 mg | Freq: Once | INTRAMUSCULAR | Status: AC
  Administered 2020-08-17: 60 mg via EPIDURAL

## 2020-08-17 MED ORDER — IOPAMIDOL (ISOVUE-M 300) INJECTION 61%
1.0000 mL | Freq: Once | INTRAMUSCULAR | Status: AC | PRN
  Administered 2020-08-17: 1 mL via EPIDURAL

## 2020-08-17 NOTE — Discharge Instructions (Signed)

## 2020-09-05 ENCOUNTER — Other Ambulatory Visit: Payer: Self-pay | Admitting: *Deleted

## 2020-09-05 DIAGNOSIS — M542 Cervicalgia: Secondary | ICD-10-CM

## 2020-09-06 ENCOUNTER — Other Ambulatory Visit: Payer: Self-pay | Admitting: Family Medicine

## 2020-09-06 DIAGNOSIS — M542 Cervicalgia: Secondary | ICD-10-CM

## 2020-09-11 ENCOUNTER — Ambulatory Visit
Admission: RE | Admit: 2020-09-11 | Discharge: 2020-09-11 | Disposition: A | Discharge status: Home / Self Care | Payer: 59 | Source: Ambulatory Visit | Attending: Family Medicine | Admitting: Family Medicine

## 2020-09-11 ENCOUNTER — Other Ambulatory Visit: Payer: Self-pay

## 2020-09-11 DIAGNOSIS — M542 Cervicalgia: Secondary | ICD-10-CM

## 2020-09-11 MED ORDER — TRIAMCINOLONE ACETONIDE 40 MG/ML IJ SUSP (RADIOLOGY)
60.0000 mg | Freq: Once | INTRAMUSCULAR | Status: AC
  Administered 2020-09-11: 60 mg via EPIDURAL

## 2020-09-11 MED ORDER — IOPAMIDOL (ISOVUE-M 300) INJECTION 61%
1.0000 mL | Freq: Once | INTRAMUSCULAR | Status: AC | PRN
  Administered 2020-09-11: 1 mL via EPIDURAL

## 2020-09-11 NOTE — Discharge Instructions (Signed)

## 2020-11-17 ENCOUNTER — Other Ambulatory Visit: Payer: Self-pay

## 2020-11-17 ENCOUNTER — Encounter: Payer: Self-pay | Admitting: Family Medicine

## 2020-11-17 ENCOUNTER — Ambulatory Visit (INDEPENDENT_AMBULATORY_CARE_PROVIDER_SITE_OTHER): Payer: 59 | Admitting: Family Medicine

## 2020-11-17 VITALS — BP 116/80 | Ht 61.0 in | Wt 190.0 lb

## 2020-11-17 DIAGNOSIS — M1711 Unilateral primary osteoarthritis, right knee: Secondary | ICD-10-CM

## 2020-11-17 MED ORDER — METHYLPREDNISOLONE ACETATE 40 MG/ML IJ SUSP
40.0000 mg | Freq: Once | INTRAMUSCULAR | Status: AC
  Administered 2020-11-17: 40 mg via INTRA_ARTICULAR

## 2020-11-17 NOTE — Progress Notes (Signed)
[facility]: Attending Note: I have reviewed the chart, discussed wit the Sports Medicine Fellow. I agree with assessment and treatment plan as detailed in the [place] note. She received excellent improvement after her last corticosteroid injection and it lasted many months so we will try another.  We discussed imaging.  She does not have any current imaging in our system.  I do not think it would change management at this time as her clinical symptoms are consistent with mild to moderate DJD.  She does not really want to get x-ray unless she needs it.

## 2020-11-17 NOTE — Progress Notes (Signed)
   PCP: Nolene Ebbs, MD  Subjective:   HPI: Patient is a 50 y.o. female here for evaluation of right knee pain.  She has been seen here before for this on 01/26/2020, at that time she was having gradually progressive knee pain, diagnosed with early OA.  She was given a corticosteroid injection and feels that it was very helpful until recently when she started having pain again.  No new trauma or injury, no numbness or tingling or weakness.  The pain is along both medial lateral joint lines.  She works at [employer] and is on her feet all day and does a lot of deep squatting which aggravates her pain.   Review of Systems:  Per HPI.   [place], medications and smoking status reviewed.      Objective:  Physical Exam:  No flowsheet data found.   Gen: awake, alert, NAD, comfortable in exam room Pulm: breathing unlabored  Right knee  Inspection: Bilateral knees without evidence of erythema, ecchymosis, swelling, edema. No effusion present  Active ROM: Intact. 0-150d.   Strength: 5/5 strength to resisted flexion/extension without pain  Patella: Negative patellar grind. No patellar facet tenderness. No apprehension. No proximal or distal patellar tendon tenderness to palpation. No quad tendon tenderness to palpation.  Tibia: No tibial plateau, tibial tuberosity tenderness.  Joint line: + Medial and lateral joint line tenderness Popliteal: No popliteal tenderness to the insertional gastroc. No insertional biceps femoris, semimembranosis, semitendinosis tenderness.  McMurrays/Thessaly's: Mildly positive for pain both medially and laterally, no catching or locking Lachmans: Stable bilaterally with firm endpoint  Anterior/Posterior drawer: Stable bilaterally Varus/valgus stress at 0, 15d: Negative for pain, laxity    Assessment & Plan:  1.  Right knee OA Patient with signs and is most consistent with knee osteoarthritis.  On review of our records, we do not have any imaging of her knee, however  patient reports that she did have x-rays which showed mild to moderate osteoarthritis previously.  No indication for new imaging today.  Plan: -Right knee CSI performed today -Discussed PT, patient would like to hold off on this for now -Continue oral ibuprofen/Tylenol as needed -Follow-up as needed based on pain  Procedure note: After informed written consent timeout was performed, patient was seated on exam table. Right knee was prepped with alcohol swab and utilizing anteromedial approach, patient's right knee was injected intraarticularly with 3:1 bupivicaine: depomedrol. Patient tolerated the procedure well without immediate complications.    Dagoberto Ligas, MD [facility] Fellow 11/17/2020 11:11 AM

## 2021-02-23 ENCOUNTER — Other Ambulatory Visit: Payer: Self-pay

## 2021-02-23 ENCOUNTER — Encounter: Payer: Self-pay | Admitting: Family Medicine

## 2021-02-23 ENCOUNTER — Ambulatory Visit (INDEPENDENT_AMBULATORY_CARE_PROVIDER_SITE_OTHER): Payer: 59 | Admitting: Family Medicine

## 2021-02-23 VITALS — Ht 61.0 in

## 2021-02-23 DIAGNOSIS — M25561 Pain in right knee: Secondary | ICD-10-CM | POA: Diagnosis not present

## 2021-02-23 DIAGNOSIS — M25562 Pain in left knee: Secondary | ICD-10-CM | POA: Diagnosis not present

## 2021-02-23 MED ORDER — METHYLPREDNISOLONE ACETATE 40 MG/ML IJ SUSP
40.0000 mg | Freq: Once | INTRAMUSCULAR | Status: AC
  Administered 2021-02-23: 40 mg via INTRA_ARTICULAR

## 2021-02-23 NOTE — Assessment & Plan Note (Signed)
B CSI 01/2021

## 2021-02-23 NOTE — Progress Notes (Signed)
PCP: Nolene Ebbs, MD  Subjective:   HPI: Patient is a 50 y.o. female here for evaluation of b/l knee pain.  Patient states that both of her knees have been bothering her recently, however her left more so than her right.  She does have a history of bilateral knee osteoarthritis.  I cannot see any recent x-rays of her knees on [facility], however per patient report her x-rays did recently show mild to moderate osteoarthritis in the past a few years ago.  She was seen back in May on 11/17/2020 and had an injection of cortisone into the right knee, she states this provide her relief for about 3 months, as both of her knees only started bothering her about a few weeks ago.  She denies any new injury or inciting event.  The pain is throughout the knee, but more so on the left medial knee and right lateral knee.  The patient does work at [employer] and her job requires quite a bit of bending down and up and down work, which exacerbates her knee pain.  She denies any redness, ecchymosis or gross swelling today.  Her left knee has been swollen a few days ago after an active day at work.  Past Medical History:  Diagnosis Date   Ankle mass, left 07/30/2017   Blood pressure elevated without history of HTN 07/30/2017   Breast mass, left 07/2017   Breast mass, right 07/30/2017   Class 1 obesity due to excess calories with serious comorbidity and body mass index (BMI) of 33.0 to 33.9 in adult 07/30/2017   Diabetes mellitus without complication ([place])    Essential hypertension 06/21/2019   Hematuria, microscopic 07/30/2017   Hot flashes due to surgical menopause 12/28/2017   Hyperlipidemia    Menometrorrhagia 07/30/2017   Migraines    Pure hypercholesterolemia 07/30/2017   Tobacco abuse 07/30/2017   Type 2 diabetes mellitus with microalbuminuria, without long-term current use of insulin ([place]) 07/30/2017   Uncontrolled type 2 diabetes mellitus with hyperglycemia ([place]) 08/26/2019   Vaginal discharge 08/26/2019     Ht '5\' 1"'$   (1.549 m)   LMP 09/21/2017 (Exact Date)   BMI 35.90 kg/m   [place] Adult Exercise 02/23/2021  Frequency of aerobic exercise (# of days/week) 3  Average time in minutes 5  Frequency of strengthening activities (# of days/week) 1    No flowsheet data found.  Review of Systems: See HPI above.     Objective:  Physical Exam:  Gen: Well-appearing, in no acute distress; non-toxic CV: Regular Rate. Well-perfused. Warm.  Resp: Breathing unlabored on room air; no wheezing. Psych: Fluid speech in conversation; appropriate affect; normal thought process Neuro: Sensation intact throughout. No gross coordination deficits.  MSK:   - Bilateral knees: Inspection was negative for erythema, ecchymosis and effusion.  There is no obvious bony abnormalities or signs of excessive osteophyte development.  The left knee has medial joint line tenderness, the right knee has some lateral joint line tenderness.  No significant TTP over the superior distal pole of the patella.  There is crepitus of the knee with active and passive extension.  She does have full range of motion from 0-135 degrees.  5/5 strength of bilateral knees in all directions.  Neurovascular intact bilaterally.  There is pain with McMurray's of the left knee and valgus strain, however no audible click or pop.  No instability at 0 and 30 degrees with varus or valgus stress, negative anterior/posterior drawer bilaterally.     Assessment &  Plan:  1. Bilateral knee pain in setting of knee osteoarthritis  Procedure: After informed written consent timeout was performed, patient was seated on exam table. The patient's left knee was prepped with Betadine and alcohol swab and utilizing anteromedial approach, the patient's knee was injected intraarticularly with 3:1 lidocaine: depomedrol. Patient tolerated the procedure well without immediate complications.  Procedure: After informed written consent timeout was performed, patient was  seated on exam table. The patient's right knee was prepped with Betadine and alcohol swab and utilizing anteromedial approach, the patient's knee was injected intraarticularly with 3:1 lidocaine: depomedrol. Patient tolerated the procedure well without immediate complications.  Plan:  - CS injections into bilateral knees performed today, R/B/I discussed - May continue Tylenol/ibuprofen as needed - RICE therapy for any flareups - We did discuss sometime in the near future repeating the x-rays do show overall progression of her knee osteoarthritis - Follow-up as needed  Elba Barman, DO PGY-4, Sports Medicine Fellow [place]

## 2021-02-23 NOTE — Progress Notes (Signed)
[facility]: Attending Note: I have reviewed the chart, discussed wit the Sports Medicine Fellow. I agree with assessment and treatment plan as detailed in the [place] note. Good relief from last injection which was unilateral. Hopefully this will last also. We have no imaging currently but she is not likely to need surgical intervention in near future, so I think we can hold off unless her CSI frequency changes.

## 2021-04-04 ENCOUNTER — Other Ambulatory Visit: Payer: Self-pay | Admitting: Internal Medicine

## 2021-04-04 DIAGNOSIS — Z1231 Encounter for screening mammogram for malignant neoplasm of breast: Secondary | ICD-10-CM

## 2021-04-26 ENCOUNTER — Other Ambulatory Visit: Payer: Self-pay

## 2021-04-26 ENCOUNTER — Ambulatory Visit
Admission: RE | Admit: 2021-04-26 | Discharge: 2021-04-26 | Disposition: A | Discharge status: Home / Self Care | Payer: 59 | Source: Ambulatory Visit | Attending: Internal Medicine | Admitting: Internal Medicine

## 2021-04-26 DIAGNOSIS — Z1231 Encounter for screening mammogram for malignant neoplasm of breast: Secondary | ICD-10-CM

## 2021-05-12 ENCOUNTER — Emergency Department (HOSPITAL_COMMUNITY)
Admission: EM | Admit: 2021-05-12 | Discharge: 2021-05-12 | Disposition: A | Discharge status: Home / Self Care | Payer: 59 | Attending: Emergency Medicine | Admitting: Emergency Medicine

## 2021-05-12 ENCOUNTER — Other Ambulatory Visit: Payer: Self-pay

## 2021-05-12 ENCOUNTER — Encounter (HOSPITAL_COMMUNITY): Payer: Self-pay

## 2021-05-12 DIAGNOSIS — Z794 Long term (current) use of insulin: Secondary | ICD-10-CM | POA: Insufficient documentation

## 2021-05-12 DIAGNOSIS — K029 Dental caries, unspecified: Secondary | ICD-10-CM | POA: Diagnosis not present

## 2021-05-12 DIAGNOSIS — K0889 Other specified disorders of teeth and supporting structures: Secondary | ICD-10-CM | POA: Diagnosis present

## 2021-05-12 DIAGNOSIS — Z79899 Other long term (current) drug therapy: Secondary | ICD-10-CM | POA: Diagnosis not present

## 2021-05-12 DIAGNOSIS — Z7984 Long term (current) use of oral hypoglycemic drugs: Secondary | ICD-10-CM | POA: Diagnosis not present

## 2021-05-12 DIAGNOSIS — E1169 Type 2 diabetes mellitus with other specified complication: Secondary | ICD-10-CM | POA: Insufficient documentation

## 2021-05-12 DIAGNOSIS — F17210 Nicotine dependence, cigarettes, uncomplicated: Secondary | ICD-10-CM | POA: Diagnosis not present

## 2021-05-12 DIAGNOSIS — K0381 Cracked tooth: Secondary | ICD-10-CM | POA: Insufficient documentation

## 2021-05-12 DIAGNOSIS — I10 Essential (primary) hypertension: Secondary | ICD-10-CM | POA: Insufficient documentation

## 2021-05-12 MED ORDER — AMOXICILLIN-POT CLAVULANATE 875-125 MG PO TABS: 1.0000 | ORAL_TABLET | Freq: Two times a day (BID) | ORAL | 0 refills | Status: DC

## 2021-05-12 NOTE — ED Triage Notes (Signed)
Patient complains of left upper dental pain for the past few days after reporting that tooth has been broken for a while

## 2021-05-12 NOTE — ED Provider Notes (Signed)
[facility] EMERGENCY DEPARTMENT Provider Note   CSN: [id number] Arrival date & time: 05/12/21  1041     History No chief complaint on file.   Jody Duke is a 50 y.o. female.  Jody Duke is a 50 y.o. female with past medical history as detailed below, presents to the ED for evaluation of left upper dental pain that has been present for the past 3 to 4 days.  She reports a few months ago she had a tooth that broke in this region and since then has intermittently had issues with pain.  Over the past few days she has had worsening pain.  No drainage from the area, no facial swelling, no pain or swelling under the tongue or difficulty swallowing.  No fevers or chills, no nausea or vomiting.  No difficulty swallowing.  She has been taking ibuprofen but continues to have pain.  Reports about 2 weeks ago she was on penicillin prescribed by her PCP for this, does not have a dentist she follows with.  The history is provided by the patient.      Past Medical History:  Diagnosis Date   Ankle mass, left 07/30/2017   Blood pressure elevated without history of HTN 07/30/2017   Breast mass, left 07/2017   Breast mass, right 07/30/2017   Class 1 obesity due to excess calories with serious comorbidity and body mass index (BMI) of 33.0 to 33.9 in adult 07/30/2017   Diabetes mellitus without complication ([place])    Essential hypertension 06/21/2019   Hematuria, microscopic 07/30/2017   Hot flashes due to surgical menopause 12/28/2017   Hyperlipidemia    Menometrorrhagia 07/30/2017   Migraines    Pure hypercholesterolemia 07/30/2017   Tobacco abuse 07/30/2017   Type 2 diabetes mellitus with microalbuminuria, without long-term current use of insulin ([place]) 07/30/2017   Uncontrolled type 2 diabetes mellitus with hyperglycemia ([place]) 08/26/2019   Vaginal discharge 08/26/2019    Patient Active Problem List   Diagnosis Date Noted   Acute low back pain 07/14/2020   Neck pain  07/14/2020   Pain in both knees 01/26/2020   Vaginal discharge 08/26/2019   Uncontrolled type 2 diabetes mellitus with hyperglycemia ([place]) 08/26/2019   Essential hypertension 06/21/2019   Hot flashes due to surgical menopause 12/28/2017   Pure hypercholesterolemia 07/30/2017   Type 2 diabetes mellitus with microalbuminuria, without long-term current use of insulin ([place]) 07/30/2017   Hematuria, microscopic 07/30/2017   Menometrorrhagia 07/30/2017   Ankle mass, left 07/30/2017   Blood pressure elevated without history of HTN 07/30/2017   Tobacco abuse 07/30/2017   Breast mass, right 07/30/2017   Class 1 obesity due to excess calories with serious comorbidity and body mass index (BMI) of 33.0 to 33.9 in adult 07/30/2017    Past Surgical History:  Procedure Laterality Date   ANKLE SURGERY Left 10/15/2017   BREAST BIOPSY Left 07/24/2017   Procedure: LEFT BREAST MASS EXCISIONAL BIOPSY;  Surgeon: Rolm Bookbinder, MD;  Location: [facility];  Service: General;  Laterality: Left;   DEBULKING N/A 10/02/2017   Procedure: DEBULKING;  Surgeon: Isabel Caprice, MD;  Location: [facility];  Service: Gynecology;  Laterality: N/A;   FOOT GANGLION EXCISION Left    age 55   HYSTERECTOMY ABDOMINAL WITH SALPINGO-OOPHORECTOMY Bilateral 10/02/2017   Procedure: HYSTERECTOMY ABDOMINAL WITH SALPINGO-OOPHORECTOMY;  Surgeon: Isabel Caprice, MD;  Location: [facility];  Service: Gynecology;  Laterality: Bilateral;  bilateral salpingo-oophorectomy with possible total abdominal hysterectomy   LAPAROTOMY  WITH STAGING N/A 10/02/2017   Procedure: LAPAROTOMY WITH STAGING/OMENTECTOMY;  Surgeon: Isabel Caprice, MD;  Location: [facility];  Service: Gynecology;  Laterality: N/A;   RE-EXCISION OF BREAST LUMPECTOMY Left 08/25/2017   Procedure: LEFT RE-EXCISION OF BREAST [place];  Surgeon: Rolm Bookbinder, MD;  Location: [facility];  Service: General;  Laterality: Left;     OB History      Gravida  0   Para  0   Term  0   Preterm  0   AB  0   Living  0      SAB  0   IAB  0   Ectopic  0   Multiple  0   Live Births  0           Family History  Problem Relation Age of Onset   Hyperlipidemia Mother    Alcohol abuse Mother    Cirrhosis Mother    Diabetes Father    Hyperlipidemia Father    Hypertension Father    Stroke Father    Heart disease Father 9       CAD with stenting multiple   Diabetes Sister    Hyperlipidemia Sister    Hypertension Sister    Cancer Sister        Stomach   Diabetes Sister    Hyperlipidemia Sister    Hypertension Sister    Breast cancer Paternal Aunt    Pancreatic cancer Paternal Aunt    Breast cancer Paternal Aunt    Pancreatic cancer Paternal Aunt    Breast cancer Maternal Grandmother        Post menopausal   Breast cancer Paternal Grandmother    Hyperlipidemia Brother    Hypertension Brother     Social History   Tobacco Use   Smoking status: Every Day    Packs/day: 0.25    Years: 30.00    Pack years: 7.50    Types: Cigarettes   Smokeless tobacco: Never   Tobacco comments:    1 daily  Vaping Use   Vaping Use: Never used  Substance Use Topics   Alcohol use: No   Drug use: No    Home Medications Prior to Admission medications   Medication Sig Start Date End Date Taking? Authorizing Provider  amoxicillin-clavulanate (AUGMENTIN) 875-125 MG tablet Take 1 tablet by mouth 2 (two) times daily. One po bid x 7 days 05/12/21  Yes Benedetto Goad N, PA-C  atorvastatin (LIPITOR) 40 MG tablet TAKE 1 TABLET BY MOUTH EVERY DAY 11/11/19   Jacelyn Pi, Lilia Argue, MD  busPIRone (BUSPAR) 10 MG tablet Take 10 mg by mouth 3 (three) times daily. 01/12/20   [provider]  estradiol (ESTRACE) 1 MG tablet Take 1 tablet (1 mg total) by mouth daily. 08/10/20   Marny Lowenstein A, NP  FARXIGA 10 MG TABS tablet TAKE 10 MG BY MOUTH DAILY BEFORE BREAKFAST. 12/17/19   Daleen Squibb, MD  gabapentin (NEURONTIN) 600 MG  tablet Take 1 tablet (600 mg total) by mouth 3 (three) times daily. 09/29/19 12/28/19  Wendall Mola, NP  losartan-hydrochlorothiazide (HYZAAR) 50-12.5 MG tablet Take 1 tablet by mouth daily. 08/26/19   Wendall Mola, NP  metFORMIN (GLUCOPHAGE) 1000 MG tablet TAKE 1 TABLET BY MOUTH TWICE A DAY WITH MEALS 10/07/19   Jacelyn Pi, Lilia Argue, MD  metoprolol tartrate (LOPRESSOR) 100 MG tablet Take one tablet by mouth 2 hours before ct scan Patient not taking: Reported on 12/20/2019  11/25/19   Burnell Blanks, MD  TRULICITY 8.52 DP/8.2UM SOPN Inject 0.75 mg into the skin once a week. 01/04/20   [provider]  venlafaxine (EFFEXOR) 75 MG tablet Take 75 mg by mouth daily.    [provider]  venlafaxine XR (EFFEXOR XR) 75 MG 24 hr capsule Take 1 capsule (75 mg total) by mouth daily with breakfast. 09/29/19 10/29/19  Wendall Mola, NP  Vitamin D, Ergocalciferol, (DRISDOL) 1.25 MG (50000 UNIT) CAPS capsule Take 50,000 Units by mouth once a week. 12/28/19   [provider]    Allergies    Percocet [oxycodone-acetaminophen]  Review of Systems   Review of Systems  Constitutional:  Negative for chills and fever.  HENT:  Positive for dental problem. Negative for facial swelling and trouble swallowing.   Gastrointestinal:  Negative for vomiting.  Musculoskeletal:  Negative for neck pain and neck stiffness.  All other systems reviewed and are negative.  Physical Exam Updated Vital Signs BP 129/83   Pulse 96   Temp 98.3 F (36.8 C) (Oral)   Resp 18   LMP 09/21/2017 (Exact Date)   SpO2 100%   Physical Exam Vitals and nursing note reviewed.  Constitutional:      General: She is not in acute distress.    Appearance: Normal appearance. She is well-developed. She is not ill-appearing or diaphoretic.  HENT:     Head: Normocephalic and atraumatic.     Mouth/Throat:     Comments: Multiple teeth are in poor dentition, there is a left upper tooth broken off at  the gum level with surrounding decay, mild erythema of gums but no obvious drainable abscess, no fluctuance over the roof of the mouth, posterior oropharynx is clear, no sublingual tenderness, normal phonation, no trismus, tolerating secretions Eyes:     General:        Right eye: No discharge.        Left eye: No discharge.  Pulmonary:     Effort: Pulmonary effort is normal. No respiratory distress.  Musculoskeletal:        General: No deformity.     Cervical back: Neck supple. No tenderness.  Skin:    General: Skin is warm and dry.  Neurological:     Mental Status: She is alert and oriented to person, place, and time.     Coordination: Coordination normal.  Psychiatric:        Mood and Affect: Mood normal.        Behavior: Behavior normal.    ED Results / Procedures / Treatments   Labs (all labs ordered are listed, but only abnormal results are displayed) Labs Reviewed - No data to display  EKG None  Radiology No results found.  Procedures Procedures   Medications Ordered in ED Medications - No data to display  ED Course  I have reviewed the triage vital signs and the nursing notes.  Pertinent labs & imaging results that were available during my care of the patient were reviewed by me and considered in my medical decision making (see chart for details).    MDM Rules/Calculators/A&P                           Patient with toothache.  No gross abscess.  Exam unconcerning for Ludwig's angina or spread of infection.  Will treat with Augmentin and anti-inflammatories medicine.  Urged patient to follow-up with dentist, dental resources provided  Final Clinical Impression(s) /  ED Diagnoses Final diagnoses:  Pain, dental    Rx / DC Orders ED Discharge Orders          Ordered    amoxicillin-clavulanate (AUGMENTIN) 875-125 MG tablet  2 times daily        05/12/21 1350             Benedetto Goad [place], [place] 05/12/21 1526    Charlesetta Shanks, MD 05/17/21 [phone]

## 2021-05-12 NOTE — Discharge Instructions (Signed)
Please take entire course of antibiotics as directed.  Continue using ibuprofen 800 mg every 8 hours and Tylenol 1000 mg every 6 hours, as well as Orajel for pain.  You will need to follow-up with your dentist for continued management of this.  Return to the emergency department for fevers, swelling or pain under the tongue or in the neck, difficulty breathing or swallowing or any other new or concerning symptoms.

## 2021-06-01 ENCOUNTER — Ambulatory Visit: Payer: 59 | Admitting: Family Medicine

## 2021-06-08 ENCOUNTER — Ambulatory Visit (INDEPENDENT_AMBULATORY_CARE_PROVIDER_SITE_OTHER): Payer: 59 | Admitting: Family Medicine

## 2021-06-08 VITALS — BP 117/75 | Ht 62.0 in | Wt 186.0 lb

## 2021-06-08 DIAGNOSIS — M25562 Pain in left knee: Secondary | ICD-10-CM | POA: Diagnosis not present

## 2021-06-08 DIAGNOSIS — G8929 Other chronic pain: Secondary | ICD-10-CM | POA: Diagnosis not present

## 2021-06-08 DIAGNOSIS — M47812 Spondylosis without myelopathy or radiculopathy, cervical region: Secondary | ICD-10-CM | POA: Diagnosis not present

## 2021-06-08 DIAGNOSIS — M25561 Pain in right knee: Secondary | ICD-10-CM

## 2021-06-08 MED ORDER — METHYLPREDNISOLONE ACETATE 40 MG/ML IJ SUSP
40.0000 mg | Freq: Once | INTRAMUSCULAR | Status: AC
  Administered 2021-06-08: 40 mg via INTRA_ARTICULAR

## 2021-06-08 NOTE — Progress Notes (Signed)
Jody Duke - 50 y.o. female MRN [id number]  Date of birth: 1970-07-15    SUBJECTIVE:      Chief Complaint:/ HPI:    #1.  Knee pain.  Corticosteroid injection in August really helped but right now the right knee is bothering her again.  Has lots to do over the holidays wants to consider corticosteroid injection.  No locking or giving way.  Pain with activity such as standing or walking.  Some night pain. 2.  Bilateral upper extremity paresthesias.  Known cervical spine arthropathy.  She is having some numbness and weakness in bilateral hands that is intermittent.  Primarily on the thumb side.  Feels like she is dropping things.  Right-hand-dominant.  Both sides are affected however.   OBJECTIVE: BP 117/75   Ht 5\' 2"  (1.575 m)   Wt 186 lb (84.4 kg)   LMP 09/21/2017 (Exact Date)   BMI 34.02 kg/m   Physical Exam:  Vital signs are reviewed. GENERAL: Well-developed female no acute distress KNEE: Right.  Full range of motion of flexion extension.  Ligamentously intact.  She has tenderness to palpation at the medial and lateral joint line but medial pain is worse.  Popliteal space is benign.  Calf is soft.  Distally neurovascularly intact. NECK: Full range of motion flexion extension, lateral rotation.  No tenderness to palpation. EXTREMITY: Upper bilateral 5 out of 5 strength except for C5-6 testing bilaterally.  Pincer grip is quite weak.  There is no muscle wasting noted in the hands. NEURO: DTRs at the elbow and wrist are normal.  Soft touch sensation is intact bilateral upper extremity.  Imaging:  C spine 11/2018: Bulky anterior spurs and calcification of the anterior longitudinal ligament identified greatest at C4-C5 and to lesser degree C6-C7.  Vertebral body heights and disc space heights maintained.  No fracture, subluxation, or bone destruction.  Reversal of cervical lordosis question muscle spasm.  Uncovertebral hypertrophy narrows the RIGHT C3-C4 neural foramen significantly,  with only mild impingement by uncovertebral spurs noted at C4-C5 and C5-C6.  LEFT foramina grossly patent.  C1-C2 alignment normal. IMPRESSION: Question muscle spasm.  Degenerative disc disease changes of the cervical spine as above with note of more prominent neural foraminal encroachment by uncovertebral spurs at RIGHT C3-C4  MRI 12/2018: IMPRESSION: 1. C3-4 disc extrusion with mild ventral cord deformity. Right foraminal impingement at this level due to uncovertebral spurring. 2. C4-5 mild left cord mass effect due to disc osteophyte complex. 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 3. C5-6 moderate right foraminal narrowing 4. C6-7 left paracentral protrusion which could affect the intradural C7 nerve root  Epidural steroid injections: C7-T1 08/2020 and 08/2020 Injection @T -T2 07/2020  ASSESSMENT & PLAN:  See problem based charting & AVS for pt instructions. Pain in both knees Right knee pain today.  We will perform corticosteroid injection.  Follow-up as needed. PROCEDURE: INJECTION: Patient was given informed consent, signed copy in the chart. Appropriate time out was taken. Area prepped and draped in usual sterile fashion. Ethyl chloride was  used for local anesthesia. A 21 gauge 1 1/2 inch needle was used..  1 cc of methylprednisolone 40 mg/ml plus 4 cc of 1% lidocaine without epinephrine was injected into the right knee using a(n) anterior medial approach.   The patient tolerated the procedure well. There were no complications. Post procedure instructions were given.   Arthropathy of cervical spine Known arthropathy of C-spine.  Some paresthesias.  Some weakness in pincer grip bilaterally.  I think at this point she needs evaluation by neurosurgery.  Unclear if she needs a new MRI but I will let them determine.  Referral made.  She will follow-up with me after that.

## 2021-06-11 DIAGNOSIS — M47812 Spondylosis without myelopathy or radiculopathy, cervical region: Secondary | ICD-10-CM | POA: Insufficient documentation

## 2021-06-11 NOTE — Assessment & Plan Note (Addendum)
Right knee pain today.  We will perform corticosteroid injection.  Follow-up as needed. PROCEDURE: INJECTION: Patient was given informed consent, signed copy in the chart. Appropriate time out was taken. Area prepped and draped in usual sterile fashion. Ethyl chloride was  used for local anesthesia. A 21 gauge 1 1/2 inch needle was used..  1 cc of methylprednisolone 40 mg/ml plus 4 cc of 1% lidocaine without epinephrine was injected into the right knee using a(n) anterior medial approach.   The patient tolerated the procedure well. There were no complications. Post procedure instructions were given.

## 2021-06-11 NOTE — Assessment & Plan Note (Signed)
Known arthropathy of C-spine.  Some paresthesias.  Some weakness in pincer grip bilaterally.  I think at this point she needs evaluation by neurosurgery.  Unclear if she needs a new MRI but I will let them determine.  Referral made.  She will follow-up with me after that.

## 2021-08-05 ENCOUNTER — Encounter: Payer: Self-pay | Admitting: Family Medicine

## 2021-08-13 ENCOUNTER — Ambulatory Visit: Payer: 59 | Admitting: Nurse Practitioner

## 2021-08-13 NOTE — Progress Notes (Signed)
Kaaliyah Kita 1971/02/26 [id number]   History:  51 y.o. G0 presents for annual exam. Postmenopausal - on ERT. S/P 2019 TAH BSO for menorrhagia and bilateral cysts. Still having some night sweats. Normal pap and mammogram history. T2DM, HTN, HLD managed by PCP. She restarted smoking last year and currently smokes about 5 cigarettes per day, skips some days.   Gynecologic History Patient's last menstrual period was 09/21/2017 (exact date).   Contraception/Family planning: status post hysterectomy Sexually active: No  Health Maintenance Last Pap: 06/16/2017. Results were: Normal Last mammogram: 04/26/2021. Results were: Normal Last colonoscopy: N/A Last Dexa: Not indicated  Past medical history, past surgical history, family history and social history were all reviewed and documented in the EPIC chart. Works at [employer].  ROS:  A ROS was performed and pertinent positives and negatives are included.  Exam:  Vitals:   08/14/21 0753  BP: 124/80  Weight: 189 lb (85.7 kg)  Height: 5\' 2"  (1.575 m)    Body mass index is 34.57 kg/m.  General appearance:  Normal Thyroid:  Symmetrical, normal in size, without palpable masses or nodularity. Respiratory  Auscultation:  Clear without wheezing or rhonchi Cardiovascular  Auscultation:  Regular rate, without rubs, murmurs or gallops  Edema/varicosities:  Not grossly evident Abdominal  Soft,nontender, without masses, guarding or rebound.  Liver/spleen:  No organomegaly noted  Hernia:  None appreciated  Skin  Inspection:  Grossly normal   Breasts: Examined lying and sitting.   Right: Without masses, retractions, discharge or axillary adenopathy.   Left: Without masses, retractions, discharge or axillary adenopathy. Genitourinary   Inguinal/mons:  Normal without inguinal adenopathy  External genitalia:  pearly appearance from clitoral hood to rectum c/w lichen changes  BUS/Urethra/Skene's glands:  Normal  Vagina:  Normal appearing  with normal color and discharge, no lesions  Cervix:  Absent  Uterus:  Absent  Adnexa/parametria:     Rt: Normal in size, without masses or tenderness.   Lt: Normal in size, without masses or tenderness.  Anus and perineum: Erythema, thin, somewhat pearly appearance  Digital rectal exam: Normal sphincter tone without palpated masses or tenderness  Patient informed chaperone available to be present for breast and pelvic exam. Patient has requested no chaperone to be present. Patient has been advised what will be completed during breast and pelvic exam.   Assessment/Plan:  51 y.o. G0 for annual exam.   Well woman exam with routine gynecological exam - Education provided on SBEs, importance of preventative screenings, current guidelines, high calcium diet, regular exercise, and multivitamin daily. Labs with PCP.   Hormone replacement therapy (HRT) - Plan: estradiol (ESTRACE) 1 MG tablet daily. She is aware of the risks for blood clots, stroke, heart attack, and breast cancer and her increased risk with smoking. She would like to continue. Still having some night sweats - Taking Estradiol, Effexor, Clonidine, and Buspar. Recommend sleeping in less clothing, using fans, avoiding triggering foods, and smoking cessation. Not comfortable increasing Estradiol due to smoking status. Refill x 1 year provided.  Lichen simplex chronicus - Plan: clobetasol ointment (TEMOVATE) 0.05 %. She will use more often initially - BID x 1 week, daily x 1 week, every other day x 1 week, then twice weekly. Avoiding scratching.   Screening for cervical cancer - Normal Pap history.  No longer screening per guidelines.   Screening for breast cancer - Normal mammogram history.  Continue annual screenings.  Normal breast exam today.  Screening for colon cancer - Has not had screening  colonoscopy. PCP is process of scheduling this.   Screening for osteoporosis - Smoker for 30 years. Recommend starting screenings earlier than  41.   Follow up in 1 year for annual.      Tamela Gammon [facility], 8:23 AM 08/14/2021

## 2021-08-14 ENCOUNTER — Encounter: Payer: Self-pay | Admitting: Nurse Practitioner

## 2021-08-14 ENCOUNTER — Ambulatory Visit (INDEPENDENT_AMBULATORY_CARE_PROVIDER_SITE_OTHER): Payer: 59 | Admitting: Nurse Practitioner

## 2021-08-14 ENCOUNTER — Encounter: Payer: Self-pay | Admitting: Physician Assistant

## 2021-08-14 ENCOUNTER — Other Ambulatory Visit: Payer: Self-pay

## 2021-08-14 VITALS — BP 124/80 | Ht 62.0 in | Wt 189.0 lb

## 2021-08-14 DIAGNOSIS — Z01419 Encounter for gynecological examination (general) (routine) without abnormal findings: Secondary | ICD-10-CM | POA: Diagnosis not present

## 2021-08-14 DIAGNOSIS — Z79890 Hormone replacement therapy (postmenopausal): Secondary | ICD-10-CM | POA: Diagnosis not present

## 2021-08-14 DIAGNOSIS — L280 Lichen simplex chronicus: Secondary | ICD-10-CM

## 2021-08-14 MED ORDER — CLOBETASOL PROPIONATE 0.05 % EX OINT: 1.0000 "application " | TOPICAL_OINTMENT | Freq: Two times a day (BID) | CUTANEOUS | 2 refills | Status: DC

## 2021-08-14 MED ORDER — ESTRADIOL 1 MG PO TABS: 1.0000 mg | ORAL_TABLET | Freq: Every day | ORAL | 3 refills | Status: DC

## 2021-08-17 ENCOUNTER — Ambulatory Visit
Admission: RE | Admit: 2021-08-17 | Discharge: 2021-08-17 | Disposition: A | Discharge status: Home / Self Care | Payer: 59 | Source: Ambulatory Visit | Attending: Family Medicine | Admitting: Family Medicine

## 2021-08-17 ENCOUNTER — Ambulatory Visit (INDEPENDENT_AMBULATORY_CARE_PROVIDER_SITE_OTHER): Payer: 59 | Admitting: Family Medicine

## 2021-08-17 VITALS — BP 130/60 | Ht 61.0 in | Wt 186.0 lb

## 2021-08-17 DIAGNOSIS — G8929 Other chronic pain: Secondary | ICD-10-CM | POA: Diagnosis not present

## 2021-08-17 DIAGNOSIS — M25562 Pain in left knee: Secondary | ICD-10-CM | POA: Diagnosis not present

## 2021-08-17 DIAGNOSIS — M25561 Pain in right knee: Secondary | ICD-10-CM

## 2021-08-17 DIAGNOSIS — M5010 Cervical disc disorder with radiculopathy, unspecified cervical region: Secondary | ICD-10-CM | POA: Diagnosis not present

## 2021-08-17 MED ORDER — METHYLPREDNISOLONE ACETATE 40 MG/ML IJ SUSP
40.0000 mg | Freq: Once | INTRAMUSCULAR | Status: AC
  Administered 2021-08-17: 40 mg via INTRA_ARTICULAR

## 2021-08-17 NOTE — Assessment & Plan Note (Signed)
She now has the phone number for the neurosurgeon again and will call to have this appointment scheduled.  We will hold off on any epidural steroid injections until she is seen by them.  Most concerning findings are her reported weakness and significant paresthesias, recommended prompt neurosurgery evaluation given the severity and chronicity of her symptoms to decrease long-term complications.

## 2021-08-17 NOTE — Patient Instructions (Signed)
I have put in for x rays of your knees. I will send you a note about them. Let me know what [facility] says!

## 2021-08-17 NOTE — Assessment & Plan Note (Signed)
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  Discussed risk and benefits of a corticosteroid injection today and she opts to proceed with bilateral corticosteroid injections.  We will have her follow-up as needed in regards to this.  Procedure performed: right knee intraarticular corticosteroid injection; palpation guided  Consent obtained and verified. Time-out conducted. Noted no overlying erythema, induration, or other signs of local infection. The right medial joint space was palpated and marked. The overlying skin was prepped in a sterile fashion. Topical analgesic spray: Ethyl chloride. Joint: right knee Needle: 25 gauge, 1.5 inch Completed without difficulty. Meds: 40 mg methylrprednisolone, 4 ml 1% lidocaine without epinephrine  Procedure performed: left knee intraarticular corticosteroid injection; palpation guided  Consent obtained and verified. Time-out conducted. Noted no overlying erythema, induration, or other signs of local infection. The left medial joint space was palpated and marked. The overlying skin was prepped in a sterile fashion. Topical analgesic spray: Ethyl chloride. Joint: left knee Needle: 25 gauge, 1.5 inch Completed without difficulty. Meds: 40 mg methylrprednisolone, 4 ml 1% lidocaine without epinephrine  Advised to call if fevers/chills, erythema, induration, drainage, or persistent bleeding.

## 2021-08-17 NOTE — Progress Notes (Signed)
Jody Duke is a 51 y.o. female who presents to [facility] today for the following:  Neck pain Patient has known bilateral upper extremity paresthesias and cervical spine arthropathy She has received prior epidural steroid injections of the spine  Prior injections at C7-T1 on 08/2020 and 08/2020, also had an injection at T1-T2 on 07/2020 She has been referred to neurosurgery, but has not yet gone She is planning to go back Continues to have paresthesias right greater than left States that she is dropping things and continues to have weakness  Bilateral knee pain Acute on chronic issue for her, no recent x-rays Has done well with corticosteroid injections in the past Last injection on right on 06/08/2021 For the last few weeks has had worsening of b/l knee pain Would like repeat injections in both knees today  PMH reviewed.  ROS as above. Medications reviewed.  Exam:  BP 130/60    Ht 5\' 1"  (1.549 m)    Wt 186 lb (84.4 kg)    LMP 09/21/2017 (Exact Date)    BMI 35.14 kg/m  Gen: Well NAD MSK:  Bilateral Knees: - Inspection: no gross deformity b/l. No swelling/effusion, erythema or bruising b/l. Skin intact - Palpation: TTP medial and lateral joint lines b/l - ROM: full active ROM with extension, limited by 10 degrees in flexion b/l - Strength: 5/5 strength b/l - Neuro/vasc: NV intact distally b/l - Special Tests: - LIGAMENTS: negative anterior and posterior drawer, negative Lachman's, no MCL or LCL laxity  -- MENISCUS: equivocal McMurray's -- PF JOINT: negative patellar apprehension  Hips: slightly decreased IR b/l without pain  Neck/Back: - Inspection: no gross deformity or asymmetry, swelling or ecchymosis - Palpation: No TTP spinous process - ROM: limited ROM of the cervical spine with neck extension, rotation, flexion - pain in all directions - Strength: 5/5 wrist flexion, extension, biceps flexion, triceps extension. OK sign, interosseus strength intact  - Neuro:  sensation intact in the C5-C8 nerve root distribution b/l     No results found.   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  Discussed risk and benefits of a corticosteroid injection today and she opts to proceed with bilateral corticosteroid injections.  We will have her follow-up as needed in regards to this.  Procedure performed: right knee intraarticular corticosteroid injection; palpation guided  Consent obtained and verified. Time-out conducted. Noted no overlying erythema, induration, or other signs of local infection. The right medial joint space was palpated and marked. The overlying skin was prepped in a sterile fashion. Topical analgesic spray: Ethyl chloride. Joint: right knee Needle: 25 gauge, 1.5 inch Completed without difficulty. Meds: 40 mg methylrprednisolone, 4 ml 1% lidocaine without epinephrine  Procedure performed: left knee intraarticular corticosteroid injection; palpation guided  Consent obtained and verified. Time-out conducted. Noted no overlying erythema, induration, or other signs of local infection. The left medial joint space was palpated and marked. The overlying skin was prepped in a sterile fashion. Topical analgesic spray: Ethyl chloride. Joint: left knee Needle: 25 gauge, 1.5 inch Completed without difficulty. Meds: 40 mg methylrprednisolone, 4 ml 1% lidocaine without epinephrine  Advised to call if fevers/chills, erythema, induration, drainage, or persistent bleeding.     Cervical disc disorder with radiculopathy of cervical region She now has the phone number for the neurosurgeon again and will call to have this appointment scheduled.  We will hold off on any epidural steroid injections  until she is seen by them.  Most concerning findings are her reported weakness and significant paresthesias, recommended prompt neurosurgery evaluation given the  severity and chronicity of her symptoms to decrease long-term complications.   [place] Constable, D.O.  PGY-4 [facility]  08/17/2021 11:50 AM

## 2021-08-18 ENCOUNTER — Encounter: Payer: Self-pay | Admitting: Family Medicine

## 2021-08-18 ENCOUNTER — Encounter (INDEPENDENT_AMBULATORY_CARE_PROVIDER_SITE_OTHER): Payer: Self-pay

## 2021-08-20 NOTE — Progress Notes (Signed)
[facility]: Attending Note: I have reviewed the chart, discussed wit the Sports Medicine Fellow. I agree with assessment and treatment plan as detailed in the [place] note. Bilateral corticosteroid injections of the knees today.  She has follow-up appointment with neurosurgery regarding her neck pain.  She may need new MRI which would be happy to set up for her if she needs that done.  I would like to see her back after she has been evaluated by neurosurgery for her upper extremity paresthesias.  We will also have her get some updated x-rays of her knees.  Orders placed.

## 2021-08-22 ENCOUNTER — Encounter: Payer: Self-pay | Admitting: Nurse Practitioner

## 2021-08-22 ENCOUNTER — Other Ambulatory Visit: Payer: Self-pay | Admitting: Nurse Practitioner

## 2021-08-22 DIAGNOSIS — N760 Acute vaginitis: Secondary | ICD-10-CM

## 2021-08-22 MED ORDER — TERCONAZOLE 0.8 % VA CREA: 1.0000 | TOPICAL_CREAM | Freq: Every day | VAGINAL | 0 refills | Status: AC

## 2021-08-24 ENCOUNTER — Other Ambulatory Visit: Payer: Self-pay | Admitting: Family Medicine

## 2021-08-24 DIAGNOSIS — M47812 Spondylosis without myelopathy or radiculopathy, cervical region: Secondary | ICD-10-CM

## 2021-08-24 DIAGNOSIS — M542 Cervicalgia: Secondary | ICD-10-CM

## 2021-08-27 ENCOUNTER — Other Ambulatory Visit (INDEPENDENT_AMBULATORY_CARE_PROVIDER_SITE_OTHER): Payer: 59

## 2021-08-27 ENCOUNTER — Ambulatory Visit: Payer: 59 | Admitting: Nurse Practitioner

## 2021-08-27 ENCOUNTER — Encounter: Payer: Self-pay | Admitting: Nurse Practitioner

## 2021-08-27 VITALS — BP 114/76 | HR 97 | Ht 61.0 in | Wt 186.4 lb

## 2021-08-27 DIAGNOSIS — K625 Hemorrhage of anus and rectum: Secondary | ICD-10-CM

## 2021-08-27 DIAGNOSIS — R195 Other fecal abnormalities: Secondary | ICD-10-CM

## 2021-08-27 DIAGNOSIS — R194 Change in bowel habit: Secondary | ICD-10-CM

## 2021-08-27 LAB — CBC WITH DIFFERENTIAL/PLATELET
Basophils Absolute: 0.1 10*3/uL (ref 0.0–0.1)
Basophils Relative: 1 % (ref 0.0–3.0)
Eosinophils Absolute: 0.4 10*3/uL (ref 0.0–0.7)
Eosinophils Relative: 3.9 % (ref 0.0–5.0)
HCT: 41.1 % (ref 36.0–46.0)
Hemoglobin: 13.6 g/dL (ref 12.0–15.0)
Lymphocytes Relative: 32.9 % (ref 12.0–46.0)
Lymphs Abs: 3.2 10*3/uL (ref 0.7–4.0)
MCHC: 33.1 g/dL (ref 30.0–36.0)
MCV: 93 fl (ref 78.0–100.0)
Monocytes Absolute: 0.8 10*3/uL (ref 0.1–1.0)
Monocytes Relative: 8.4 % (ref 3.0–12.0)
Neutro Abs: 5.2 10*3/uL (ref 1.4–7.7)
Neutrophils Relative %: 53.8 % (ref 43.0–77.0)
Platelets: 344 10*3/uL (ref 150.0–400.0)
RBC: 4.42 Mil/uL (ref 3.87–5.11)
RDW: 14.4 % (ref 11.5–15.5)
WBC: 9.6 10*3/uL (ref 4.0–10.5)

## 2021-08-27 LAB — BASIC METABOLIC PANEL
BUN: 14 mg/dL (ref 6–23)
CO2: 30 mEq/L (ref 19–32)
Calcium: 10 mg/dL (ref 8.4–10.5)
Chloride: 100 mEq/L (ref 96–112)
Creatinine, Ser: 0.7 mg/dL (ref 0.40–1.20)
GFR: 101.76 mL/min (ref >60.00)
Glucose, Bld: 110 mg/dL — ABNORMAL HIGH (ref 70–99)
Potassium: 3.3 mEq/L — ABNORMAL LOW (ref 3.5–5.1)
Sodium: 138 mEq/L (ref 135–145)

## 2021-08-27 NOTE — Patient Instructions (Signed)
You have been scheduled for a colonoscopy. Please follow written instructions given to you at your visit today.  Please pick up your prep supplies at the pharmacy within the next 1-3 days. If you use inhalers (even only as needed), please bring them with you on the day of your procedure.   Your provider has requested that you go to the basement level for lab work before leaving today. Press "B" on the elevator. The lab is located at the first door on the left as you exit the elevator.   If you are age 7 or older, your body mass index should be between 23-30. Your Body mass index is 35.22 kg/m. If this is out of the aforementioned range listed, please consider follow up with your Primary Care Provider.  If you are age 75 or younger, your body mass index should be between 19-25. Your Body mass index is 35.22 kg/m. If this is out of the aformentioned range listed, please consider follow up with your Primary Care Provider.   ________________________________________________________  The [place] [facility] providers would like to encourage you to use [facility] to communicate with providers for non-urgent requests or questions.  Due to long hold times on the telephone, sending your provider a message by [facility] may be a faster and more efficient way to get a response.  Please allow 48 business hours for a response.  Please remember that this is for non-urgent requests.  _______________________________________________________   Due to recent changes in healthcare laws, you may see the results of your imaging and laboratory studies on MyChart before your provider has had a chance to review them.  We understand that in some cases there may be results that are confusing or concerning to you. Not all laboratory results come back in the same time frame and the provider may be waiting for multiple results in order to interpret others.  Please give [place] 48 hours in order for your provider to thoroughly review all the results  before contacting the office for clarification of your results.    I appreciate the  opportunity to care for you  Thank You   Roetta Sessions

## 2021-08-27 NOTE — Progress Notes (Signed)
ASSESSMENT AND PLAN    # 51 yo female with several month history of loose stools with minor rectal bleeding which could be perianal in nature related to diarrhea. Rule out IBD, colon neoplasm, microscopic colitis, functional diarrhea, SIBO   --CBC, BMET --Schedule for a colonoscopy. The risks and benefits of colonoscopy with possible polypectomy / biopsies were discussed and the patient agrees to proceed.    HISTORY OF PRESENT ILLNESS    Chief Complaint : loose stool with occasional blood  Jody Duke is a 51 y.o. female with a past medical history significant for DM, HLD, HTN, CKD ( per patient),  tobacco abuse. See PMH below for any additional history.   Patient referred by PCP for colon cancer screening. Several months ago she began having bowel changes. She has mild lower abdominal cramping and stools ranging from  liquid to soft "squirts" 3-4 times a day. A couple of months ago she began having occasional blood in stool.  Cannot correlate bowel changes with any medications or diet changes. Doesn't use artificial sweeteners. Prior to 7 months ago her normal bowel habit consisted of a solid BM 2-3 times a week with occasional constipation.    Two aunts on father's side with colon cancer, both older than 110. Patient has not undergone colon cancer screening.   She reports a 15 pound weight loss over the last several months which she she thinks may be related to diarrhea but also on Truclicity for a couple of years .Her blood sugars have been good in the 120s-150s.    Past Medical History:  Diagnosis Date   Ankle mass, left 07/30/2017   Blood pressure elevated without history of HTN 07/30/2017   Breast mass, left 07/2017   Breast mass, right 07/30/2017   Class 1 obesity due to excess calories with serious comorbidity and body mass index (BMI) of 33.0 to 33.9 in adult 07/30/2017   Diabetes mellitus without complication ([place])    Essential hypertension 06/21/2019   Hematuria,  microscopic 07/30/2017   Hot flashes due to surgical menopause 12/28/2017   Hyperlipidemia    Menometrorrhagia 07/30/2017   Migraines    Pure hypercholesterolemia 07/30/2017   Tobacco abuse 07/30/2017   Type 2 diabetes mellitus with microalbuminuria, without long-term current use of insulin ([place]) 07/30/2017   Uncontrolled type 2 diabetes mellitus with hyperglycemia ([place]) 08/26/2019   Vaginal discharge 08/26/2019     Past Surgical History:  Procedure Laterality Date   ANKLE SURGERY Left 10/15/2017   BREAST BIOPSY Left 07/24/2017   Procedure: LEFT BREAST MASS EXCISIONAL BIOPSY;  Surgeon: Rolm Bookbinder, MD;  Location: [facility];  Service: General;  Laterality: Left;   DEBULKING N/A 10/02/2017   Procedure: DEBULKING;  Surgeon: Isabel Caprice, MD;  Location: [facility];  Service: Gynecology;  Laterality: N/A;   FOOT GANGLION EXCISION Left    age 69   HYSTERECTOMY ABDOMINAL WITH SALPINGO-OOPHORECTOMY Bilateral 10/02/2017   Procedure: HYSTERECTOMY ABDOMINAL WITH SALPINGO-OOPHORECTOMY;  Surgeon: Isabel Caprice, MD;  Location: [facility];  Service: Gynecology;  Laterality: Bilateral;  bilateral salpingo-oophorectomy with possible total abdominal hysterectomy   LAPAROTOMY WITH STAGING N/A 10/02/2017   Procedure: LAPAROTOMY WITH STAGING/OMENTECTOMY;  Surgeon: Isabel Caprice, MD;  Location: [facility];  Service: Gynecology;  Laterality: N/A;   RE-EXCISION OF BREAST LUMPECTOMY Left 08/25/2017   Procedure: LEFT RE-EXCISION OF BREAST MARGINS ERAS PATHWAY;  Surgeon: Rolm Bookbinder, MD;  Location: [facility];  Service: General;  Laterality: Left;  Family History  Problem Relation Age of Onset   Hyperlipidemia Mother    Alcohol abuse Mother    Cirrhosis Mother    Diabetes Father    Hyperlipidemia Father    Hypertension Father    Stroke Father    Heart disease Father 5       CAD with stenting multiple   Stroke Sister    Diabetes Sister    Hyperlipidemia Sister     Hypertension Sister    Cancer Sister        Stomach   Diabetes Sister    Hyperlipidemia Sister    Hypertension Sister    Hyperlipidemia Brother    Hypertension Brother    Breast cancer Paternal Aunt    Pancreatic cancer Paternal Aunt    Colon cancer Paternal Aunt    Breast cancer Paternal Aunt    Pancreatic cancer Paternal Aunt    Breast cancer Maternal Grandmother        Post menopausal   Breast cancer Paternal Grandmother    Esophageal cancer Neg Hx    Stomach cancer Neg Hx    Social History   Tobacco Use   Smoking status: Every Day    Packs/day: 0.25    Years: 30.00    Pack years: 7.50    Types: Cigarettes   Smokeless tobacco: Never   Tobacco comments:    1 daily  Vaping Use   Vaping Use: Never used  Substance Use Topics   Alcohol use: No   Drug use: No   Current Outpatient Medications  Medication Sig Dispense Refill   atorvastatin (LIPITOR) 40 MG tablet TAKE 1 TABLET BY MOUTH EVERY DAY 60 tablet 0   busPIRone (BUSPAR) 10 MG tablet Take 10 mg by mouth 3 (three) times daily.     clobetasol ointment (TEMOVATE) 1.19 % Apply 1 application topically 2 (two) times daily. Initial dose: twice daily x 1 week, then daily x 1 week, then every other day x 1 week, then twice weekly 45 g 2   cloNIDine (CATAPRES) 0.1 MG tablet Take 0.1 mg by mouth at bedtime.     estradiol (ESTRACE) 1 MG tablet Take 1 tablet (1 mg total) by mouth daily. 90 tablet 3   FARXIGA 10 MG TABS tablet TAKE 10 MG BY MOUTH DAILY BEFORE BREAKFAST. 90 tablet 0   gabapentin (NEURONTIN) 600 MG tablet Take 1 tablet (600 mg total) by mouth 3 (three) times daily. 90 tablet 3   losartan-hydrochlorothiazide (HYZAAR) 50-12.5 MG tablet Take 1 tablet by mouth daily. 90 tablet 3   metFORMIN (GLUCOPHAGE) 500 MG tablet Take 500 mg by mouth 2 (two) times daily.     methocarbamol (ROBAXIN) 750 MG tablet Take 750 mg by mouth 2 (two) times daily.     TRULICITY 4.17 EY/8.1KG SOPN Inject 0.75 mg into the skin once a week.      venlafaxine XR (EFFEXOR XR) 75 MG 24 hr capsule Take 1 capsule (75 mg total) by mouth daily with breakfast. 30 capsule 3   Vitamin D, Ergocalciferol, (DRISDOL) 1.25 MG (50000 UNIT) CAPS capsule Take 50,000 Units by mouth once a week.     No current facility-administered medications for this visit.   Allergies  Allergen Reactions   Percocet [Oxycodone-Acetaminophen] Nausea And Vomiting   Ozempic (0.25 Or 0.5 Mg-Dose) [Semaglutide(0.25 Or 0.5mg -Dos)] Nausea And Vomiting     Review of Systems: All systems reviewed and negative except where noted in HPI.    PHYSICAL EXAM :    Wt  Readings from Last 3 Encounters:  08/27/21 186 lb 6 oz (84.5 kg)  08/17/21 186 lb (84.4 kg)  08/14/21 189 lb (85.7 kg)    BP 114/76    Pulse 97    Ht 5\' 1"  (1.549 m)    Wt 186 lb 6 oz (84.5 kg)    LMP 09/21/2017 (Exact Date)    BMI 35.22 kg/m  Constitutional:  pleasant obese female female in no acute distress. Psychiatric: Pleasant. Normal mood and affect. Behavior is normal. EENT: Pupils normal.  Conjunctivae are normal. No scleral icterus. Neck supple.  Cardiovascular: Normal rate, regular rhythm. No edema Pulmonary/chest: Effort normal and breath sounds normal. No wheezing, rales or rhonchi. Abdominal: Soft, nondistended, nontender. Bowel sounds active throughout. There are no masses palpable. No hepatomegaly. Rectal: No external perianal lesions seen Neurological: Alert and oriented to person place and time. Skin: Skin is warm and dry. No rashes noted.  Tye Savoy, NP  08/27/2021, 9:43 AM  Cc:  Referring Provider Nolene Ebbs, MD

## 2021-08-28 NOTE — Progress Notes (Signed)
I agree with the above note, plan 

## 2021-08-30 ENCOUNTER — Other Ambulatory Visit: Payer: Self-pay

## 2021-08-30 DIAGNOSIS — E876 Hypokalemia: Secondary | ICD-10-CM

## 2021-08-30 MED ORDER — POTASSIUM CHLORIDE ER 20 MEQ PO TBCR: 20.0000 meq | EXTENDED_RELEASE_TABLET | Freq: Every day | ORAL | 0 refills | Status: DC

## 2021-08-31 ENCOUNTER — Encounter: Payer: Self-pay | Admitting: Family Medicine

## 2021-08-31 ENCOUNTER — Ambulatory Visit
Admission: RE | Admit: 2021-08-31 | Discharge: 2021-08-31 | Disposition: A | Discharge status: Home / Self Care | Payer: 59 | Source: Ambulatory Visit | Attending: Family Medicine | Admitting: Family Medicine

## 2021-08-31 ENCOUNTER — Other Ambulatory Visit: Payer: Self-pay

## 2021-08-31 DIAGNOSIS — M47812 Spondylosis without myelopathy or radiculopathy, cervical region: Secondary | ICD-10-CM

## 2021-08-31 DIAGNOSIS — M542 Cervicalgia: Secondary | ICD-10-CM

## 2021-08-31 NOTE — Progress Notes (Signed)
Discussed with patient via MyChart.

## 2021-09-05 ENCOUNTER — Other Ambulatory Visit (INDEPENDENT_AMBULATORY_CARE_PROVIDER_SITE_OTHER): Payer: 59

## 2021-09-05 ENCOUNTER — Telehealth: Payer: Self-pay | Admitting: Family Medicine

## 2021-09-05 DIAGNOSIS — E876 Hypokalemia: Secondary | ICD-10-CM

## 2021-09-05 LAB — POTASSIUM: Potassium: 3.5 mEq/L (ref 3.5–5.1)

## 2021-09-05 LAB — MAGNESIUM: Magnesium: 1.7 mg/dL (ref 1.5–2.5)

## 2021-09-05 NOTE — Telephone Encounter (Signed)
Megan ?Can yu see about getting her scheduled for cervical spine injections at [facility]. I think she has had them before. ?THANKS! ?Dorcas Mcmurray ? ?

## 2021-09-06 ENCOUNTER — Other Ambulatory Visit: Payer: Self-pay

## 2021-09-06 ENCOUNTER — Other Ambulatory Visit: Payer: Self-pay | Admitting: Family Medicine

## 2021-09-06 ENCOUNTER — Telehealth: Payer: Self-pay

## 2021-09-06 DIAGNOSIS — M542 Cervicalgia: Secondary | ICD-10-CM

## 2021-09-06 NOTE — Telephone Encounter (Signed)
Patient returned your call, please advise. 

## 2021-09-06 NOTE — Telephone Encounter (Signed)
Spoke to patient and gave Potassium result. Let her know we sent the result to her PCP and he will be the one to monitor and treat, if necessary, going forward. ?

## 2021-09-06 NOTE — Telephone Encounter (Signed)
Order placed for repeat cervical ESI. Pt will be contacted to set up an appt. ?

## 2021-09-06 NOTE — Telephone Encounter (Signed)
Left message for patient to please call back, also sent Potassium level result to patient's PCP ?

## 2021-09-12 ENCOUNTER — Ambulatory Visit
Admission: RE | Admit: 2021-09-12 | Discharge: 2021-09-12 | Disposition: A | Discharge status: Home / Self Care | Payer: 59 | Source: Ambulatory Visit | Attending: Family Medicine | Admitting: Family Medicine

## 2021-09-12 DIAGNOSIS — M542 Cervicalgia: Secondary | ICD-10-CM

## 2021-09-12 MED ORDER — IOPAMIDOL (ISOVUE-M 300) INJECTION 61%
1.0000 mL | Freq: Once | INTRAMUSCULAR | Status: AC
  Administered 2021-09-12: 1 mL via EPIDURAL

## 2021-09-12 MED ORDER — TRIAMCINOLONE ACETONIDE 40 MG/ML IJ SUSP (RADIOLOGY)
60.0000 mg | Freq: Once | INTRAMUSCULAR | Status: AC
  Administered 2021-09-12: 60 mg via EPIDURAL

## 2021-09-12 NOTE — Discharge Instructions (Signed)

## 2021-09-24 ENCOUNTER — Telehealth: Payer: Self-pay | Admitting: Gastroenterology

## 2021-09-24 NOTE — Telephone Encounter (Signed)
Good morning Dr. Ardis Hughs, ? ?Patient called and wanted to reschedule her procedure for 3/31 at 3:00 due to being sick.  ? ? ?Patient was rescheduled for 4/26 at 3:00.   ?

## 2021-09-25 ENCOUNTER — Encounter: Payer: Self-pay | Admitting: Nurse Practitioner

## 2021-09-28 ENCOUNTER — Encounter: Payer: 59 | Admitting: Gastroenterology

## 2021-10-22 ENCOUNTER — Telehealth: Payer: Self-pay | Admitting: Gastroenterology

## 2021-10-22 NOTE — Telephone Encounter (Signed)
Hey Dr. Ardis Hughs,  ? ?Patient called in to cancel procedure 2022-10-30 due to death of family. She rescheduled for 5/19. ? ?Thank you ?

## 2021-10-24 ENCOUNTER — Encounter: Payer: Self-pay | Admitting: Gastroenterology

## 2021-10-30 DIAGNOSIS — I10 Essential (primary) hypertension: Secondary | ICD-10-CM | POA: Diagnosis not present

## 2021-10-30 DIAGNOSIS — E119 Type 2 diabetes mellitus without complications: Secondary | ICD-10-CM | POA: Diagnosis not present

## 2021-10-30 DIAGNOSIS — M6240 Contracture of muscle, unspecified site: Secondary | ICD-10-CM | POA: Diagnosis not present

## 2021-11-06 DIAGNOSIS — I10 Essential (primary) hypertension: Secondary | ICD-10-CM | POA: Diagnosis not present

## 2021-11-06 DIAGNOSIS — E119 Type 2 diabetes mellitus without complications: Secondary | ICD-10-CM | POA: Diagnosis not present

## 2021-11-06 DIAGNOSIS — M179 Osteoarthritis of knee, unspecified: Secondary | ICD-10-CM | POA: Diagnosis not present

## 2021-11-06 DIAGNOSIS — M5442 Lumbago with sciatica, left side: Secondary | ICD-10-CM | POA: Diagnosis not present

## 2021-11-13 ENCOUNTER — Telehealth: Payer: Self-pay | Admitting: Gastroenterology

## 2021-11-13 NOTE — Telephone Encounter (Signed)
We received a call from patient wanting to reschedule procedure scheduled 11/16/21. Per patient, scheduling conflict. Patient was rescheduled 01/04/22.  ?

## 2021-11-16 ENCOUNTER — Encounter: Payer: Self-pay | Admitting: Gastroenterology

## 2021-11-17 ENCOUNTER — Encounter: Payer: Self-pay | Admitting: Nurse Practitioner

## 2021-11-21 ENCOUNTER — Other Ambulatory Visit: Payer: Self-pay | Admitting: Nurse Practitioner

## 2021-11-21 ENCOUNTER — Encounter: Payer: Self-pay | Admitting: Nurse Practitioner

## 2021-11-21 DIAGNOSIS — B3731 Acute candidiasis of vulva and vagina: Secondary | ICD-10-CM

## 2021-11-21 MED ORDER — TERCONAZOLE 0.8 % VA CREA: 1.0000 | TOPICAL_CREAM | Freq: Every day | VAGINAL | 0 refills | Status: DC

## 2021-12-11 DIAGNOSIS — I10 Essential (primary) hypertension: Secondary | ICD-10-CM | POA: Diagnosis not present

## 2021-12-11 DIAGNOSIS — M5442 Lumbago with sciatica, left side: Secondary | ICD-10-CM | POA: Diagnosis not present

## 2021-12-11 DIAGNOSIS — E119 Type 2 diabetes mellitus without complications: Secondary | ICD-10-CM | POA: Diagnosis not present

## 2021-12-11 DIAGNOSIS — K30 Functional dyspepsia: Secondary | ICD-10-CM | POA: Diagnosis not present

## 2022-01-04 ENCOUNTER — Telehealth: Payer: Self-pay | Admitting: Gastroenterology

## 2022-01-04 ENCOUNTER — Encounter: Payer: Self-pay | Admitting: Gastroenterology

## 2022-01-04 NOTE — Telephone Encounter (Signed)
No show today after 3 other late cancellations. Please charge her for this no show per policy.  She cannot reschedule her colonoscopy.   Please offer her my first available ROV to discuss this all further.

## 2022-01-27 ENCOUNTER — Emergency Department (HOSPITAL_COMMUNITY)
Admission: EM | Admit: 2022-01-27 | Discharge: 2022-01-27 | Disposition: A | Discharge status: Home / Self Care | Payer: BC Managed Care – PPO | Attending: Emergency Medicine | Admitting: Emergency Medicine

## 2022-01-27 ENCOUNTER — Other Ambulatory Visit: Payer: Self-pay

## 2022-01-27 ENCOUNTER — Encounter (HOSPITAL_COMMUNITY): Payer: Self-pay

## 2022-01-27 DIAGNOSIS — R109 Unspecified abdominal pain: Secondary | ICD-10-CM | POA: Insufficient documentation

## 2022-01-27 DIAGNOSIS — E1165 Type 2 diabetes mellitus with hyperglycemia: Secondary | ICD-10-CM | POA: Diagnosis not present

## 2022-01-27 DIAGNOSIS — M549 Dorsalgia, unspecified: Secondary | ICD-10-CM | POA: Diagnosis not present

## 2022-01-27 DIAGNOSIS — R7309 Other abnormal glucose: Secondary | ICD-10-CM | POA: Insufficient documentation

## 2022-01-27 DIAGNOSIS — M5441 Lumbago with sciatica, right side: Secondary | ICD-10-CM | POA: Diagnosis not present

## 2022-01-27 DIAGNOSIS — K921 Melena: Secondary | ICD-10-CM

## 2022-01-27 LAB — CBC WITH DIFFERENTIAL/PLATELET
Abs Immature Granulocytes: 0.03 10*3/uL (ref 0.00–0.07)
Basophils Absolute: 0.1 10*3/uL (ref 0.0–0.1)
Basophils Relative: 1 %
Eosinophils Absolute: 0.4 10*3/uL (ref 0.0–0.5)
Eosinophils Relative: 5 %
HCT: 40.6 % (ref 36.0–46.0)
Hemoglobin: 13.9 g/dL (ref 12.0–15.0)
Immature Granulocytes: 0 %
Lymphocytes Relative: 35 %
Lymphs Abs: 3.3 10*3/uL (ref 0.7–4.0)
MCH: 31.3 pg (ref 26.0–34.0)
MCHC: 34.2 g/dL (ref 30.0–36.0)
MCV: 91.4 fL (ref 80.0–100.0)
Monocytes Absolute: 0.7 10*3/uL (ref 0.1–1.0)
Monocytes Relative: 8 %
Neutro Abs: 4.9 10*3/uL (ref 1.7–7.7)
Neutrophils Relative %: 51 %
Platelets: 304 10*3/uL (ref 150–400)
RBC: 4.44 MIL/uL (ref 3.87–5.11)
RDW: 13.3 % (ref 11.5–15.5)
WBC: 9.5 10*3/uL (ref 4.0–10.5)
nRBC: 0 % (ref 0.0–0.2)

## 2022-01-27 LAB — URINALYSIS, ROUTINE W REFLEX MICROSCOPIC
Bacteria, UA: NONE SEEN
Bilirubin Urine: NEGATIVE
Glucose, UA: >=500 mg/dL — AB
Ketones, ur: NEGATIVE mg/dL
Leukocytes,Ua: NEGATIVE
Nitrite: NEGATIVE
Protein, ur: 100 mg/dL — AB
Specific Gravity, Urine: 1.025 (ref 1.005–1.030)
pH: 5 (ref 5.0–8.0)

## 2022-01-27 LAB — COMPREHENSIVE METABOLIC PANEL
ALT: 18 U/L (ref 0–44)
AST: 17 U/L (ref 15–41)
Albumin: 3.5 g/dL (ref 3.5–5.0)
Alkaline Phosphatase: 76 U/L (ref 38–126)
Anion gap: 8 (ref 5–15)
BUN: 15 mg/dL (ref 6–20)
CO2: 25 mmol/L (ref 22–32)
Calcium: 9.4 mg/dL (ref 8.9–10.3)
Chloride: 104 mmol/L (ref 98–111)
Creatinine, Ser: 0.65 mg/dL (ref 0.44–1.00)
GFR, Estimated: >60 mL/min (ref >60)
Glucose, Bld: 127 mg/dL — ABNORMAL HIGH (ref 70–99)
Potassium: 3.8 mmol/L (ref 3.5–5.1)
Sodium: 137 mmol/L (ref 135–145)
Total Bilirubin: 0.5 mg/dL (ref 0.3–1.2)
Total Protein: 6.6 g/dL (ref 6.5–8.1)

## 2022-01-27 LAB — POC OCCULT BLOOD, ED: Fecal Occult Bld: NEGATIVE

## 2022-01-27 MED ORDER — PREDNISONE 20 MG PO TABS: ORAL_TABLET | ORAL | 0 refills | Status: DC

## 2022-01-27 MED ORDER — METHOCARBAMOL 500 MG PO TABS: 1000.0000 mg | ORAL_TABLET | Freq: Four times a day (QID) | ORAL | 0 refills | Status: DC | PRN

## 2022-01-27 NOTE — ED Triage Notes (Signed)
Patient arrived with complaint of lower back since awakening this am with radiation down right leg. Reports hx of neck and back problems. States that she fell out of bed this am. Ambulatory and no incontinence

## 2022-01-27 NOTE — Discharge Instructions (Signed)
Please read and follow all provided instructions.  Your diagnoses today include:  1. Acute bilateral low back pain with right-sided sciatica   2. Hematochezia     Tests performed today include: Blood cell counts and platelets: Normal red blood cell count Kidney and liver function tests Pancreas function test (called lipase) Urine test to look for infection: No sign of infection A blood or urine test for pregnancy (women only) Vital signs. See below for your results today.   Medications prescribed:  Robaxin (methocarbamol) - muscle relaxer medication  DO NOT drive or perform any activities that require you to be awake and alert because this medicine can make you drowsy.   Prednisone - steroid medicine   It is best to take this medication in the morning to prevent sleeping problems. If you are diabetic, monitor your blood sugar closely and stop taking Prednisone if blood sugar is over 300. Take with food to prevent stomach upset.   Take any prescribed medications only as directed.  Home care instructions:  Follow any educational materials contained in this packet.  Follow-up instructions: Please follow-up with your primary care provider in the next 2 days for further evaluation of your symptoms.    Return instructions:  SEEK IMMEDIATE MEDICAL ATTENTION IF: The pain does not go away or becomes severe  A temperature above 101F develops  Repeated vomiting occurs (multiple episodes)  The pain becomes localized to portions of the abdomen. The right side could possibly be appendicitis. In an adult, the left lower portion of the abdomen could be colitis or diverticulitis.  Blood is being passed in stools or vomit (bright red or black tarry stools)  You develop chest pain, difficulty breathing, dizziness or fainting, or become confused, poorly responsive, or inconsolable (young children) If you have any other emergent concerns regarding your health  Additional Information: Abdominal  (belly) pain can be caused by many things. Your caregiver performed an examination and possibly ordered blood/urine tests and imaging (CT scan, x-rays, ultrasound). Many cases can be observed and treated at home after initial evaluation in the emergency department. Even though you are being discharged home, abdominal pain can be unpredictable. Therefore, you need a repeated exam if your pain does not resolve, returns, or worsens. Most patients with abdominal pain don't have to be admitted to the hospital or have surgery, but serious problems like appendicitis and gallbladder attacks can start out as nonspecific pain. Many abdominal conditions cannot be diagnosed in one visit, so follow-up evaluations are very important.  Your vital signs today were: BP 117/77   Pulse 79   Temp 98.3 F (36.8 C) (Oral)   Resp 16   LMP 09/21/2017 (Exact Date)   SpO2 95%  If your blood pressure (bp) was elevated above 135/85 this visit, please have this repeated by your doctor within one month. --------------

## 2022-01-27 NOTE — ED Provider Notes (Signed)
[facility] EMERGENCY DEPARTMENT Provider Note   CSN: [id number] Arrival date & time: 01/27/22  2297     History  No chief complaint on file.   Jody Duke is a 51 y.o. female.  Patient with history of hypertension, diabetes --presents to the emergency department for evaluation of back pain, abdominal pain and rectal bleeding.  Patient states that she fell getting out of bed this morning because "my back gave out on me".  She states that she went to work and continue to have back pain, prompting emergency department visit.  In addition she reports generalized abdominal pain and noted blood in her stool.  This has been ongoing, but she noticed a larger amount of dark red blood in the stool this morning.  No lightheadedness.  No shortness of breath.  Patient has been referred to GI due to blood in the stool by PCP, however she has had multiple missed appointments.  She has never had a colonoscopy. Patient denies warning symptoms of back pain including: fecal incontinence, urinary retention or overflow incontinence, night sweats, waking from sleep with back pain, unexplained fevers or weight loss, h/o cancer, IVDU, recent trauma.  Currently takes gabapentin.        Home Medications Prior to Admission medications   Medication Sig Start Date End Date Taking? Authorizing Provider  atorvastatin (LIPITOR) 40 MG tablet TAKE 1 TABLET BY MOUTH EVERY DAY 11/11/19   Jacelyn Pi, Lilia Argue, MD  busPIRone (BUSPAR) 10 MG tablet Take 10 mg by mouth 3 (three) times daily. 01/12/20   [provider]  clobetasol ointment (TEMOVATE) 9.89 % Apply 1 application topically 2 (two) times daily. Initial dose: twice daily x 1 week, then daily x 1 week, then every other day x 1 week, then twice weekly 08/14/21   Marny Lowenstein A, NP  cloNIDine (CATAPRES) 0.1 MG tablet Take 0.1 mg by mouth at bedtime. 06/03/21   [provider]  estradiol (ESTRACE) 1 MG tablet Take 1 tablet (1 mg  total) by mouth daily. 08/14/21   Marny Lowenstein A, NP  FARXIGA 10 MG TABS tablet TAKE 10 MG BY MOUTH DAILY BEFORE BREAKFAST. 12/17/19   Daleen Squibb, MD  gabapentin (NEURONTIN) 600 MG tablet Take 1 tablet (600 mg total) by mouth 3 (three) times daily. 09/29/19 08/05/22  Wendall Mola, NP  losartan-hydrochlorothiazide (HYZAAR) 50-12.5 MG tablet Take 1 tablet by mouth daily. 08/26/19   Wendall Mola, NP  metFORMIN (GLUCOPHAGE) 500 MG tablet Take 500 mg by mouth 2 (two) times daily. 08/09/21   [provider]  methocarbamol (ROBAXIN) 750 MG tablet Take 750 mg by mouth 2 (two) times daily. 07/22/21   [provider]  Potassium Chloride ER 20 MEQ TBCR Take 20 mEq by mouth daily for 3 days. 08/30/21 09/02/21  Willia Craze, NP  terconazole (TERAZOL 3) 0.8 % vaginal cream Place 1 applicator vaginally at bedtime. 11/21/21   Marny Lowenstein A, NP  TRULICITY 2.11 HE/1.7EY SOPN Inject 0.75 mg into the skin once a week. 01/04/20   [provider]  venlafaxine XR (EFFEXOR XR) 75 MG 24 hr capsule Take 1 capsule (75 mg total) by mouth daily with breakfast. 09/29/19 12/04/26  Wendall Mola, NP  Vitamin D, Ergocalciferol, (DRISDOL) 1.25 MG (50000 UNIT) CAPS capsule Take 50,000 Units by mouth once a week. 12/28/19   [provider]      Allergies    Percocet [oxycodone-acetaminophen] and Ozempic (0.25 or 0.5 mg-dose) [  semaglutide(0.25 or 0.'5mg'$ -dos)]    Review of Systems   Review of Systems  Physical Exam Updated Vital Signs BP 118/75 (BP Location: Right Arm)   Pulse (!) 109   Temp 98.3 F (36.8 C) (Oral)   Resp 17   LMP 09/21/2017 (Exact Date)   SpO2 96%   Physical Exam Vitals and nursing note reviewed. Exam conducted with a chaperone present.  Constitutional:      General: She is not in acute distress.    Appearance: She is well-developed.  HENT:     Head: Normocephalic and atraumatic.     Right Ear: External ear normal.     Left Ear: External  ear normal.     Nose: Nose normal.  Eyes:     Conjunctiva/sclera: Conjunctivae normal.  Cardiovascular:     Rate and Rhythm: Normal rate and regular rhythm.     Heart sounds: No murmur heard. Pulmonary:     Effort: No respiratory distress.     Breath sounds: No wheezing, rhonchi or rales.  Abdominal:     Palpations: Abdomen is soft.     Tenderness: There is abdominal tenderness (generalized). There is no guarding or rebound.  Genitourinary:    Rectum: Guaiac result negative. No tenderness, external hemorrhoid or internal hemorrhoid.     Comments: Light brown stool Musculoskeletal:     Cervical back: Normal range of motion and neck supple.     Lumbar back: Tenderness present. No bony tenderness. Normal range of motion.     Right lower leg: No edema.     Left lower leg: No edema.     Comments: Tender to palpation R paraspinous musculature  Skin:    General: Skin is warm and dry.     Findings: No rash.  Neurological:     General: No focal deficit present.     Mental Status: She is alert. Mental status is at baseline.     Motor: No weakness.  Psychiatric:        Mood and Affect: Mood normal.     ED Results / Procedures / Treatments   Labs (all labs ordered are listed, but only abnormal results are displayed) Labs Reviewed  COMPREHENSIVE METABOLIC PANEL - Abnormal; Notable for the following components:      Result Value   Glucose, Bld 127 (*)    All other components within normal limits  URINALYSIS, ROUTINE W REFLEX MICROSCOPIC - Abnormal; Notable for the following components:   Glucose, UA >=500 (*)    Hgb urine dipstick MODERATE (*)    Protein, ur 100 (*)    All other components within normal limits  CBC WITH DIFFERENTIAL/PLATELET  POC OCCULT BLOOD, ED    EKG None  Radiology No results found.  Procedures Procedures    Medications Ordered in ED Medications - No data to display  ED Course/ Medical Decision Making/ A&P    Patient seen and examined. History  obtained directly from patient. Also reviewed GI notes and PCP notes.   Labs/EKG: Ordered CBC, CMP, UA, Hemoccult.  Imaging: None ordered  Medications/Fluids: None ordered  Most recent vital signs reviewed and are as follows: BP 118/75 (BP Location: Right Arm)   Pulse (!) 109   Temp 98.3 F (36.8 C) (Oral)   Resp 17   LMP 09/21/2017 (Exact Date)   SpO2 96%   Initial impression: Abdominal pain, GI bleeding which has been ongoing, lower back pain.  1:00 PM Reassessment performed. Patient appears comfortable.  Labs personally reviewed and  interpreted including: CBC unremarkable; CMP with glucose elevated 127 otherwise unremarkable; Hemoccult negative.  UA pending.  Reviewed pertinent lab work and imaging with patient at bedside. Questions answered.   Most current vital signs reviewed and are as follows: BP 117/77   Pulse 79   Temp 98.3 F (36.8 C) (Oral)   Resp 16   LMP 09/21/2017 (Exact Date)   SpO2 95%   Plan: We will likely discharged home with Robaxin and prednisone for her lower back pain.  No evidence of clinically significant GI bleeding.  No focality to abdominal exam.  Patient be given a work note.  Awaiting UA.  2:38 PM   Labs personally reviewed and interpreted including: UA without signs of infection  Most current vital signs reviewed and are as follows: BP 117/77   Pulse 79   Temp 98.3 F (36.8 C) (Oral)   Resp 16   LMP 09/21/2017 (Exact Date)   SpO2 95%   Plan: Discharge to home.   Prescriptions written for: Prednisone taper, Robaxin  Other home care instructions discussed: Discussed need to monitor her blood sugars closely while on prednisone.  Patient states that she does check her blood sugars at home.  Patient encouraged to discontinue if blood sugars exceed 300.  Patient counseled on proper use of muscle relaxant medication.  They were told not to drink alcohol, drive any vehicle, or do any dangerous activities while taking this medication.   Patient verbalized understanding.  ED return instructions discussed: Urged to return with worsening severe pain, loss of bowel or bladder control, trouble walking.   Follow-up instructions discussed: Patient encouraged to follow-up with their PCP in 3 days for recheck of symptoms and to discuss intermittent GI bleeding.                            Medical Decision Making Amount and/or Complexity of Data Reviewed Labs: ordered.  Risk Prescription drug management.   Patient with back pain. No neurological deficits. Patient is ambulatory. No warning symptoms of back pain including: fecal incontinence, urinary retention or overflow incontinence, night sweats, waking from sleep with back pain, unexplained fevers or weight loss, h/o cancer, IVDU, recent trauma. Low concern for cauda equina, epidural abscess, or other serious cause of back pain. Conservative measures such as rest, ice/heat and pain medicine indicated with PCP follow-up if no improvement with conservative management.   For this patient's complaint of abdominal pain, the following conditions were considered on the differential diagnosis: gastritis/PUD, enteritis/duodenitis, appendicitis, cholelithiasis/cholecystitis, cholangitis, pancreatitis, ruptured viscus, colitis, diverticulitis, small/large bowel obstruction, proctitis, cystitis, pyelonephritis, ureteral colic, aortic dissection, aortic aneurysm. In women, ectopic pregnancy, pelvic inflammatory disease, ovarian cysts, and tubo-ovarian abscess were also considered. Atypical chest etiologies were also considered including ACS, PE, and pneumonia.   She reports red blood per rectum: hemoccult neg here, hemoglobin is normal, no concern for clinically significant ongoing GI bleeding.   The patient's vital signs, pertinent lab work and imaging were reviewed and interpreted as discussed in the ED course. Hospitalization was considered for further testing, treatments, or serial  exams/observation. However as patient is well-appearing, has a stable exam, and reassuring studies today, I do not feel that they warrant admission at this time. This plan was discussed with the patient who verbalizes agreement and comfort with this plan and seems reliable and able to return to the Emergency Department with worsening or changing symptoms.         Final  Clinical Impression(s) / ED Diagnoses Final diagnoses:  Acute bilateral low back pain with right-sided sciatica  Hematochezia    Rx / DC Orders ED Discharge Orders          Ordered    predniSONE (DELTASONE) 20 MG tablet        01/27/22 1408    methocarbamol (ROBAXIN) 500 MG tablet  Every 6 hours PRN        01/27/22 1408              Carlisle Cater, PA-C 01/27/22 [place], [place], DO 01/27/22 1524

## 2022-02-12 ENCOUNTER — Encounter: Payer: Self-pay | Admitting: Nurse Practitioner

## 2022-02-12 ENCOUNTER — Other Ambulatory Visit: Payer: Self-pay | Admitting: Nurse Practitioner

## 2022-02-12 DIAGNOSIS — B3731 Acute candidiasis of vulva and vagina: Secondary | ICD-10-CM

## 2022-02-12 MED ORDER — TERCONAZOLE 0.8 % VA CREA: 1.0000 | TOPICAL_CREAM | Freq: Every day | VAGINAL | 0 refills | Status: DC

## 2022-03-08 ENCOUNTER — Other Ambulatory Visit: Payer: Self-pay | Admitting: Internal Medicine

## 2022-03-13 ENCOUNTER — Other Ambulatory Visit: Payer: Self-pay | Admitting: Internal Medicine

## 2022-03-13 DIAGNOSIS — Z1231 Encounter for screening mammogram for malignant neoplasm of breast: Secondary | ICD-10-CM

## 2022-03-15 ENCOUNTER — Other Ambulatory Visit: Payer: Self-pay | Admitting: Family Medicine

## 2022-03-15 ENCOUNTER — Ambulatory Visit (INDEPENDENT_AMBULATORY_CARE_PROVIDER_SITE_OTHER): Payer: BC Managed Care – PPO | Admitting: Family Medicine

## 2022-03-15 VITALS — BP 120/70 | Ht 61.0 in

## 2022-03-15 DIAGNOSIS — M25561 Pain in right knee: Secondary | ICD-10-CM

## 2022-03-15 DIAGNOSIS — M79605 Pain in left leg: Secondary | ICD-10-CM | POA: Insufficient documentation

## 2022-03-15 DIAGNOSIS — M25562 Pain in left knee: Secondary | ICD-10-CM | POA: Diagnosis not present

## 2022-03-15 DIAGNOSIS — M5010 Cervical disc disorder with radiculopathy, unspecified cervical region: Secondary | ICD-10-CM

## 2022-03-15 DIAGNOSIS — M5412 Radiculopathy, cervical region: Secondary | ICD-10-CM

## 2022-03-15 DIAGNOSIS — G8929 Other chronic pain: Secondary | ICD-10-CM

## 2022-03-15 MED ORDER — METHYLPREDNISOLONE ACETATE 40 MG/ML IJ SUSP
40.0000 mg | Freq: Once | INTRAMUSCULAR | Status: AC
  Administered 2022-03-15: 40 mg via INTRA_ARTICULAR

## 2022-03-15 MED ORDER — GABAPENTIN 600 MG PO TABS: ORAL_TABLET | ORAL | 1 refills | Status: DC

## 2022-03-15 NOTE — Progress Notes (Unsigned)
Established Patient Office Visit  Subjective   Patient ID: Jody Duke, female    DOB: 28-Feb-1971  Age: 51 y.o. MRN: [id number]  Left leg numbness and b/l knee pain.  Jody Duke presents today with chief complaint of numbness that incorporates her entire left leg, front, back, and both sides.  She states that the pain is a sensation of pins-and-needles that starts at the bottom of her foot with a burning and radiates up her leg.  This has been occurring for approximately 1 week.  This is causing her to lose her balance and is experiencing some decrease sensation.  She denies any recent illnesses, loss of bowel or bladder control, injury.  She is a well-controlled type II diabetic. Her knee pain comes and goes, seems to flare at times.  She denies any buckling or locking.  She has had a steroid injection in the past with gave her relief.  She is requesting today.   ROS as listed above in HPI    Objective:     BP 120/70   Ht '5\' 1"'$  (1.549 m)   LMP 09/21/2017 (Exact Date)   BMI 35.22 kg/m   Physical Exam Vitals reviewed.  Constitutional:      General: She is not in acute distress.    Appearance: Normal appearance. She is obese. She is not ill-appearing, toxic-appearing or diaphoretic.  Pulmonary:     Effort: Pulmonary effort is normal.  Neurological:     Mental Status: She is alert.   Left leg: No obvious deformity or asymmetry.  No ecchymosis or edema.  She has some tenderness to palpation on the plantar surface of her feet under the metatarsal heads and pain with squeeze of the forefoot.  She has full range of motion at the ankle with dorsiflexion, plantarflexion, supination and pronation.  Strength at the ankle 5/5.  Negative seated straight leg raise Left knee: No obvious deformity or asymmetry.  No ecchymosis or edema.  She has full range of motion with flexion extension at the knee strength 5/5 Right knee: No obvious deformity or asymmetry.  No ecchymosis or edema.  She  has full range of motion with flexion and extension at the knee. Strength 5/5    Assessment & Plan:   Problem List Items Addressed This Visit       Nervous and Auditory   Cervical disc disorder with radiculopathy of cervical region    Order placed for repeat cervical ESI injections as she has done well with these in the past      Relevant Medications   gabapentin (NEURONTIN) 600 MG tablet   Other Relevant Orders   Ambulatory referral to Interventional Radiology     Other   Pain in both knees    She has a history of bilateral osteoarthritis improvement of her pain with cortisone injection in the past greater than 3 months ago.  Patient is likely experiencing acute on chronic flare of osteoarthritis.  We went ahead and gave her bilateral injections as detailed below.  She tolerated procedures well.  She will follow-up in 3 to 4 weeks or sooner if symptoms worsen or fail to improve.      Left leg pain - Primary    I am uncertain at this time as to the etiology of her leg numbness and tingling.  I anticipate this may be coming from her back.  No current red flag signs for cauda equina.  We will increase patient's gabapentin to 600 in the  evening.  Refill this medication sent to her pharmacy today.  Patient is to follow-up in 3 to 4 weeks or sooner if symptoms worsen or fail to improve.  If she does not have any improvement can consider MRI of the lumbar spine.      After informed written consent timeout was performed, patient was seated on exam table. Right knee was prepped with alcohol swab and utilizing anteromedial approach, patient's right knee was injected intraarticularly with 3:1 lidocaine: depomedrol. Patient tolerated the procedure well without immediate complications.   After informed written consent timeout was performed, patient was seated on exam table. Left knee was prepped with alcohol swab and utilizing anteromedial approach, patient's left knee was injected intraarticularly  with 3:1 lidocaine: depomedrol. Patient tolerated the procedure well without immediate complications.    Return in about 4 weeks (around 04/12/2022), or sooner if symptoms worsen or fail to improve.    Elmore Guise, DO

## 2022-03-15 NOTE — Assessment & Plan Note (Signed)
Order placed for repeat cervical ESI injections as she has done well with these in the past

## 2022-03-15 NOTE — Assessment & Plan Note (Signed)
I am uncertain at this time as to the etiology of her leg numbness and tingling.  I anticipate this may be coming from her back.  No current red flag signs for cauda equina.  We will increase patient's gabapentin to 600 in the evening.  Refill this medication sent to her pharmacy today.  Patient is to follow-up in 3 to 4 weeks or sooner if symptoms worsen or fail to improve.  If she does not have any improvement can consider MRI of the lumbar spine.

## 2022-03-15 NOTE — Assessment & Plan Note (Addendum)
She has a history of bilateral osteoarthritis improvement of her pain with cortisone injection in the past greater than 3 months ago.  Patient is likely experiencing acute on chronic flare of osteoarthritis.  We went ahead and gave her bilateral injections as detailed below.  She tolerated procedures well.  She will follow-up in 3 to 4 weeks or sooner if symptoms worsen or fail to improve.

## 2022-03-15 NOTE — Patient Instructions (Addendum)
Today we injected both knees so this should may those better. Let us see you in about 4-6 weeks. I have called in the new dose of gabapentin  Call Juliann Pulse at [facility] and sched your neck injection. [phone]

## 2022-03-17 NOTE — Progress Notes (Signed)
[facility]: Attending Note: I have reviewed the chart, discussed wit the Sports Medicine Fellow. I agree with assessment and treatment plan as detailed in the [place] note. Bilateral corticosteroid injections into the knees today. For her paresthesias in the upper extremity, we will increase her gabapentin follow-up 1 month.  We had previously gotten an MRI for her neck and she wanted to go ahead and move forward with the steroid injections under fluoroscopy which we have ordered today.

## 2022-03-26 ENCOUNTER — Other Ambulatory Visit: Payer: BC Managed Care – PPO

## 2022-04-01 ENCOUNTER — Ambulatory Visit
Admission: RE | Admit: 2022-04-01 | Discharge: 2022-04-01 | Disposition: A | Discharge status: Home / Self Care | Payer: BC Managed Care – PPO | Source: Ambulatory Visit | Attending: Family Medicine | Admitting: Family Medicine

## 2022-04-01 DIAGNOSIS — M5412 Radiculopathy, cervical region: Secondary | ICD-10-CM

## 2022-04-01 MED ORDER — IOPAMIDOL (ISOVUE-M 300) INJECTION 61%
1.0000 mL | Freq: Once | INTRAMUSCULAR | Status: AC
  Administered 2022-04-01: 1 mL via EPIDURAL

## 2022-04-01 MED ORDER — TRIAMCINOLONE ACETONIDE 40 MG/ML IJ SUSP (RADIOLOGY)
60.0000 mg | Freq: Once | INTRAMUSCULAR | Status: AC
  Administered 2022-04-01: 60 mg via EPIDURAL

## 2022-04-01 NOTE — Discharge Instructions (Signed)

## 2022-04-19 ENCOUNTER — Encounter: Payer: Self-pay | Admitting: Family Medicine

## 2022-04-19 ENCOUNTER — Ambulatory Visit (INDEPENDENT_AMBULATORY_CARE_PROVIDER_SITE_OTHER): Payer: BC Managed Care – PPO | Admitting: Family Medicine

## 2022-04-19 ENCOUNTER — Ambulatory Visit
Admission: RE | Admit: 2022-04-19 | Discharge: 2022-04-19 | Disposition: A | Discharge status: Home / Self Care | Payer: BC Managed Care – PPO | Source: Ambulatory Visit | Attending: Family Medicine | Admitting: Family Medicine

## 2022-04-19 VITALS — BP 120/78 | Ht 61.0 in

## 2022-04-19 DIAGNOSIS — M5416 Radiculopathy, lumbar region: Secondary | ICD-10-CM

## 2022-04-19 DIAGNOSIS — M545 Low back pain, unspecified: Secondary | ICD-10-CM | POA: Diagnosis not present

## 2022-04-19 NOTE — Progress Notes (Unsigned)
  Jody Duke - 51 y.o. female MRN [id number]  Date of birth: 21-Jan-1971    CHIEF COMPLAINT:   knee pain and left leg numbness/weakness follow up    SUBJECTIVE:   HPI:  Pleasant 51 year old female is here for follow-up of left leg numbness/weakness and knee pain.  She was last seen here on 9/15.  At that time she had bilateral corticosteroid injections in her knees and her dose of gabapentin was increased, 1 in the AM, 1 in the afternoon, 2 at bed, for left leg numbness and tingling.  She says the knees feel much better.  The left leg is still bothering her.  With increased dose of gabapentin, the pain is a little better but it still significantly bothers her at night.  She has numbness and tingling that keeps her up at night.  She still has weakness in the leg.  She is has difficulty taking care of her daily activities such without assistance due to left leg weakness.  This leg has been weak/numb/tingling for a number of years now and she is ready to get definitive treatment.  Denies any numbness or tingling in the groin.  Denies any loss of bowel or bladder control.  Of note, she has cervical disc disorder with radiculopathy in the past.  She had a cervical epidural steroid injection about a month ago and she did quite well with this.  ROS:     See HPI  PERTINENT  PMH / PSH FH / / SH:  Past Medical, Surgical, Social, and Family History Reviewed & Updated in the EMR.  Pertinent findings include:  Cervical disc disorder  OBJECTIVE: LMP 09/21/2017 (Exact Date)   Physical Exam:  Vital signs are reviewed.  GEN: Alert and oriented, NAD Pulm: Breathing unlabored PSY: normal mood, congruent affect  MSK: Lumbar spine -no obvious deformity.  Tender to palpation over lower lumbar spinous processes, no bony step-off.  No lumbar paraspinal musculature tenderness.  Full range of motion in flexion, extension, side bending and twisting but this does cause some lower lumbar pain on the left side.   Positive straight leg raise on the left.  Negative straight leg raise on the right.  Hips -nontender to palpation at iliac crest or greater trochanter.  Full range of motion in hip flexion bilaterally.  5/5 strength in right hip flexion.  3/5 strength in left hip flexion.  Negative logroll test bilaterally.  LLE -2+ patellar and Achilles reflexes.  Neurovascularly intact distally.  ASSESSMENT & PLAN:  1.  Left sided lumbar radiculopathy -This is a chronic issue with exacerbation that has not responded to conservative therapy.  Her clinical picture is consistent with a radiculopathy that is likely from a herniated disc, this is even more likely given the fact that she has disc issues in her cervical spine as well.  We will proceed with getting an x-ray followed by an MRI of the lumbar spine to better understand her disc integrity.  In the meantime, we will have her increase her gabapentin dose to 3 of the 600 tablets at night to help her with sleep.  We will follow-up by telephone with MRI results.  Suspect she may need epidural steroid injection in the lumbar spine in the future.  All questions answered she agrees to plan.  Dortha Kern, MD PGY-4, Sports Medicine Fellow [place]

## 2022-04-21 NOTE — Progress Notes (Signed)
[facility]: Attending Note: I have reviewed the chart, discussed wit the Sports Medicine Fellow. I agree with assessment and treatment plan as detailed in the [place] note. #1.  Knee pain much improved with corticosteroid injection 2.  Chronic right low back pain is worse.  We know from her imaging studies of her cervical spine that she has tendency to have arthritic change, small foramina and some impingement so we will image her low back.  I suspect we will see similar.  MRI ordered.

## 2022-04-22 ENCOUNTER — Telehealth: Payer: Self-pay | Admitting: *Deleted

## 2022-04-22 ENCOUNTER — Encounter: Payer: Self-pay | Admitting: Family Medicine

## 2022-04-23 NOTE — Telephone Encounter (Signed)
Wow November 4 seems like a long time away but I guess that is actually next week! Good. X rays showed some mild arthritis as we expected. Nothing that we did not expect. They did comment on some atherosclerosis (hardening of the arteries) so follow up with your PCP about BP, cholesterol etc. Frequently seen in [place] "older" people.Here is the report: FINDINGS: Normal alignment. No fracture or dislocation. Anterior vertebral endplate spurring at multiple levels in the lower thoracic spine. There is mild narrowing of the L4-5 interspace. Patchy aortic calcifications without suggestion of aneurysm.   IMPRESSION: 1. No acute findings. 2. Mild L4-5 disc narrowing. 3.  Aortic Atherosclerosis

## 2022-04-24 DIAGNOSIS — Z1231 Encounter for screening mammogram for malignant neoplasm of breast: Secondary | ICD-10-CM

## 2022-05-01 DIAGNOSIS — Z1231 Encounter for screening mammogram for malignant neoplasm of breast: Secondary | ICD-10-CM

## 2022-05-04 ENCOUNTER — Other Ambulatory Visit: Payer: BC Managed Care – PPO

## 2022-06-08 ENCOUNTER — Ambulatory Visit
Admission: RE | Admit: 2022-06-08 | Discharge: 2022-06-08 | Disposition: A | Discharge status: Home / Self Care | Payer: BC Managed Care – PPO | Source: Ambulatory Visit | Attending: Family Medicine | Admitting: Family Medicine

## 2022-06-08 DIAGNOSIS — M5416 Radiculopathy, lumbar region: Secondary | ICD-10-CM

## 2022-06-08 DIAGNOSIS — M48061 Spinal stenosis, lumbar region without neurogenic claudication: Secondary | ICD-10-CM | POA: Diagnosis not present

## 2022-06-08 DIAGNOSIS — M5127 Other intervertebral disc displacement, lumbosacral region: Secondary | ICD-10-CM | POA: Diagnosis not present

## 2022-06-13 NOTE — Progress Notes (Signed)
Has appt w me tomorrow and we can discuss then as well as look at her knee

## 2022-06-14 ENCOUNTER — Ambulatory Visit (INDEPENDENT_AMBULATORY_CARE_PROVIDER_SITE_OTHER): Payer: BC Managed Care – PPO | Admitting: Family Medicine

## 2022-06-14 ENCOUNTER — Other Ambulatory Visit: Payer: Self-pay | Admitting: Family Medicine

## 2022-06-14 VITALS — BP 104/60 | Ht 62.0 in | Wt 185.0 lb

## 2022-06-14 DIAGNOSIS — M25562 Pain in left knee: Secondary | ICD-10-CM

## 2022-06-14 DIAGNOSIS — G8929 Other chronic pain: Secondary | ICD-10-CM

## 2022-06-14 DIAGNOSIS — M25561 Pain in right knee: Secondary | ICD-10-CM | POA: Diagnosis not present

## 2022-06-14 DIAGNOSIS — M5416 Radiculopathy, lumbar region: Secondary | ICD-10-CM

## 2022-06-14 MED ORDER — METHYLPREDNISOLONE ACETATE 40 MG/ML IJ SUSP
40.0000 mg | Freq: Once | INTRAMUSCULAR | Status: AC
  Administered 2022-06-14: 40 mg via INTRA_ARTICULAR

## 2022-06-14 NOTE — Progress Notes (Signed)
  SUBJECTIVE:   Chief Complaint: knee pain and leg numbness  History of Presenting Illness:   Jody Duke is a 51 year old female with a past medical history of cervical spine arthropathy with radiculopathy who presents for follow-up evaluation of knee pain and leg numbness. She was last seen at [facility] on 10-20 when further diagnostic testing with MRI was ordered for suspected radiculopathy and her gabapentin dose was increased to '1800mg'$  nightly.   She reports experiencing the same level of lower extremity pain and numbness since increasing her gabapentin dose. Although she has been sleeping well, she has been feeling some intermittent lightheadedness and increased fatigue. She has previously received epidural steroid injections into her cervical region with improvement and is open to trying injections for her lower back as well.  Additionally, she reports bilateral knee pain secondary to osteoarthritis. She receives intraarticular corticosteroid injections once every three months. Last injection was 03-2022, which provided about 2.5 months of relief. She is interested in receiving another injection into both knees today.   MRI on 06-08-2022: 1. Left foraminal to extraforaminal disc protrusion at L2-3, contacting and potentially affecting the exiting left L2 nerve root. 2. Broad-based left extraforaminal disc protrusion at L3-4, which could potentially irritate the exiting left L3 nerve root. 3. Small central disc protrusion with facet hypertrophy at L4-5 with resultant mild canal and bilateral subarticular stenosis, with mild left L4 foraminal narrowing. 4. Shallow central disc protrusion at L5-S1 without significant stenosis or impingement. 5. Mild chronic compression deformity at the superior endplate of N98 without bony retropulsion.   Pertinent Medical History:   cervical spine arthropathy  Past Medical History:  Diagnosis Date   Ankle mass, left 07/30/2017   Blood pressure elevated  without history of HTN 07/30/2017   Breast mass, left 07/2017   Breast mass, right 07/30/2017   Class 1 obesity due to excess calories with serious comorbidity and body mass index (BMI) of 33.0 to 33.9 in adult 07/30/2017   Diabetes mellitus without complication ([place])    Essential hypertension 06/21/2019   Hematuria, microscopic 07/30/2017   Hot flashes due to surgical menopause 12/28/2017   Hyperlipidemia    Menometrorrhagia 07/30/2017   Migraines    Pure hypercholesterolemia 07/30/2017   Tobacco abuse 07/30/2017   Type 2 diabetes mellitus with microalbuminuria, without long-term current use of insulin ([place]) 07/30/2017   Uncontrolled type 2 diabetes mellitus with hyperglycemia ([place]) 08/26/2019   Vaginal discharge 08/26/2019     OBJECTIVE:   BP 104/60   Ht '5\' 2"'$  (1.575 m)   Wt 185 lb (83.9 kg)   LMP 09/21/2017 (Exact Date)   BMI 33.84 kg/m   Physical Examination:  Back   Inspection ----- No swelling, discoloration, atrophy, or gross abnormalities Motion --------- Range full with flexion, extension, and lateral bending Palpation ------ Tenderness to palpation over lumbar spinous processes at L2 level  Sensation ----- Light touch intact across dermatomes L3-S1 bilaterally   ASSESSMENT/PLAN:   Patient's presentation is most consistent with left L2-L4 radiculopathy and bilateral knee osteoarthritis.  > Administer intraarticular corticosteroid injections into knees bilaterally > Referral for facet joint injections given lack of improvement with gabapentin    There are no diagnoses linked to this encounter.  No follow-ups on file.  Jody Nickel, MD  06/14/2022, 9:42 AM

## 2022-06-14 NOTE — Progress Notes (Signed)
[place] Attending Note: I have seen and examined this patient. I have discussed this patient with the resident and reviewed the assessment and plan as documented above. I agree with the resident's findings and plan. Radiculopathy from foraminal stenosis L3-L4 on the left symptomatically and confirmed issues there on MRI.  She also has some pretty significant arthropathy of the facet joints.  Will set her up for facet joint injection.  She has had similar injection in her cervical spine and they worked pretty well. 2.  Bilateral OA of the knees: Today we injected with corticosteroid.  I will see her back in follow-up in 1 month.  PROCEDURE: INJECTION: Patient was given informed consent, signed copy in the chart. Appropriate time out was taken. Area prepped and draped in usual sterile fashion. Ethyl chloride was  used for local anesthesia. A 21 gauge 1 1/2 inch needle was used.. 1 cc of methylprednisolone 40 mg/ml plus  4 cc of 1% lidocaine without epinephrine was injected into the Bilateral knees using a(n) anterior medical approach.   The patient tolerated the procedure well. There were no complications. Post procedure instructions were given.

## 2022-06-27 ENCOUNTER — Ambulatory Visit
Admission: RE | Admit: 2022-06-27 | Discharge: 2022-06-27 | Disposition: A | Discharge status: Home / Self Care | Payer: BC Managed Care – PPO | Source: Ambulatory Visit | Attending: Internal Medicine | Admitting: Internal Medicine

## 2022-06-27 DIAGNOSIS — Z1231 Encounter for screening mammogram for malignant neoplasm of breast: Secondary | ICD-10-CM | POA: Diagnosis not present

## 2022-07-03 NOTE — Discharge Instructions (Signed)

## 2022-07-04 ENCOUNTER — Ambulatory Visit
Admission: RE | Admit: 2022-07-04 | Discharge: 2022-07-04 | Disposition: A | Discharge status: Home / Self Care | Payer: BC Managed Care – PPO | Source: Ambulatory Visit | Attending: Family Medicine | Admitting: Family Medicine

## 2022-07-04 DIAGNOSIS — M5416 Radiculopathy, lumbar region: Secondary | ICD-10-CM

## 2022-07-04 DIAGNOSIS — M4727 Other spondylosis with radiculopathy, lumbosacral region: Secondary | ICD-10-CM | POA: Diagnosis not present

## 2022-07-04 MED ORDER — IOPAMIDOL (ISOVUE-M 200) INJECTION 41%
1.0000 mL | Freq: Once | INTRAMUSCULAR | Status: AC
  Administered 2022-07-04: 1 mL via EPIDURAL

## 2022-07-04 MED ORDER — METHYLPREDNISOLONE ACETATE 40 MG/ML INJ SUSP (RADIOLOG
80.0000 mg | Freq: Once | INTRAMUSCULAR | Status: AC
  Administered 2022-07-04: 80 mg via EPIDURAL

## 2022-08-05 ENCOUNTER — Other Ambulatory Visit: Payer: Self-pay

## 2022-08-05 DIAGNOSIS — Z79890 Hormone replacement therapy (postmenopausal): Secondary | ICD-10-CM

## 2022-08-05 NOTE — Telephone Encounter (Signed)
Received script from alternate pharmacy than where we sent script last year. I called pt to confirm and also confirmed with her that we typically would only send a temporary refill in this case since her AEX is scheduled soon and then will provide her w/ a script for a year at her visit if appropriate and pt reported she still would like temp refill sent to mail order.  Last AEX 08/14/2021--scheduled for 08/15/2022 Last mammo 06/27/2022-neg birads 1

## 2022-08-06 MED ORDER — ESTRADIOL 1 MG PO TABS: 1.0000 mg | ORAL_TABLET | Freq: Every day | ORAL | 0 refills | Status: DC

## 2022-08-08 ENCOUNTER — Encounter: Payer: Self-pay | Admitting: Family Medicine

## 2022-08-09 ENCOUNTER — Encounter: Payer: Self-pay | Admitting: Nurse Practitioner

## 2022-08-15 ENCOUNTER — Encounter: Payer: Self-pay | Admitting: Nurse Practitioner

## 2022-08-15 ENCOUNTER — Ambulatory Visit (INDEPENDENT_AMBULATORY_CARE_PROVIDER_SITE_OTHER): Payer: BC Managed Care – PPO | Admitting: Nurse Practitioner

## 2022-08-15 VITALS — BP 112/82 | HR 98 | Ht 61.0 in | Wt 193.0 lb

## 2022-08-15 DIAGNOSIS — Z01419 Encounter for gynecological examination (general) (routine) without abnormal findings: Secondary | ICD-10-CM

## 2022-08-15 DIAGNOSIS — B3731 Acute candidiasis of vulva and vagina: Secondary | ICD-10-CM

## 2022-08-15 DIAGNOSIS — L280 Lichen simplex chronicus: Secondary | ICD-10-CM

## 2022-08-15 DIAGNOSIS — L292 Pruritus vulvae: Secondary | ICD-10-CM

## 2022-08-15 DIAGNOSIS — Z1211 Encounter for screening for malignant neoplasm of colon: Secondary | ICD-10-CM

## 2022-08-15 DIAGNOSIS — Z79890 Hormone replacement therapy (postmenopausal): Secondary | ICD-10-CM

## 2022-08-15 LAB — WET PREP FOR TRICH, YEAST, CLUE

## 2022-08-15 MED ORDER — ESTRADIOL 1 MG PO TABS: 1.0000 mg | ORAL_TABLET | Freq: Every day | ORAL | 3 refills | Status: DC

## 2022-08-15 MED ORDER — CLOBETASOL PROPIONATE 0.05 % EX OINT: 1.0000 | TOPICAL_OINTMENT | CUTANEOUS | 2 refills | Status: AC

## 2022-08-15 MED ORDER — FLUCONAZOLE 150 MG PO TABS: 150.0000 mg | ORAL_TABLET | ORAL | 0 refills | Status: DC

## 2022-08-15 NOTE — Progress Notes (Signed)
Jody Duke [birth year] MU:[id number]   History:  52 y.o. G0 presents for annual exam. Postmenopausal - on ERT. S/P 2019 TAH BSO for menorrhagia and bilateral cysts. Still having some night sweats. Has increased smoking due to stress. Treated for lichen sclerosus last year, was doing well on Clobetasol but feels it is not helping any more. Having severe external itching, denies discharge or odor. Has urge incontinence. Normal pap and mammogram history. T2DM, HTN, HLD managed by PCP.   Gynecologic History Patient's last menstrual period was 09/21/2017 (exact date).   Contraception/Family planning: status post hysterectomy Sexually active: No  Health Maintenance Last Pap: 06/16/2017. Results were: Normal Last mammogram: 06/27/2022. Results were: Normal Last colonoscopy: Never Last Dexa: Not indicated  Past medical history, past surgical history, family history and social history were all reviewed and documented in the EPIC chart. Works at [employer].  ROS:  A ROS was performed and pertinent positives and negatives are included.  Exam:  Vitals:   08/15/22 0808  BP: 112/82  Pulse: 98  SpO2: 97%  Weight: 193 lb (87.5 kg)  Height: 5' 1"$  (1.549 m)     Body mass index is 36.47 kg/m.  General appearance:  Normal Thyroid:  Symmetrical, normal in size, without palpable masses or nodularity. Respiratory  Auscultation:  Clear without wheezing or rhonchi Cardiovascular  Auscultation:  Regular rate, without rubs, murmurs or gallops  Edema/varicosities:  Not grossly evident Abdominal  Soft,nontender, without masses, guarding or rebound.  Liver/spleen:  No organomegaly noted  Hernia:  None appreciated  Skin  Inspection:  Grossly normal Breasts: Examined lying and sitting.   Right: Without masses, retractions, discharge or axillary adenopathy.   Left: Without masses, retractions, discharge or axillary adenopathy. Genitourinary   Inguinal/mons:  Normal without inguinal  adenopathy  External genitalia:  Redness, excoriation  BUS/Urethra/Skene's glands:  Normal  Vagina:  Normal appearing with normal color and discharge, no lesions  Cervix:  Absent  Uterus:  Absent  Adnexa/parametria:     Rt: Normal in size, without masses or tenderness.   Lt: Normal in size, without masses or tenderness.  Anus and perineum: Redness, excoriation  Digital rectal exam: Deferred  Patient informed chaperone available to be present for breast and pelvic exam. Patient has requested no chaperone to be present. Patient has been advised what will be completed during breast and pelvic exam.   Wet prep + yeast  Assessment/Plan:  52 y.o. G0 for annual exam.   Well woman exam with routine gynecological exam - Education provided on SBEs, importance of preventative screenings, current guidelines, high calcium diet, regular exercise, and multivitamin daily. Labs with PCP.   Hormone replacement therapy (HRT) - Plan: estradiol (ESTRACE) 1 MG tablet daily. She is aware of the risks for blood clots, stroke, heart attack, and breast cancer and her increased risk with smoking. Still having some night sweats - Taking Estradiol, Effexor, Clonidine, and Buspar. Recommend sleeping in less clothing, using fans, avoiding triggering foods, and smoking cessation. Nicotine could be cause for worsening hot flashes, highly recommend stopping. Not comfortable increasing Estradiol due to smoking status. Refill x 1 year provided.  Lichen simplex chronicus - Plan: clobetasol ointment (TEMOVATE) 0.05 % twice weekly. Applied barrier cream to help with moisture from incontinence. Avoiding scratching. Wait to restart after treating yeast infection.   Vulvar itching - Plan: WET PREP FOR [place], YEAST, CLUE. Wet prep positive for yeast.   Screening for colon cancer - Plan: Cologuard. Cologuard versus colonoscopy discussed. Average risk.  Vulvar candidiasis - Plan: fluconazole (DIFLUCAN) 150 MG tablet today and  repeat every 3 days for total of 3 doses.   Screening for cervical cancer - Normal Pap history.  No longer screening per guidelines.   Screening for breast cancer - Normal mammogram history.  Continue annual screenings.  Normal breast exam today.  Screening for osteoporosis - Smoker for 30 years. Recommend starting screenings earlier than 50.   Follow up in 1 year for annual.      Tamela Gammon [facility], 8:17 AM 08/15/2022

## 2022-08-19 ENCOUNTER — Other Ambulatory Visit: Payer: Self-pay | Admitting: *Deleted

## 2022-08-19 ENCOUNTER — Encounter: Payer: Self-pay | Admitting: Family Medicine

## 2022-08-19 MED ORDER — GABAPENTIN 600 MG PO TABS: ORAL_TABLET | ORAL | 1 refills | Status: DC

## 2022-08-26 DIAGNOSIS — M5030 Other cervical disc degeneration, unspecified cervical region: Secondary | ICD-10-CM | POA: Diagnosis not present

## 2022-08-26 DIAGNOSIS — M5136 Other intervertebral disc degeneration, lumbar region: Secondary | ICD-10-CM | POA: Diagnosis not present

## 2022-08-26 DIAGNOSIS — M47816 Spondylosis without myelopathy or radiculopathy, lumbar region: Secondary | ICD-10-CM | POA: Diagnosis not present

## 2022-08-26 DIAGNOSIS — M4802 Spinal stenosis, cervical region: Secondary | ICD-10-CM | POA: Diagnosis not present

## 2022-08-27 DIAGNOSIS — M5412 Radiculopathy, cervical region: Secondary | ICD-10-CM | POA: Diagnosis not present

## 2022-08-27 DIAGNOSIS — G894 Chronic pain syndrome: Secondary | ICD-10-CM | POA: Diagnosis not present

## 2022-08-27 DIAGNOSIS — M47812 Spondylosis without myelopathy or radiculopathy, cervical region: Secondary | ICD-10-CM | POA: Diagnosis not present

## 2022-08-27 DIAGNOSIS — M5416 Radiculopathy, lumbar region: Secondary | ICD-10-CM | POA: Diagnosis not present

## 2022-08-27 DIAGNOSIS — Z1330 Encounter for screening examination for mental health and behavioral disorders, unspecified: Secondary | ICD-10-CM | POA: Diagnosis not present

## 2022-09-11 DIAGNOSIS — M47814 Spondylosis without myelopathy or radiculopathy, thoracic region: Secondary | ICD-10-CM | POA: Diagnosis not present

## 2022-09-11 DIAGNOSIS — M4854XD Collapsed vertebra, not elsewhere classified, thoracic region, subsequent encounter for fracture with routine healing: Secondary | ICD-10-CM | POA: Diagnosis not present

## 2022-10-01 ENCOUNTER — Other Ambulatory Visit: Payer: Self-pay | Admitting: Nurse Practitioner

## 2022-10-01 ENCOUNTER — Encounter: Payer: Self-pay | Admitting: Nurse Practitioner

## 2022-10-01 DIAGNOSIS — B3731 Acute candidiasis of vulva and vagina: Secondary | ICD-10-CM

## 2022-10-01 MED ORDER — FLUCONAZOLE 150 MG PO TABS: 150.0000 mg | ORAL_TABLET | ORAL | 0 refills | Status: DC

## 2022-10-09 DIAGNOSIS — E1165 Type 2 diabetes mellitus with hyperglycemia: Secondary | ICD-10-CM | POA: Diagnosis not present

## 2022-10-09 DIAGNOSIS — Z0000 Encounter for general adult medical examination without abnormal findings: Secondary | ICD-10-CM | POA: Diagnosis not present

## 2022-10-09 DIAGNOSIS — E782 Mixed hyperlipidemia: Secondary | ICD-10-CM | POA: Diagnosis not present

## 2022-10-11 DIAGNOSIS — M545 Low back pain, unspecified: Secondary | ICD-10-CM | POA: Diagnosis not present

## 2022-10-11 DIAGNOSIS — I129 Hypertensive chronic kidney disease with stage 1 through stage 4 chronic kidney disease, or unspecified chronic kidney disease: Secondary | ICD-10-CM | POA: Diagnosis not present

## 2022-10-11 DIAGNOSIS — N182 Chronic kidney disease, stage 2 (mild): Secondary | ICD-10-CM | POA: Diagnosis not present

## 2022-10-11 DIAGNOSIS — M5416 Radiculopathy, lumbar region: Secondary | ICD-10-CM | POA: Diagnosis not present

## 2022-10-11 DIAGNOSIS — M4312 Spondylolisthesis, cervical region: Secondary | ICD-10-CM | POA: Diagnosis not present

## 2022-10-11 DIAGNOSIS — M5412 Radiculopathy, cervical region: Secondary | ICD-10-CM | POA: Diagnosis not present

## 2022-10-11 DIAGNOSIS — E1122 Type 2 diabetes mellitus with diabetic chronic kidney disease: Secondary | ICD-10-CM | POA: Diagnosis not present

## 2022-10-20 ENCOUNTER — Other Ambulatory Visit: Payer: Self-pay | Admitting: Nurse Practitioner

## 2022-10-20 DIAGNOSIS — Z79890 Hormone replacement therapy (postmenopausal): Secondary | ICD-10-CM

## 2022-10-29 ENCOUNTER — Other Ambulatory Visit: Payer: Self-pay | Admitting: Family Medicine

## 2022-11-15 ENCOUNTER — Encounter: Payer: Self-pay | Admitting: Family Medicine

## 2022-11-19 ENCOUNTER — Other Ambulatory Visit: Payer: Self-pay

## 2022-11-19 MED ORDER — GABAPENTIN 600 MG PO TABS: ORAL_TABLET | ORAL | 11 refills | Status: DC

## 2022-11-19 NOTE — Progress Notes (Signed)
Pt was unable to renew Gabapentin Rx. Spoke with rep at [facility] and they showed the Rx as invalid and they needed a new one sent. Rx sent. Pt is aware.

## 2022-11-29 DIAGNOSIS — M5412 Radiculopathy, cervical region: Secondary | ICD-10-CM | POA: Diagnosis not present

## 2022-11-29 DIAGNOSIS — N182 Chronic kidney disease, stage 2 (mild): Secondary | ICD-10-CM | POA: Diagnosis not present

## 2022-11-29 DIAGNOSIS — E669 Obesity, unspecified: Secondary | ICD-10-CM | POA: Diagnosis not present

## 2022-11-29 DIAGNOSIS — Z6837 Body mass index (BMI) 37.0-37.9, adult: Secondary | ICD-10-CM | POA: Diagnosis not present

## 2022-11-29 DIAGNOSIS — E1122 Type 2 diabetes mellitus with diabetic chronic kidney disease: Secondary | ICD-10-CM | POA: Diagnosis not present

## 2022-11-29 DIAGNOSIS — Z7984 Long term (current) use of oral hypoglycemic drugs: Secondary | ICD-10-CM | POA: Diagnosis not present

## 2022-11-29 DIAGNOSIS — M545 Low back pain, unspecified: Secondary | ICD-10-CM | POA: Diagnosis not present

## 2022-11-29 DIAGNOSIS — I129 Hypertensive chronic kidney disease with stage 1 through stage 4 chronic kidney disease, or unspecified chronic kidney disease: Secondary | ICD-10-CM | POA: Diagnosis not present

## 2022-11-29 DIAGNOSIS — Z79890 Hormone replacement therapy (postmenopausal): Secondary | ICD-10-CM | POA: Diagnosis not present

## 2022-11-29 DIAGNOSIS — Z79899 Other long term (current) drug therapy: Secondary | ICD-10-CM | POA: Diagnosis not present

## 2022-11-29 DIAGNOSIS — M5416 Radiculopathy, lumbar region: Secondary | ICD-10-CM | POA: Diagnosis not present

## 2022-12-09 DIAGNOSIS — M5416 Radiculopathy, lumbar region: Secondary | ICD-10-CM | POA: Diagnosis not present

## 2023-01-08 DIAGNOSIS — M7918 Myalgia, other site: Secondary | ICD-10-CM | POA: Diagnosis not present

## 2023-02-05 DIAGNOSIS — G8929 Other chronic pain: Secondary | ICD-10-CM | POA: Diagnosis not present

## 2023-02-05 DIAGNOSIS — Z9689 Presence of other specified functional implants: Secondary | ICD-10-CM | POA: Diagnosis not present

## 2023-02-05 DIAGNOSIS — M545 Low back pain, unspecified: Secondary | ICD-10-CM | POA: Diagnosis not present

## 2023-02-05 DIAGNOSIS — M7918 Myalgia, other site: Secondary | ICD-10-CM | POA: Diagnosis not present

## 2023-02-14 DIAGNOSIS — M5412 Radiculopathy, cervical region: Secondary | ICD-10-CM | POA: Diagnosis not present

## 2023-02-14 DIAGNOSIS — R809 Proteinuria, unspecified: Secondary | ICD-10-CM | POA: Diagnosis not present

## 2023-02-14 DIAGNOSIS — Z0000 Encounter for general adult medical examination without abnormal findings: Secondary | ICD-10-CM | POA: Diagnosis not present

## 2023-02-14 DIAGNOSIS — L84 Corns and callosities: Secondary | ICD-10-CM | POA: Diagnosis not present

## 2023-02-14 DIAGNOSIS — Z720 Tobacco use: Secondary | ICD-10-CM | POA: Diagnosis not present

## 2023-02-14 DIAGNOSIS — Z23 Encounter for immunization: Secondary | ICD-10-CM | POA: Diagnosis not present

## 2023-02-14 DIAGNOSIS — I152 Hypertension secondary to endocrine disorders: Secondary | ICD-10-CM | POA: Diagnosis not present

## 2023-02-14 DIAGNOSIS — E1159 Type 2 diabetes mellitus with other circulatory complications: Secondary | ICD-10-CM | POA: Diagnosis not present

## 2023-02-14 DIAGNOSIS — E1129 Type 2 diabetes mellitus with other diabetic kidney complication: Secondary | ICD-10-CM | POA: Diagnosis not present

## 2023-02-17 DIAGNOSIS — M47816 Spondylosis without myelopathy or radiculopathy, lumbar region: Secondary | ICD-10-CM | POA: Diagnosis not present

## 2023-02-17 DIAGNOSIS — M47817 Spondylosis without myelopathy or radiculopathy, lumbosacral region: Secondary | ICD-10-CM | POA: Diagnosis not present

## 2023-02-17 DIAGNOSIS — M5127 Other intervertebral disc displacement, lumbosacral region: Secondary | ICD-10-CM | POA: Diagnosis not present

## 2023-02-17 DIAGNOSIS — M48061 Spinal stenosis, lumbar region without neurogenic claudication: Secondary | ICD-10-CM | POA: Diagnosis not present

## 2023-02-17 DIAGNOSIS — M5126 Other intervertebral disc displacement, lumbar region: Secondary | ICD-10-CM | POA: Diagnosis not present

## 2023-03-21 DIAGNOSIS — F419 Anxiety disorder, unspecified: Secondary | ICD-10-CM | POA: Diagnosis not present

## 2023-03-21 DIAGNOSIS — R809 Proteinuria, unspecified: Secondary | ICD-10-CM | POA: Diagnosis not present

## 2023-03-21 DIAGNOSIS — E1129 Type 2 diabetes mellitus with other diabetic kidney complication: Secondary | ICD-10-CM | POA: Diagnosis not present

## 2023-03-21 DIAGNOSIS — Z1211 Encounter for screening for malignant neoplasm of colon: Secondary | ICD-10-CM | POA: Diagnosis not present

## 2023-04-28 ENCOUNTER — Encounter: Payer: Self-pay | Admitting: Physician Assistant

## 2023-05-26 DIAGNOSIS — R809 Proteinuria, unspecified: Secondary | ICD-10-CM | POA: Diagnosis not present

## 2023-05-26 DIAGNOSIS — M5412 Radiculopathy, cervical region: Secondary | ICD-10-CM | POA: Diagnosis not present

## 2023-05-26 DIAGNOSIS — E1129 Type 2 diabetes mellitus with other diabetic kidney complication: Secondary | ICD-10-CM | POA: Diagnosis not present

## 2023-05-26 DIAGNOSIS — Z9689 Presence of other specified functional implants: Secondary | ICD-10-CM | POA: Diagnosis not present

## 2023-06-09 DIAGNOSIS — E1129 Type 2 diabetes mellitus with other diabetic kidney complication: Secondary | ICD-10-CM | POA: Diagnosis not present

## 2023-06-09 DIAGNOSIS — M5412 Radiculopathy, cervical region: Secondary | ICD-10-CM | POA: Diagnosis not present

## 2023-06-09 DIAGNOSIS — R809 Proteinuria, unspecified: Secondary | ICD-10-CM | POA: Diagnosis not present

## 2023-06-26 DIAGNOSIS — E1159 Type 2 diabetes mellitus with other circulatory complications: Secondary | ICD-10-CM | POA: Diagnosis not present

## 2023-06-26 DIAGNOSIS — E1129 Type 2 diabetes mellitus with other diabetic kidney complication: Secondary | ICD-10-CM | POA: Diagnosis not present

## 2023-06-26 DIAGNOSIS — Z1231 Encounter for screening mammogram for malignant neoplasm of breast: Secondary | ICD-10-CM | POA: Diagnosis not present

## 2023-06-26 DIAGNOSIS — E1169 Type 2 diabetes mellitus with other specified complication: Secondary | ICD-10-CM | POA: Diagnosis not present

## 2023-06-26 DIAGNOSIS — I152 Hypertension secondary to endocrine disorders: Secondary | ICD-10-CM | POA: Diagnosis not present

## 2023-06-26 DIAGNOSIS — R809 Proteinuria, unspecified: Secondary | ICD-10-CM | POA: Diagnosis not present

## 2023-08-27 ENCOUNTER — Ambulatory Visit: Payer: BC Managed Care – PPO | Admitting: Nurse Practitioner

## 2023-08-27 ENCOUNTER — Encounter: Payer: Self-pay | Admitting: Nurse Practitioner

## 2023-08-27 DIAGNOSIS — Z79890 Hormone replacement therapy (postmenopausal): Secondary | ICD-10-CM

## 2023-08-27 MED ORDER — ESTRADIOL 1 MG PO TABS: 1.0000 mg | ORAL_TABLET | Freq: Every day | ORAL | 0 refills | Status: DC

## 2023-08-27 NOTE — Telephone Encounter (Signed)
 Med refill request: HRT Last AEX: 08/15/2022-TW Next AEX: 11/12/2023-TW Last MMG (if hormonal med): 06/27/2022-WNL Refill authorized: rx pend.

## 2023-08-27 NOTE — Progress Notes (Deleted)
 Jody Duke 51-Mar-1972 [id number]   History:  53 y.o. G0 presents for annual exam. Postmenopausal - on ERT. S/P 2019 TAH BSO for menorrhagia and bilateral cysts. Still having some night sweats. Has increased smoking due to stress. Clobetasol for lichen sclerosus. Normal pap and mammogram history. T2DM, HTN, HLD managed by PCP.   Gynecologic History Patient's last menstrual period was 09/21/2017 (exact date).   Contraception/Family planning: status post hysterectomy Sexually active: No  Health Maintenance Last Pap: 06/16/2017. Results were: Normal Last mammogram: 06/27/2022. Results were: Normal Last colonoscopy: Never Last Dexa: Not indicated  Past medical history, past surgical history, family history and social history were all reviewed and documented in the EPIC chart. Works at [employer].  ROS:  A ROS was performed and pertinent positives and negatives are included.  Exam:  There were no vitals filed for this visit.    There is no height or weight on file to calculate BMI.  General appearance:  Normal Thyroid:  Symmetrical, normal in size, without palpable masses or nodularity. Respiratory  Auscultation:  Clear without wheezing or rhonchi Cardiovascular  Auscultation:  Regular rate, without rubs, murmurs or gallops  Edema/varicosities:  Not grossly evident Abdominal  Soft,nontender, without masses, guarding or rebound.  Liver/spleen:  No organomegaly noted  Hernia:  None appreciated  Skin  Inspection:  Grossly normal Breasts: Examined lying and sitting.   Right: Without masses, retractions, discharge or axillary adenopathy.   Left: Without masses, retractions, discharge or axillary adenopathy. Pelvic: External genitalia:  no lesions              Urethra:  normal appearing urethra with no masses, tenderness or lesions              Bartholins and Skenes: normal                 Vagina: normal appearing vagina with normal color and discharge, no lesions               Cervix: absent Bimanual Exam:  Uterus:  absent              Adnexa: no mass, fullness, tenderness              Rectovaginal: Deferred              Anus:  normal, no lesions  Patient informed chaperone available to be present for breast and pelvic exam. Patient has requested no chaperone to be present. Patient has been advised what will be completed during breast and pelvic exam.   Assessment/Plan:  53 y.o. G0 for annual exam.   Well woman exam with routine gynecological exam - Education provided on SBEs, importance of preventative screenings, current guidelines, high calcium diet, regular exercise, and multivitamin daily. Labs with PCP.   Hormone replacement therapy (HRT) - Plan: estradiol (ESTRACE) 1 MG tablet daily. She is aware of the risks for blood clots, stroke, heart attack, and breast cancer and her increased risk with smoking. Still having some night sweats - Taking Estradiol, Effexor, Clonidine, and Buspar. Recommend sleeping in less clothing, using fans, avoiding triggering foods, and smoking cessation. Nicotine could be cause for worsening hot flashes, highly recommend stopping. Not comfortable increasing Estradiol due to smoking status. Refill x 1 year provided.  Lichen simplex chronicus - Plan: clobetasol ointment (TEMOVATE) 0.05 % twice weekly. Applied barrier cream to help with moisture from incontinence. Avoiding scratching. Wait to restart after treating yeast infection.   Screening for colon cancer -  Plan: Cologuard. Cologuard versus colonoscopy discussed. Average risk.   Screening for cervical cancer - Normal Pap history.  No longer screening per guidelines.   Screening for breast cancer - Normal mammogram history.  Continue annual screenings.  Normal breast exam today.  Screening for osteoporosis - Smoker for 30 years. Recommend starting screenings earlier than 65.   No follow-ups on file.     Olivia Mackie [facility], 8:05 AM 08/27/2023

## 2023-11-12 ENCOUNTER — Ambulatory Visit: Payer: BC Managed Care – PPO | Admitting: Nurse Practitioner

## 2023-11-12 NOTE — Progress Notes (Deleted)
 Jody Duke 01/03/1971 [id number]   History:  53 y.o. G0 presents for annual exam. Postmenopausal - on ERT. S/P 2019 TAH BSO for menorrhagia and bilateral cysts. Still having some night sweats. Has increased smoking due to stress. Lichen sclerosus. Normal pap and mammogram history. T2DM, HTN, HLD managed by PCP.   Gynecologic History Patient's last menstrual period was 09/21/2017 (exact date).   Contraception/Family planning: status post hysterectomy Sexually active: No  Health Maintenance Last Pap: 06/16/2017. Results were: Normal Last mammogram: 06/27/2022. Results were: Normal Last colonoscopy: Never Last Dexa: Not indicated  Past medical history, past surgical history, family history and social history were all reviewed and documented in the EPIC chart. Works at [employer].  ROS:  A ROS was performed and pertinent positives and negatives are included.  Exam:  There were no vitals filed for this visit.    There is no height or weight on file to calculate BMI.  General appearance:  Normal Thyroid :  Symmetrical, normal in size, without palpable masses or nodularity. Respiratory  Auscultation:  Clear without wheezing or rhonchi Cardiovascular  Auscultation:  Regular rate, without rubs, murmurs or gallops  Edema/varicosities:  Not grossly evident Abdominal  Soft,nontender, without masses, guarding or rebound.  Liver/spleen:  No organomegaly noted  Hernia:  None appreciated  Skin  Inspection:  Grossly normal Breasts: Examined lying and sitting.   Right: Without masses, retractions, discharge or axillary adenopathy.   Left: Without masses, retractions, discharge or axillary adenopathy. Pelvic: External genitalia:  no lesions              Urethra:  normal appearing urethra with no masses, tenderness or lesions              Bartholins and Skenes: normal                 Vagina: normal appearing vagina with normal color and discharge, no lesions              Cervix:  absent Bimanual Exam:  Uterus:  absent              Adnexa: no mass, fullness, tenderness              Rectovaginal: Deferred              Anus:  normal, no lesions  Patient informed chaperone available to be present for breast and pelvic exam. Patient has requested no chaperone to be present. Patient has been advised what will be completed during breast and pelvic exam.   Assessment/Plan:  53 y.o. G0 for annual exam.   Well woman exam with routine gynecological exam - Education provided on SBEs, importance of preventative screenings, current guidelines, high calcium  diet, regular exercise, and multivitamin daily. Labs with PCP.   Hormone replacement therapy (HRT) - Plan: estradiol  (ESTRACE ) 1 MG tablet daily. She is aware of the risks for blood clots, stroke, heart attack, and breast cancer and her increased risk with smoking. Still having some night sweats - Taking Estradiol , Effexor , Clonidine, and Buspar . Recommend sleeping in less clothing, using fans, avoiding triggering foods, and smoking cessation. Nicotine  could be cause for worsening hot flashes, highly recommend stopping. Not comfortable increasing Estradiol  due to smoking status. Refill x 1 year provided.  Lichen simplex chronicus - Plan: clobetasol  ointment (TEMOVATE ) 0.05 % twice weekly. Applied barrier cream to help with moisture from incontinence. Avoiding scratching. Wait to restart after treating yeast infection.   Screening for colon cancer - Plan: Cologuard.  Cologuard versus colonoscopy discussed. Average risk.   Screening for cervical cancer - Normal Pap history.  No longer screening per guidelines.   Screening for breast cancer - Normal mammogram history.  Continue annual screenings.  Normal breast exam today.  Screening for osteoporosis - Smoker for 30 years. Recommend starting screenings earlier than 65.   No follow-ups on file.     Andee Bamberger [facility], 9:12 AM 11/12/2023

## 2023-11-22 ENCOUNTER — Other Ambulatory Visit: Payer: Self-pay | Admitting: Nurse Practitioner

## 2023-11-22 DIAGNOSIS — Z79890 Hormone replacement therapy (postmenopausal): Secondary | ICD-10-CM

## 2023-11-25 NOTE — Telephone Encounter (Signed)
 Med refill request: Estrace  tablet Last AEX: 08/15/22 TW Next AEX: not scheduled, sent message to front desk to schedule annual visit. Last MMG (if hormonal med) 06/27/22 Refill authorized: Last Rx sent #90 tablet with zero refills on 08/27/23 TW. Please approve or deny as appropriate.

## 2023-12-25 ENCOUNTER — Telehealth: Payer: Self-pay | Admitting: *Deleted

## 2023-12-25 NOTE — Telephone Encounter (Signed)
 Patient left message requesting refill of estradiol  1 mg estradiol  tab.   Spoke with patient, advised Rx sent on 11/25/23 for 90 day supply, 0RF. Patient states she has not contacted the pharmacy directly. Advised to contact pharmacy, if only filled for 30 day supply you should have 2 refills for 30 days supply remaining. Return call if any additional assistance is needed.   Routing to provider for final review. Patient is agreeable to disposition. Will close encounter.

## 2023-12-30 ENCOUNTER — Encounter: Payer: Self-pay | Admitting: Nurse Practitioner

## 2024-02-24 NOTE — Progress Notes (Unsigned)
 Jody Duke September 22, 1970 [id number]   History:  53 y.o. G0 presents for annual exam. Postmenopausal - on ERT. S/P 2019 TAH BSO for menorrhagia and bilateral cysts. Still having some night sweats. Has increased smoking due to stress. Lichen sclerosus managed with Clobetasol . Normal pap and mammogram history. T2DM, HTN, HLD managed by PCP.   Gynecologic History Patient's last menstrual period was 09/21/2017 (exact date).   Contraception/Family planning: status post hysterectomy Sexually active: No  Health Maintenance Last Pap: 06/16/2017. Results were: Normal Last mammogram: 06/27/2022. Results were: Normal Last colonoscopy: Never Last Dexa: Not indicated  Past medical history, past surgical history, family history and social history were all reviewed and documented in the EPIC chart. Works at [employer].  ROS:  A ROS was performed and pertinent positives and negatives are included.  Exam:  There were no vitals filed for this visit.    There is no height or weight on file to calculate BMI.  General appearance:  Normal Thyroid :  Symmetrical, normal in size, without palpable masses or nodularity. Respiratory  Auscultation:  Clear without wheezing or rhonchi Cardiovascular  Auscultation:  Regular rate, without rubs, murmurs or gallops  Edema/varicosities:  Not grossly evident Abdominal  Soft,nontender, without masses, guarding or rebound.  Liver/spleen:  No organomegaly noted  Hernia:  None appreciated  Skin  Inspection:  Grossly normal Breasts: Examined lying and sitting.   Right: Without masses, retractions, discharge or axillary adenopathy.   Left: Without masses, retractions, discharge or axillary adenopathy. Genitourinary   Inguinal/mons:  Normal without inguinal adenopathy  External genitalia:  Redness, excoriation  BUS/Urethra/Skene's glands:  Normal  Vagina:  Normal appearing with normal color and discharge, no lesions  Cervix:  Absent  Uterus:   Absent  Adnexa/parametria:     Rt: Normal in size, without masses or tenderness.   Lt: Normal in size, without masses or tenderness.  Anus and perineum: Redness, excoriation  Digital rectal exam: Deferred  Patient informed chaperone available to be present for breast and pelvic exam. Patient has requested no chaperone to be present. Patient has been advised what will be completed during breast and pelvic exam.   Assessment/Plan:  53 y.o. G0 for annual exam.   Well woman exam with routine gynecological exam - Education provided on SBEs, importance of preventative screenings, current guidelines, high calcium  diet, regular exercise, and multivitamin daily. Labs with PCP.   Hormone replacement therapy (HRT) - Plan: estradiol  (ESTRACE ) 1 MG tablet daily. She is aware of the risks for blood clots, stroke, heart attack, and breast cancer and her increased risk with smoking. Still having some night sweats - Taking Estradiol , Effexor , Clonidine, and Buspar . Recommend sleeping in less clothing, using fans, avoiding triggering foods, and smoking cessation. Nicotine  could be cause for worsening hot flashes, highly recommend stopping. Not comfortable increasing Estradiol  due to smoking status. Refill x 1 year provided.  Lichen simplex chronicus - Plan: clobetasol  ointment (TEMOVATE ) 0.05 % twice weekly. Applied barrier cream to help with moisture from incontinence. Avoiding scratching. Wait to restart after treating yeast infection.   Screening for colon cancer - Plan: Cologuard. Cologuard versus colonoscopy discussed. Average risk.   Screening for cervical cancer - Normal Pap history.  No longer screening per guidelines.   Screening for breast cancer - Normal mammogram history.  Continue annual screenings.  Normal breast exam today.  Screening for osteoporosis - Smoker for 30 years. Recommend starting screenings earlier than 65.   No follow-ups on file.      Jody Duke  A Cirilo Canner [facility], 3:00 PM  02/24/2024

## 2024-02-25 ENCOUNTER — Encounter: Payer: Self-pay | Admitting: Nurse Practitioner

## 2024-02-25 ENCOUNTER — Ambulatory Visit (INDEPENDENT_AMBULATORY_CARE_PROVIDER_SITE_OTHER): Payer: Self-pay | Admitting: Nurse Practitioner

## 2024-02-25 VITALS — BP 114/70 | HR 80 | Ht 61.75 in | Wt 179.0 lb

## 2024-02-25 DIAGNOSIS — Z01419 Encounter for gynecological examination (general) (routine) without abnormal findings: Secondary | ICD-10-CM

## 2024-02-25 DIAGNOSIS — Z79890 Hormone replacement therapy (postmenopausal): Secondary | ICD-10-CM

## 2024-02-25 DIAGNOSIS — Z1211 Encounter for screening for malignant neoplasm of colon: Secondary | ICD-10-CM

## 2024-02-25 DIAGNOSIS — Z1331 Encounter for screening for depression: Secondary | ICD-10-CM | POA: Diagnosis not present

## 2024-02-25 MED ORDER — ESTRADIOL 1 MG PO TABS: 1.0000 mg | ORAL_TABLET | Freq: Every day | ORAL | 3 refills | Status: DC

## 2024-03-28 ENCOUNTER — Other Ambulatory Visit: Payer: Self-pay | Admitting: Nurse Practitioner

## 2024-03-28 DIAGNOSIS — Z79890 Hormone replacement therapy (postmenopausal): Secondary | ICD-10-CM

## 2024-03-29 NOTE — Telephone Encounter (Signed)
 Med refill request: ESTRACE   Last AEX: 02/25/24 Next AEX: 03/02/25 Last MMG (if hormonal med) 08/12/23 BIRADS Cat 2 benign  Refill authorized: patient now requesting medication be sent to [facility] instead of [facility]. Please advise. Last rx 02/25/24 #90 with 3 refills. Please approve or deny

## 2024-03-31 ENCOUNTER — Encounter: Payer: Self-pay | Admitting: Nurse Practitioner

## 2024-03-31 NOTE — Telephone Encounter (Signed)
 Spoke with patient. Patient reports increased vulvar itching with white clumpy vaginal discharge. Denies odor or bleeding. Increased pressure when voiding. Skin burns when urine touches the skin. Has been using temovate  ointment with no relief. Requesting PM OV this week or AM OV next week. OV scheduled for 10/7 at 1200 with Tiffany. Advised I will send update to Tiffany, our office will call with any additional recommendations. Patient agreeable.   Routing to [employer] for review.

## 2024-04-06 ENCOUNTER — Ambulatory Visit: Admitting: Nurse Practitioner

## 2024-04-06 VITALS — BP 116/72 | HR 88 | Wt 181.2 lb

## 2024-04-06 DIAGNOSIS — B3731 Acute candidiasis of vulva and vagina: Secondary | ICD-10-CM | POA: Diagnosis not present

## 2024-04-06 DIAGNOSIS — R300 Dysuria: Secondary | ICD-10-CM | POA: Diagnosis not present

## 2024-04-06 DIAGNOSIS — N898 Other specified noninflammatory disorders of vagina: Secondary | ICD-10-CM

## 2024-04-06 LAB — WET PREP FOR TRICH, YEAST, CLUE

## 2024-04-06 MED ORDER — FLUCONAZOLE 150 MG PO TABS: 150.0000 mg | ORAL_TABLET | ORAL | 0 refills | Status: AC

## 2024-04-06 NOTE — Progress Notes (Signed)
   Acute Office Visit  Subjective:    Patient ID: Jody Duke, female    DOB: [birth year], 53 y.o.   MRN: [id number]   HPI 53 y.o. presents today for vaginal itching and white, chunky discharge x 2 weeks. Tried OTC monistat the last 2 nights. Little improvement. Stepdaughter present.   Patient's last menstrual period was 09/21/2017 (exact date).    Review of Systems  Constitutional: Negative.   Genitourinary:  Positive for dysuria, vaginal discharge and vaginal pain (Itching). Negative for flank pain, frequency, genital sores, hematuria and urgency.       Objective:    Physical Exam Constitutional:      Appearance: Normal appearance.  Genitourinary:    Labia:        Right: Rash present.        Left: Rash present.      Vagina: Normal. Vaginal discharge: Unable to assess d/t cream.     Comments: Generalized external redness    BP 116/72   Pulse 88   Wt 181 lb 3.2 oz (82.2 kg)   LMP 09/21/2017 (Exact Date)   SpO2 99%   BMI 33.41 kg/m  Wt Readings from Last 3 Encounters:  04/06/24 181 lb 3.2 oz (82.2 kg)  02/25/24 179 lb (81.2 kg)  08/15/22 193 lb (87.5 kg)        Wet prep showed cream only  UA: neg leukocytes, neg nitrites, 2+ protein, trac blood, orange/cloudy. Microscopic: wbc 0-5, rbc 0-2, moderate bacteria, 1-3 hyaline casts  Assessment & Plan:   Problem List Items Addressed This Visit   None Visit Diagnoses       Vulvovaginal candidiasis    -  Primary   Relevant Medications   fluconazole  (DIFLUCAN ) 150 MG tablet     Dysuria       Relevant Orders   Urinalysis,Complete w/RFL Culture     Vaginal itching       Relevant Orders   WET PREP FOR TRICH, YEAST, CLUE      Plan: Unable to do wet prep d/t amount of cream present. Exam consistent with yeast. Diflucan  150 mg every 3 days x 2 doses. UA unremarkable, culture pending.   Return if symptoms worsen or fail to improve.    Annabella DELENA Shutter DNP, 12:27 PM 04/06/2024

## 2024-04-07 LAB — URINALYSIS, COMPLETE W/RFL CULTURE
Glucose, UA: NEGATIVE
Leukocyte Esterase: NEGATIVE
Nitrites, Initial: NEGATIVE
Specific Gravity, Urine: 1.027 (ref 1.001–1.035)
pH: 5.5 (ref 5.0–8.0)

## 2024-04-07 LAB — URINE CULTURE
MICRO NUMBER:: 17066954
SPECIMEN QUALITY:: ADEQUATE

## 2024-04-07 LAB — CULTURE INDICATED

## 2024-04-08 ENCOUNTER — Ambulatory Visit: Payer: Self-pay | Admitting: Nurse Practitioner

## 2024-06-08 ENCOUNTER — Other Ambulatory Visit: Payer: Self-pay

## 2024-06-08 DIAGNOSIS — Z79890 Hormone replacement therapy (postmenopausal): Secondary | ICD-10-CM

## 2024-06-08 NOTE — Telephone Encounter (Signed)
 Patient has new mail order pharmacy:  [facility] and desires RX for: Med refill request: estradiol  1 mg  Last AEX: 02/25/24 Next AEX: 03/02/25 Last MMG (if hormonal med) 08/12/23 BI-RADS 2 benign Refill sent to provider for approval or denial.

## 2024-06-09 MED ORDER — ESTRADIOL 1 MG PO TABS: 1.0000 mg | ORAL_TABLET | Freq: Every day | ORAL | 2 refills | Status: AC

## 2025-03-02 ENCOUNTER — Ambulatory Visit: Admitting: Nurse Practitioner
